# Patient Record
Sex: Male | Born: 1955 | Race: White | Hispanic: No | Smoking: Current every day smoker
Health system: Southern US, Community
[De-identification: ages and names within clinical notes are randomized; demographics above are authoritative.]

## PROBLEM LIST (undated history)

## (undated) DIAGNOSIS — I219 Acute myocardial infarction, unspecified: Secondary | ICD-10-CM

## (undated) DIAGNOSIS — N529 Male erectile dysfunction, unspecified: Secondary | ICD-10-CM

## (undated) DIAGNOSIS — I10 Essential (primary) hypertension: Secondary | ICD-10-CM

## (undated) DIAGNOSIS — M519 Unspecified thoracic, thoracolumbar and lumbosacral intervertebral disc disorder: Secondary | ICD-10-CM

## (undated) DIAGNOSIS — J449 Chronic obstructive pulmonary disease, unspecified: Secondary | ICD-10-CM

## (undated) DIAGNOSIS — Z227 Latent tuberculosis: Secondary | ICD-10-CM

## (undated) HISTORY — DX: Unspecified thoracic, thoracolumbar and lumbosacral intervertebral disc disorder: M51.9

## (undated) HISTORY — DX: Chronic obstructive pulmonary disease, unspecified: J44.9

## (undated) HISTORY — DX: Male erectile dysfunction, unspecified: N52.9

## (undated) HISTORY — DX: Essential (primary) hypertension: I10

## (undated) HISTORY — PX: APPENDECTOMY: SHX54

---

## 2005-05-18 ENCOUNTER — Emergency Department: Payer: Self-pay | Admitting: Emergency Medicine

## 2005-08-18 ENCOUNTER — Ambulatory Visit: Payer: Self-pay | Admitting: Specialist

## 2005-10-01 ENCOUNTER — Ambulatory Visit: Payer: Self-pay | Admitting: Anesthesiology

## 2005-10-08 ENCOUNTER — Ambulatory Visit: Payer: Self-pay | Admitting: Anesthesiology

## 2005-12-09 ENCOUNTER — Ambulatory Visit: Payer: Self-pay | Admitting: Anesthesiology

## 2006-01-26 ENCOUNTER — Ambulatory Visit: Payer: Self-pay | Admitting: Anesthesiology

## 2006-02-23 ENCOUNTER — Ambulatory Visit: Payer: Self-pay | Admitting: Anesthesiology

## 2006-03-30 ENCOUNTER — Ambulatory Visit: Payer: Self-pay | Admitting: Anesthesiology

## 2006-07-20 ENCOUNTER — Ambulatory Visit: Payer: Self-pay | Admitting: Anesthesiology

## 2007-09-01 ENCOUNTER — Ambulatory Visit: Payer: Self-pay | Admitting: Internal Medicine

## 2007-09-01 DIAGNOSIS — R55 Syncope and collapse: Secondary | ICD-10-CM | POA: Insufficient documentation

## 2007-09-01 DIAGNOSIS — J449 Chronic obstructive pulmonary disease, unspecified: Secondary | ICD-10-CM | POA: Insufficient documentation

## 2007-09-01 DIAGNOSIS — N529 Male erectile dysfunction, unspecified: Secondary | ICD-10-CM | POA: Insufficient documentation

## 2007-09-01 DIAGNOSIS — M5137 Other intervertebral disc degeneration, lumbosacral region: Secondary | ICD-10-CM | POA: Insufficient documentation

## 2007-09-01 DIAGNOSIS — R079 Chest pain, unspecified: Secondary | ICD-10-CM | POA: Insufficient documentation

## 2007-09-01 DIAGNOSIS — R42 Dizziness and giddiness: Secondary | ICD-10-CM | POA: Insufficient documentation

## 2007-09-02 LAB — CONVERTED CEMR LAB
AST: 26 units/L (ref 0–37)
BUN: 13 mg/dL (ref 6–23)
Basophils Absolute: 0.1 10*3/uL (ref 0.0–0.1)
Bilirubin, Direct: 0.1 mg/dL (ref 0.0–0.3)
Calcium: 9.5 mg/dL (ref 8.4–10.5)
Creatinine, Ser: 1 mg/dL (ref 0.4–1.5)
GFR calc non Af Amer: 85 mL/min
HCT: 44.3 % (ref 39.0–52.0)
Hemoglobin: 15.5 g/dL (ref 13.0–17.0)
Lymphocytes Relative: 31.2 % (ref 12.0–46.0)
MCHC: 35 g/dL (ref 30.0–36.0)
Monocytes Absolute: 0.8 10*3/uL — ABNORMAL HIGH (ref 0.2–0.7)
Monocytes Relative: 9.4 % (ref 3.0–11.0)
Neutro Abs: 4.8 10*3/uL (ref 1.4–7.7)
Neutrophils Relative %: 57.2 % (ref 43.0–77.0)
Phosphorus: 4.1 mg/dL (ref 2.3–4.6)
Potassium: 4.7 meq/L (ref 3.5–5.1)
RDW: 12.8 % (ref 11.5–14.6)
TSH: 0.85 microintl units/mL (ref 0.35–5.50)
Total Bilirubin: 0.7 mg/dL (ref 0.3–1.2)

## 2007-09-08 ENCOUNTER — Encounter: Payer: Self-pay | Admitting: Internal Medicine

## 2007-09-08 ENCOUNTER — Ambulatory Visit: Payer: Self-pay

## 2007-09-22 ENCOUNTER — Telehealth: Payer: Self-pay | Admitting: Internal Medicine

## 2008-02-01 ENCOUNTER — Ambulatory Visit: Payer: Self-pay | Admitting: Internal Medicine

## 2008-02-02 LAB — CONVERTED CEMR LAB
Prolactin: 3.2 ng/mL
Testosterone-% Free: 1.7 % (ref 1.6–2.9)

## 2008-02-20 ENCOUNTER — Encounter: Payer: Self-pay | Admitting: Internal Medicine

## 2011-06-09 ENCOUNTER — Encounter: Payer: Self-pay | Admitting: Internal Medicine

## 2011-06-10 ENCOUNTER — Encounter: Payer: Self-pay | Admitting: Internal Medicine

## 2011-06-10 ENCOUNTER — Ambulatory Visit (INDEPENDENT_AMBULATORY_CARE_PROVIDER_SITE_OTHER): Payer: PRIVATE HEALTH INSURANCE | Admitting: Internal Medicine

## 2011-06-10 VITALS — BP 130/80 | HR 66 | Temp 98.5°F | Ht 66.0 in | Wt 188.0 lb

## 2011-06-10 DIAGNOSIS — M5416 Radiculopathy, lumbar region: Secondary | ICD-10-CM | POA: Insufficient documentation

## 2011-06-10 DIAGNOSIS — IMO0002 Reserved for concepts with insufficient information to code with codable children: Secondary | ICD-10-CM

## 2011-06-10 MED ORDER — MELOXICAM 15 MG PO TABS: 15.0000 mg | ORAL_TABLET | Freq: Every day | ORAL | Status: AC

## 2011-06-10 MED ORDER — PREDNISONE 20 MG PO TABS: 40.0000 mg | ORAL_TABLET | Freq: Every day | ORAL | Status: AC

## 2011-06-10 NOTE — Patient Instructions (Signed)
Please get records from [facility] from about 5 years ago---back MRI and pain clinic Please call for referral if back is not better in 2-3 weeks

## 2011-06-10 NOTE — Progress Notes (Signed)
  Subjective:    Patient ID: Kurt [name], male    DOB: January 07, 1956, 55 y.o.   MRN: [id number]  HPI Injured back about 1 month ago--twisted wrong at work Seen at [facility] by Dr Lowry Bowl Similar injury 5 years ago--he needed epidural shots Norflex,lodine and hydrocodone Rx---used up the lodine and stopped others  Now with ongoing pain after just getting out of bed Can be severe and "drop me"---centered in low spine and then to left leg This may ease up but he has ongoing pain during the day and he has to be careful Uses back brace at times  Sleeps okay Able to work  occ weakness in left leg---may be walking and loses sensation and has to be careful. Not true weakness  No current outpatient prescriptions on file prior to visit.    No Known Allergies  Past Medical History  Diagnosis Date  . Lumbar disc disease   . COPD (chronic obstructive pulmonary disease)   . ED (erectile dysfunction)     Past Surgical History  Procedure Date  . Appendectomy     Family History  Problem Relation Age of Onset  . Hyperlipidemia Father   . Cancer Father   . Hypertension Neg Hx   . Diabetes Neg Hx     History   Social History  . Marital Status: Married    Spouse Name: N/A    Number of Children: 1  . Years of Education: N/A   Occupational History  . 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 Smoking status: Current Everyday Smoker  . Smokeless tobacco: Never Used  . Alcohol Use: Yes  . Drug Use: No  . Sexually Active: Not on file   Other Topics Concern  . Not on file   Social History Narrative  . No narrative on file   Review of Systems No bladder problems Some constipation on the meds--took stool softeners for a while    Objective:   Physical Exam  Constitutional: He appears well-developed and well-nourished. No distress.  Musculoskeletal:       Tenderness ~L4 over spine No hip tenderness SLR positive on both sides at ~45  degrees No sig decrease in ROM at hips Very limited back flexion  Neurological:       Slight weakness in knee extenders on left (may be pain related) Reflexes mixed---left knee slightly decreased but ankle is increased compared to right          Assessment & Plan:

## 2011-06-10 NOTE — Assessment & Plan Note (Signed)
Known severe disc disease in lumbar spine MRI and then epidurals at [facility] in past---I will try to review these records PE suggests nerve impingement but he is still functional  P: will restart NSAIDS--meloxicam      Trial of prednisone      Consider referral back for epidurals if not better

## 2013-02-26 ENCOUNTER — Emergency Department: Payer: Self-pay | Admitting: Emergency Medicine

## 2013-02-26 LAB — CBC
HGB: 14.9 g/dL (ref 13.0–18.0)
MCV: 95 fL (ref 80–100)
Platelet: 204 10*3/uL (ref 150–440)
RBC: 4.67 10*6/uL (ref 4.40–5.90)
RDW: 13.2 % (ref 11.5–14.5)

## 2013-02-26 LAB — TROPONIN I: Troponin-I: <0.02 ng/mL

## 2013-02-26 LAB — BASIC METABOLIC PANEL
Anion Gap: 6 — ABNORMAL LOW (ref 7–16)
BUN: 13 mg/dL (ref 7–18)
Calcium, Total: 8.5 mg/dL (ref 8.5–10.1)
Chloride: 105 mmol/L (ref 98–107)
Creatinine: 0.96 mg/dL (ref 0.60–1.30)
EGFR (Non-African Amer.): >60
Osmolality: 276 (ref 275–301)
Sodium: 138 mmol/L (ref 136–145)

## 2013-03-22 ENCOUNTER — Encounter: Payer: Self-pay | Admitting: Internal Medicine

## 2013-03-22 ENCOUNTER — Ambulatory Visit: Payer: Self-pay | Admitting: Internal Medicine

## 2013-03-22 ENCOUNTER — Ambulatory Visit (INDEPENDENT_AMBULATORY_CARE_PROVIDER_SITE_OTHER): Payer: PRIVATE HEALTH INSURANCE | Admitting: Internal Medicine

## 2013-03-22 VITALS — BP 150/98 | HR 68 | Temp 98.4°F | Wt 193.0 lb

## 2013-03-22 DIAGNOSIS — R030 Elevated blood-pressure reading, without diagnosis of hypertension: Secondary | ICD-10-CM

## 2013-03-22 DIAGNOSIS — R0781 Pleurodynia: Secondary | ICD-10-CM | POA: Insufficient documentation

## 2013-03-22 DIAGNOSIS — R071 Chest pain on breathing: Secondary | ICD-10-CM

## 2013-03-22 NOTE — Addendum Note (Signed)
Addended by: Tillman Abide I on: 03/22/2013 11:28 AM   Modules accepted: Orders

## 2013-03-22 NOTE — Assessment & Plan Note (Signed)
Ongoing pain Had CT abnormalities that were vague Will need to follow up on this Will repeat contrast CT Thoracic surgeon if biopsy indicated  Pulmonary consult if still vague Consider PET scan also Doesn't seem infectious so will hold off on antibiotics

## 2013-03-22 NOTE — Progress Notes (Signed)
  Subjective:    Patient ID: Kurt [name], male    DOB: 10-06-1956, 57 y.o.   MRN: [id number]  HPI Hasn't been seen in a while  Reviewed ER records from [facility] Still getting pleuritic pain with deep breath on the left side-- then to ER after sharp pain to left neck also No regular cough---just a little in the morning (especially since stopping smoking) EKG was normal CT of chest showed no PE but some vague abnormalities  Quit smoking for 35 days Now down to 3 per day (1 after each meal) Had been at 2PPD Had used e-cigarettes but stopped them after ER visit Wearing nicotine patch  No current outpatient prescriptions on file prior to visit.   No current facility-administered medications on file prior to visit.    No Known Allergies  Past Medical History  Diagnosis Date  . Lumbar disc disease   . COPD (chronic obstructive pulmonary disease)   . ED (erectile dysfunction)     Past Surgical History  Procedure Laterality Date  . Appendectomy      Family History  Problem Relation Age of Onset  . Hyperlipidemia Father   . Cancer Father   . Hypertension Neg Hx   . Diabetes Neg Hx     History   Social History  . Marital Status: Married    Spouse Name: N/A    Number of Children: 1  . Years of Education: N/A   Occupational History  . 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 Smoking status: Current Every Day Smoker  . Smokeless tobacco: Never Used  . Alcohol Use: Yes  . Drug Use: No  . Sexually Active: Not on file   Other Topics Concern  . Not on file   Social History Narrative  . No narrative on file   Review of Systems No fever No SOB now--- did have some dyspnea when he went to ER No palpitations No edema    Objective:   Physical Exam  Constitutional: He appears well-developed and well-nourished. No distress.  Neck: Normal range of motion. Neck supple. No thyromegaly present.  Cardiovascular: Normal rate,  regular rhythm and normal heart sounds.  Exam reveals no gallop.   No murmur heard. Pulmonary/Chest: Effort normal. No respiratory distress. He has no wheezes. He has no rales. He exhibits no tenderness.  Decreased breath sounds but clear  Abdominal: Soft. He exhibits no mass. There is no tenderness.  Musculoskeletal: He exhibits no edema and no tenderness.  Lymphadenopathy:    He has no cervical adenopathy.  Psychiatric: He has a normal mood and affect. His behavior is normal.          Assessment & Plan:

## 2013-03-22 NOTE — Assessment & Plan Note (Signed)
BP Readings from Last 3 Encounters:  03/22/13 150/98  06/10/11 130/80  02/01/08 128/86   Was elevated again on right also Has anxiety about situation---won't rush to Rx for now Will set up PE in next few months

## 2013-05-22 ENCOUNTER — Encounter: Payer: Self-pay | Admitting: Internal Medicine

## 2013-05-22 ENCOUNTER — Ambulatory Visit (INDEPENDENT_AMBULATORY_CARE_PROVIDER_SITE_OTHER): Payer: 59 | Admitting: Internal Medicine

## 2013-05-22 VITALS — BP 160/98 | HR 76 | Temp 98.8°F | Wt 193.0 lb

## 2013-05-22 DIAGNOSIS — Z1211 Encounter for screening for malignant neoplasm of colon: Secondary | ICD-10-CM

## 2013-05-22 DIAGNOSIS — M5137 Other intervertebral disc degeneration, lumbosacral region: Secondary | ICD-10-CM

## 2013-05-22 DIAGNOSIS — J449 Chronic obstructive pulmonary disease, unspecified: Secondary | ICD-10-CM

## 2013-05-22 DIAGNOSIS — N529 Male erectile dysfunction, unspecified: Secondary | ICD-10-CM

## 2013-05-22 DIAGNOSIS — I10 Essential (primary) hypertension: Secondary | ICD-10-CM

## 2013-05-22 MED ORDER — TADALAFIL 20 MG PO TABS: 10.0000 mg | ORAL_TABLET | ORAL | Status: DC | PRN

## 2013-05-22 MED ORDER — LOSARTAN POTASSIUM-HCTZ 50-12.5 MG PO TABS: 1.0000 | ORAL_TABLET | Freq: Every day | ORAL | Status: DC

## 2013-05-22 NOTE — Assessment & Plan Note (Signed)
BP Readings from Last 3 Encounters:  05/22/13 160/98  03/22/13 150/98  06/10/11 130/80   Repeat 160/92 on the right Will start Rx

## 2013-05-22 NOTE — Assessment & Plan Note (Signed)
Ready to try meds again as this is worse

## 2013-05-22 NOTE — Progress Notes (Signed)
  Subjective:    Patient ID: Kurt [name], male    DOB: Oct 30, 1956, 57 y.o.   MRN: [id number]  HPI Here for follow up No recurrence of chest pain Breathing is okay No exercise---stable DOE (worse if he smokes more with visitors on weekend) No palpitations  Hasn't been able to quit smoking Still trying with wife Has cut back quite a bit---uses patch during the day and has limited to 3-4 per day Discussed quitting completely and he and wife plan to quit together No regular cough  Has regular back pain Uses advil prn 3 weeks ago he hit right calf very hard---had bad swelling for about a week Iced it then--has improved somewhat Now with some ankle swelling Wears boots for work  No current outpatient prescriptions on file prior to visit.   No current facility-administered medications on file prior to visit.    No Known Allergies  Past Medical History  Diagnosis Date  . Lumbar disc disease   . COPD (chronic obstructive pulmonary disease)   . ED (erectile dysfunction)     Past Surgical History  Procedure Laterality Date  . Appendectomy      Family History  Problem Relation Age of Onset  . Hyperlipidemia Father   . Cancer Father   . Hypertension Neg Hx   . Diabetes Neg Hx     History   Social History  . Marital Status: Married    Spouse Name: N/A    Number of Children: 1  . Years of Education: N/A   Occupational History  . 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 Smoking status: Current Every Day Smoker  . Smokeless tobacco: Never Used  . Alcohol Use: Yes  . Drug Use: No  . Sexually Active: Not on file   Other Topics Concern  . Not on file   Social History Narrative  . No narrative on file   Review of Systems No headaches    Objective:   Physical Exam  Constitutional: He appears well-developed and well-nourished. No distress.  Neck: Normal range of motion. Neck supple. No thyromegaly present.   Cardiovascular: Normal rate, regular rhythm and normal heart sounds.  Exam reveals no gallop.   No murmur heard. Pulmonary/Chest: Effort normal and breath sounds normal.  Musculoskeletal: He exhibits no tenderness.  1+ right ankle edema--- right calf is no longer swollen but I can see the area of past trauma  Lymphadenopathy:    He has no cervical adenopathy.  Psychiatric: He has a normal mood and affect. His behavior is normal.          Assessment & Plan:

## 2013-05-22 NOTE — Assessment & Plan Note (Signed)
Still gets DOE if he smokes more No Rx for now Must give up the rest of the cigarettes

## 2013-05-22 NOTE — Assessment & Plan Note (Signed)
Uses advil only occasionally

## 2013-07-10 ENCOUNTER — Ambulatory Visit (INDEPENDENT_AMBULATORY_CARE_PROVIDER_SITE_OTHER): Payer: 59 | Admitting: Internal Medicine

## 2013-07-10 ENCOUNTER — Encounter: Payer: Self-pay | Admitting: Internal Medicine

## 2013-07-10 VITALS — BP 128/70 | HR 84 | Temp 98.4°F | Wt 192.0 lb

## 2013-07-10 DIAGNOSIS — I10 Essential (primary) hypertension: Secondary | ICD-10-CM

## 2013-07-10 NOTE — Assessment & Plan Note (Signed)
BP Readings from Last 3 Encounters:  07/10/13 128/70  05/22/13 160/98  03/22/13 150/98   Good response No side effects Check met B

## 2013-07-10 NOTE — Patient Instructions (Addendum)
Call 1-800 QUIT NOW for help with stopping smoking

## 2013-07-10 NOTE — Progress Notes (Signed)
  Subjective:    Patient ID: Kurt [name], male    DOB: [birth year], 57 y.o.   MRN: [id number]  HPI Doing well on the BP med Doesn't check it at all No headaches Still has a smoker's cough--no change No chest pain No SOB  No exercise Does yard work--etc  Current Outpatient Prescriptions on File Prior to Visit  Medication Sig Dispense Refill  . losartan-hydrochlorothiazide (HYZAAR) 50-12.5 MG per tablet Take 1 tablet by mouth daily.  30 tablet  11  . tadalafil (CIALIS) 20 MG tablet Take 0.5-1 tablets (10-20 mg total) by mouth every other day as needed for erectile dysfunction.  3 tablet  11   No current facility-administered medications on file prior to visit.    No Known Allergies  Past Medical History  Diagnosis Date  . Lumbar disc disease   . COPD (chronic obstructive pulmonary disease)   . ED (erectile dysfunction)   . Hypertension     Past Surgical History  Procedure Laterality Date  . Appendectomy      Family History  Problem Relation Age of Onset  . Hyperlipidemia Father   . Cancer Father   . Hypertension Neg Hx   . Diabetes Neg Hx     History   Social History  . Marital Status: Married    Spouse Name: N/A    Number of Children: 1  . Years of Education: N/A   Occupational History  . 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 Smoking status: Current Every Day Smoker  . Smokeless tobacco: Never Used  . Alcohol Use: Yes  . Drug Use: No  . Sexually Active: Not on file   Other Topics Concern  . Not on file   Social History Narrative  . No narrative on file   Review of Systems Sleeps okay till bad tooth recently---has hole in it. Needs it pulled Appetite is fine Weight is stable Hasn't tried the cialis    Objective:   Physical Exam  Constitutional: He appears well-developed and well-nourished. No distress.  Neck: Normal range of motion. Neck supple. No thyromegaly present.  Cardiovascular: Normal  rate, regular rhythm and normal heart sounds.  Exam reveals no gallop.   No murmur heard. Pulmonary/Chest: Effort normal. No respiratory distress. He has no rales.  Slight squeaky rhonchi  Musculoskeletal: He exhibits no edema and no tenderness.  Lymphadenopathy:    He has no cervical adenopathy.          Assessment & Plan:

## 2013-07-11 LAB — BASIC METABOLIC PANEL
BUN: 19 mg/dL (ref 6–23)
CO2: 27 mEq/L (ref 19–32)
Calcium: 9.5 mg/dL (ref 8.4–10.5)
Glucose, Bld: 99 mg/dL (ref 70–99)
Potassium: 3.7 mEq/L (ref 3.5–5.1)
Sodium: 140 mEq/L (ref 135–145)

## 2013-07-12 ENCOUNTER — Encounter: Payer: Self-pay | Admitting: *Deleted

## 2013-07-19 ENCOUNTER — Encounter: Payer: 59 | Admitting: Internal Medicine

## 2013-08-02 ENCOUNTER — Ambulatory Visit (AMBULATORY_SURGERY_CENTER): Payer: 59

## 2013-08-02 VITALS — Ht 66.0 in | Wt 187.6 lb

## 2013-08-02 DIAGNOSIS — Z1211 Encounter for screening for malignant neoplasm of colon: Secondary | ICD-10-CM

## 2013-08-02 MED ORDER — SUPREP BOWEL PREP KIT 17.5-3.13-1.6 GM/177ML PO SOLN: 1.0000 | Freq: Once | ORAL | Status: DC

## 2013-08-15 ENCOUNTER — Encounter: Payer: Self-pay | Admitting: Internal Medicine

## 2013-08-15 ENCOUNTER — Ambulatory Visit (AMBULATORY_SURGERY_CENTER): Payer: 59 | Admitting: Internal Medicine

## 2013-08-15 VITALS — BP 131/80 | HR 76 | Temp 99.6°F | Resp 22 | Ht 66.0 in | Wt 187.0 lb

## 2013-08-15 DIAGNOSIS — Z1211 Encounter for screening for malignant neoplasm of colon: Secondary | ICD-10-CM

## 2013-08-15 DIAGNOSIS — K648 Other hemorrhoids: Secondary | ICD-10-CM

## 2013-08-15 DIAGNOSIS — D128 Benign neoplasm of rectum: Secondary | ICD-10-CM

## 2013-08-15 DIAGNOSIS — D126 Benign neoplasm of colon, unspecified: Secondary | ICD-10-CM

## 2013-08-15 MED ORDER — SODIUM CHLORIDE 0.9 % IV SOLN: 500.0000 mL | INTRAVENOUS | Status: DC

## 2013-08-15 NOTE — Op Note (Signed)
[facility] [address] [place], [address]   COLONOSCOPY PROCEDURE REPORT  PATIENT: [name], Kurt  MR#: [id number] BIRTHDATE: [birth year] , 56  yrs. old GENDER: Male ENDOSCOPIST: Iva Boop, MD, [facility] REFERRED XB:[id number] Alphonsus Sias, M.D. PROCEDURE DATE:  08/15/2013 PROCEDURE:   Colonoscopy with biopsy First Screening Colonoscopy - Avg.  risk and is 50 yrs.  old or older Yes.  Prior Negative Screening - Now for repeat screening. N/A  History of Adenoma - Now for follow-up colonoscopy & has been > or = to 3 yrs.  N/A  Polyps Removed Today? Yes. ASA CLASS:   Class II INDICATIONS:average risk screening and first colonoscopy. MEDICATIONS: Propofol (Diprivan) 270 mg IV  DESCRIPTION OF PROCEDURE:   After the risks benefits and alternatives of the procedure were thoroughly explained, informed consent was obtained.  A digital rectal exam revealed no abnormalities of the rectum, A digital rectal exam revealed no prostatic nodules, and A digital rectal exam revealed the prostate was not enlarged.   The LB [id number]  endoscope was introduced through the anus and advanced to the cecum, which was identified by both the appendix and ileocecal valve. No adverse events experienced.   The quality of the prep was Suprep adequate The instrument was then slowly withdrawn as the colon was fully examined.    COLON FINDINGS: Four diminutive sessile polyps were found in the rectum.  A polypectomy was performed with cold forceps.  The resection was complete and the polyp tissue was completely retrieved.   The colon mucosa was otherwise normal.   A right colon retroflexion was performed.  Retroflexed views revealed internal hemorrhoids. The time to cecum=1 minutes 23 seconds.  Withdrawal time=9 minutes 48 seconds.  The scope was withdrawn and the procedure completed. COMPLICATIONS: There were no complications.  ENDOSCOPIC IMPRESSION: 1.   Four diminutive sessile polyps  were found in the rectum; polypectomy was performed with cold forceps 2.   The colon mucosa was otherwise normal - adequate prep 3.   Internal hemorrhoids  RECOMMENDATIONS: 1.  Timing of repeat colonoscopy will be determined by pathology findings. 2.   Consider hemorrhoid banding if they are symptomatic Routine annual hemoccults not indicated after colonoscopy  eSigned:  Iva Boop, MD, Clementeen Graham 08/15/2013 2:13 PM cc: Karie Schwalbe, MD and The Patient

## 2013-08-15 NOTE — Patient Instructions (Addendum)
I found and removed 4 small polyps from the rectum. They look benign.  I will let you know pathology results and when to have another routine colonoscopy by mail.  If you have hemorrhoid problems (swelling, itching, bleeding) I am able to treat those with an in-office procedure. If you like, please call my office at [phone] to schedule an appointment and I can evaluate you further. A handout is provided.  I appreciate the opportunity to care for you. Iva Boop, MD, FACG  YOU HAD AN ENDOSCOPIC PROCEDURE TODAY AT THE [facility]: Refer to the procedure report that was given to you for any specific questions about what was found during the examination.  If the procedure report does not answer your questions, please call your gastroenterologist to clarify.  If you requested that your care partner not be given the details of your procedure findings, then the procedure report has been included in a sealed envelope for you to review at your convenience later.  YOU SHOULD EXPECT: Some feelings of bloating in the abdomen. Passage of more gas than usual.  Walking can help get rid of the air that was put into your GI tract during the procedure and reduce the bloating. If you had a lower endoscopy (such as a colonoscopy or flexible sigmoidoscopy) you may notice spotting of blood in your stool or on the toilet paper. If you underwent a bowel prep for your procedure, then you may not have a normal bowel movement for a few days.  DIET: Your first meal following the procedure should be a light meal and then it is ok to progress to your normal diet.  A half-sandwich or bowl of soup is an example of a good first meal.  Heavy or fried foods are harder to digest and may make you feel nauseous or bloated.  Likewise meals heavy in dairy and vegetables can cause extra gas to form and this can also increase the bloating.  Drink plenty of fluids but you should avoid alcoholic beverages for 24  hours.  ACTIVITY: Your care partner should take you home directly after the procedure.  You should plan to take it easy, moving slowly for the rest of the day.  You can resume normal activity the day after the procedure however you should NOT DRIVE or use heavy machinery for 24 hours (because of the sedation medicines used during the test).    SYMPTOMS TO REPORT IMMEDIATELY: A gastroenterologist can be reached at any hour.  During normal business hours, 8:30 AM to 5:00 PM Monday through Friday, call [phone].  After hours and on weekends, please call the GI answering service at [phone] who will take a message and have the physician on call contact you.   Following lower endoscopy (colonoscopy or flexible sigmoidoscopy):  Excessive amounts of blood in the stool  Significant tenderness or worsening of abdominal pains  Swelling of the abdomen that is new, acute  Fever of 100F or higher  FOLLOW UP: If any biopsies were taken you will be contacted by phone or by letter within the next 1-3 weeks.  Call your gastroenterologist if you have not heard about the biopsies in 3 weeks.  Our staff will call the home number listed on your records the next business day following your procedure to check on you and address any questions or concerns that you may have at that time regarding the information given to you following your procedure. This is a courtesy call and  so if there is no answer at the home number and we have not heard from you through the emergency physician on call, we will assume that you have returned to your regular daily activities without incident.  SIGNATURES/CONFIDENTIALITY: You and/or your care partner have signed paperwork which will be entered into your electronic medical record.  These signatures attest to the fact that that the information above on your After Visit Summary has been reviewed and is understood.  Full responsibility of the confidentiality of this discharge  information lies with you and/or your care-partner.

## 2013-08-15 NOTE — Progress Notes (Addendum)
Patient did not have preoperative order for IV antibiotic SSI prophylaxis. (G8918)  Patient did not experience any of the following events: a burn prior to discharge; a fall within the facility; wrong site/side/patient/procedure/implant event; or a hospital transfer or hospital admission upon discharge from the facility. (G8907)  

## 2013-08-15 NOTE — Progress Notes (Signed)
Called to room to assist during endoscopic procedure.  Patient ID and intended procedure confirmed with present staff. Received instructions for my participation in the procedure from the performing physician.  

## 2013-08-15 NOTE — Progress Notes (Signed)
Report to pacu, rn, vss, bbs=clear 

## 2013-08-16 ENCOUNTER — Telehealth: Payer: Self-pay | Admitting: *Deleted

## 2013-08-16 NOTE — Telephone Encounter (Signed)
  Follow up Call-  Call back number 08/15/2013  Post procedure Call Back phone  # [phone]  Permission to leave phone message Yes     Patient questions:  Do you have a fever, pain , or abdominal swelling? no Pain Score  0 *  Have you tolerated food without any problems? yes  Have you been able to return to your normal activities? yes  Do you have any questions about your discharge instructions: Diet   no Medications  no Follow up visit  no  Do you have questions or concerns about your Care? no  Actions: * If pain score is 4 or above: No action needed, pain <4.

## 2013-08-25 ENCOUNTER — Encounter: Payer: Self-pay | Admitting: Internal Medicine

## 2013-08-25 NOTE — Progress Notes (Signed)
Quick Note:  4 diminutive rectal hyperplastic polyps = negative screening - not pre-cancerous Repeat colon 2024 ______

## 2014-01-15 ENCOUNTER — Encounter: Payer: 59 | Admitting: Internal Medicine

## 2014-01-29 ENCOUNTER — Encounter: Payer: 59 | Admitting: Internal Medicine

## 2014-06-12 ENCOUNTER — Ambulatory Visit (INDEPENDENT_AMBULATORY_CARE_PROVIDER_SITE_OTHER): Payer: 59 | Admitting: Internal Medicine

## 2014-06-12 ENCOUNTER — Encounter: Payer: Self-pay | Admitting: Internal Medicine

## 2014-06-12 VITALS — BP 120/80 | HR 68 | Temp 98.0°F | Ht 66.0 in | Wt 188.0 lb

## 2014-06-12 DIAGNOSIS — Z23 Encounter for immunization: Secondary | ICD-10-CM

## 2014-06-12 DIAGNOSIS — Z0000 Encounter for general adult medical examination without abnormal findings: Secondary | ICD-10-CM | POA: Insufficient documentation

## 2014-06-12 DIAGNOSIS — I10 Essential (primary) hypertension: Secondary | ICD-10-CM

## 2014-06-12 DIAGNOSIS — J449 Chronic obstructive pulmonary disease, unspecified: Secondary | ICD-10-CM

## 2014-06-12 LAB — CBC WITH DIFFERENTIAL/PLATELET
BASOS ABS: 0 10*3/uL (ref 0.0–0.1)
Basophils Relative: 0.4 % (ref 0.0–3.0)
EOS PCT: 1.7 % (ref 0.0–5.0)
Eosinophils Absolute: 0.2 10*3/uL (ref 0.0–0.7)
HCT: 46.1 % (ref 39.0–52.0)
HEMOGLOBIN: 15.5 g/dL (ref 13.0–17.0)
Lymphocytes Relative: 29.4 % (ref 12.0–46.0)
Lymphs Abs: 2.8 10*3/uL (ref 0.7–4.0)
MCHC: 33.5 g/dL (ref 30.0–36.0)
MCV: 95.3 fl (ref 78.0–100.0)
MONO ABS: 0.8 10*3/uL (ref 0.1–1.0)
Monocytes Relative: 8.6 % (ref 3.0–12.0)
Neutro Abs: 5.7 10*3/uL (ref 1.4–7.7)
Neutrophils Relative %: 59.9 % (ref 43.0–77.0)
PLATELETS: 260 10*3/uL (ref 150.0–400.0)
RBC: 4.84 Mil/uL (ref 4.22–5.81)
RDW: 13.6 % (ref 11.5–15.5)
WBC: 9.6 10*3/uL (ref 4.0–10.5)

## 2014-06-12 MED ORDER — LOSARTAN POTASSIUM-HCTZ 50-12.5 MG PO TABS: 1.0000 | ORAL_TABLET | Freq: Every day | ORAL | Status: DC

## 2014-06-12 NOTE — Assessment & Plan Note (Signed)
Discussed PSA--he prefers not to check Recent colon Info to quit smoking given

## 2014-06-12 NOTE — Progress Notes (Signed)
Pre visit review using our clinic review tool, if applicable. No additional management support is needed unless otherwise documented below in the visit note. 

## 2014-06-12 NOTE — Assessment & Plan Note (Signed)
BP Readings from Last 3 Encounters:  06/12/14 120/80  08/15/13 131/80  07/10/13 128/70   Good control Due for labs

## 2014-06-12 NOTE — Patient Instructions (Signed)
Please call 1-800 QUIT NOW for advice about stopping smoking.

## 2014-06-12 NOTE — Addendum Note (Signed)
Addended by: Despina Hidden on: 06/12/2014 12:09 PM   Modules accepted: Orders

## 2014-06-12 NOTE — Progress Notes (Signed)
Subjective:    Patient ID: Billal Rollo, male    DOB: 1956/10/15, 58 y.o.   MRN: [id number]  HPI Here for physical  Still smoking Using patch and has cut down--- does okay in weekdays Tends to smoke more on weekends when he drinks some  Some cough-worse when he stops for a while---discussed this Stable DOE  No recent vertigo No back pain recently  Current Outpatient Prescriptions on File Prior to Visit  Medication Sig Dispense Refill  . nicotine (NICODERM CQ - DOSED IN MG/24 HOURS) 14 mg/24hr patch Place 1 patch onto the skin daily.      . tadalafil (CIALIS) 20 MG tablet Take 0.5-1 tablets (10-20 mg total) by mouth every other day as needed for erectile dysfunction.  3 tablet  11   No current facility-administered medications on file prior to visit.    No Known Allergies  Past Medical History  Diagnosis Date  . Lumbar disc disease   . COPD (chronic obstructive pulmonary disease)   . ED (erectile dysfunction)   . Hypertension     Past Surgical History  Procedure Laterality Date  . Appendectomy      Family History  Problem Relation Age of Onset  . Hyperlipidemia Father   . Cancer Father   . Hypertension Neg Hx   . Diabetes Neg Hx   . Colon cancer Neg Hx   . Colon polyps Neg Hx   . Pancreatic cancer Neg Hx   . Rectal cancer Neg Hx   . Stomach cancer Neg Hx     History   Social History  . Marital Status: Married    Spouse Name: N/A    Number of Children: 1  . Years of Education: N/A   Occupational History  . 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 Smoking status: Current Every Day Smoker  . Smokeless tobacco: Never Used  . Alcohol Use: Yes  . Drug Use: No  . Sexual Activity: Not on file   Other Topics Concern  . Not on file   Social History Narrative  . No narrative on file   Review of Systems  Constitutional: Negative for fatigue and unexpected weight change.       Wears seat belt  HENT:  Positive for dental problem. Negative for hearing loss and tinnitus.        Expects to need all remaining teeth extracted  Eyes: Negative for visual disturbance.       No diplopia or unilateral vision loss  Respiratory: Positive for cough and shortness of breath. Negative for chest tightness.   Cardiovascular: Negative for chest pain, palpitations and leg swelling.  Gastrointestinal: Negative for nausea, vomiting, abdominal pain, constipation and blood in stool.       No heartburn  Endocrine: Positive for cold intolerance. Negative for heat intolerance.  Genitourinary: Negative for urgency, frequency and difficulty urinating.       Hasn't tried the cialis  Musculoskeletal: Positive for arthralgias. Negative for back pain.       Right 3rd finger will lock  Skin: Negative for rash.       Photosensitivity--- blisters with exposure (tries to protect but he works out on golf course)  Allergic/Immunologic: Negative for environmental allergies and immunocompromised state.  Neurological: Negative for dizziness, syncope, weakness, light-headedness, numbness and headaches.  Hematological: Negative for adenopathy. Does not bruise/bleed easily.  Psychiatric/Behavioral: Negative for sleep disturbance and dysphoric mood. The patient is not nervous/anxious.  Objective:   Physical Exam  Constitutional: He is oriented to person, place, and time. He appears well-developed and well-nourished. No distress.  HENT:  Head: Normocephalic and atraumatic.  Right Ear: External ear normal.  Left Ear: External ear normal.  Mouth/Throat: Oropharynx is clear and moist. No oropharyngeal exudate.  Eyes: Conjunctivae and EOM are normal. Pupils are equal, round, and reactive to light.  Neck: Normal range of motion. Neck supple. No thyromegaly present.  Cardiovascular: Normal rate, regular rhythm, normal heart sounds and intact distal pulses.  Exam reveals no gallop.   No murmur heard. Pulmonary/Chest: Effort  normal. No respiratory distress. He has no wheezes. He has no rales.  Decreased breath sounds but clear  Abdominal: Soft. He exhibits no distension. There is no tenderness. There is no rebound and no guarding.  Musculoskeletal: He exhibits no edema and no tenderness.  Lymphadenopathy:    He has no cervical adenopathy.  Neurological: He is alert and oriented to person, place, and time.  Skin: No rash noted. No erythema.  Psychiatric: He has a normal mood and affect. His behavior is normal.          Assessment & Plan:

## 2014-06-12 NOTE — Assessment & Plan Note (Signed)
Discussed cigarette cessation Symptoms don't warrant meds yet Will give pneumovax

## 2014-06-13 ENCOUNTER — Telehealth: Payer: Self-pay | Admitting: Internal Medicine

## 2014-06-13 ENCOUNTER — Encounter: Payer: Self-pay | Admitting: *Deleted

## 2014-06-13 LAB — COMPREHENSIVE METABOLIC PANEL
ALBUMIN: 3.9 g/dL (ref 3.5–5.2)
ALT: 31 U/L (ref 0–53)
AST: 27 U/L (ref 0–37)
Alkaline Phosphatase: 63 U/L (ref 39–117)
BUN: 18 mg/dL (ref 6–23)
CALCIUM: 9.3 mg/dL (ref 8.4–10.5)
CHLORIDE: 104 meq/L (ref 96–112)
CO2: 33 meq/L — AB (ref 19–32)
Creatinine, Ser: 1 mg/dL (ref 0.4–1.5)
GFR: 84.16 mL/min (ref >60.00)
GLUCOSE: 80 mg/dL (ref 70–99)
POTASSIUM: 4.7 meq/L (ref 3.5–5.1)
SODIUM: 140 meq/L (ref 135–145)
TOTAL PROTEIN: 7.1 g/dL (ref 6.0–8.3)
Total Bilirubin: 0.5 mg/dL (ref 0.2–1.2)

## 2014-06-13 LAB — LIPID PANEL
Cholesterol: 159 mg/dL (ref 0–200)
HDL: 34.5 mg/dL — ABNORMAL LOW (ref >39.00)
LDL Cholesterol: 82 mg/dL (ref 0–99)
NONHDL: 124.5
Total CHOL/HDL Ratio: 5
Triglycerides: 213 mg/dL — ABNORMAL HIGH (ref 0.0–149.0)
VLDL: 42.6 mg/dL — AB (ref 0.0–40.0)

## 2014-06-13 LAB — T4, FREE: FREE T4: 1.14 ng/dL (ref 0.60–1.60)

## 2014-06-13 NOTE — Telephone Encounter (Signed)
Relevant patient education assigned to patient using Emmi. ° °

## 2014-10-29 ENCOUNTER — Telehealth: Payer: Self-pay | Admitting: Internal Medicine

## 2014-10-29 MED ORDER — VARENICLINE TARTRATE 1 MG PO TABS: 1.0000 mg | ORAL_TABLET | Freq: Two times a day (BID) | ORAL | Status: DC

## 2014-10-29 MED ORDER — VARENICLINE TARTRATE 0.5 MG X 11 & 1 MG X 42 PO MISC: ORAL | Status: DC

## 2014-10-29 NOTE — Telephone Encounter (Signed)
Wife in Can't stop smoking unless he does  Will try both with chantix

## 2015-06-18 ENCOUNTER — Encounter: Payer: 59 | Admitting: Internal Medicine

## 2015-06-29 ENCOUNTER — Other Ambulatory Visit: Payer: Self-pay | Admitting: Internal Medicine

## 2015-07-05 ENCOUNTER — Ambulatory Visit (INDEPENDENT_AMBULATORY_CARE_PROVIDER_SITE_OTHER): Payer: 59 | Admitting: Internal Medicine

## 2015-07-05 ENCOUNTER — Encounter: Payer: Self-pay | Admitting: Internal Medicine

## 2015-07-05 VITALS — BP 136/72 | HR 74 | Temp 98.4°F | Ht 66.0 in | Wt 187.0 lb

## 2015-07-05 DIAGNOSIS — Z0000 Encounter for general adult medical examination without abnormal findings: Secondary | ICD-10-CM

## 2015-07-05 DIAGNOSIS — I10 Essential (primary) hypertension: Secondary | ICD-10-CM | POA: Diagnosis not present

## 2015-07-05 DIAGNOSIS — J449 Chronic obstructive pulmonary disease, unspecified: Secondary | ICD-10-CM

## 2015-07-05 LAB — T4, FREE: Free T4: 1 ng/dL (ref 0.60–1.60)

## 2015-07-05 LAB — COMPREHENSIVE METABOLIC PANEL
ALK PHOS: 69 U/L (ref 39–117)
ALT: 40 U/L (ref 0–53)
AST: 31 U/L (ref 0–37)
Albumin: 4 g/dL (ref 3.5–5.2)
BILIRUBIN TOTAL: 0.6 mg/dL (ref 0.2–1.2)
BUN: 14 mg/dL (ref 6–23)
CO2: 32 mEq/L (ref 19–32)
CREATININE: 0.94 mg/dL (ref 0.40–1.50)
Calcium: 9.4 mg/dL (ref 8.4–10.5)
Chloride: 107 mEq/L (ref 96–112)
GFR: 87.97 mL/min (ref >60.00)
Glucose, Bld: 83 mg/dL (ref 70–99)
Potassium: 4.6 mEq/L (ref 3.5–5.1)
Sodium: 143 mEq/L (ref 135–145)
Total Protein: 7.2 g/dL (ref 6.0–8.3)

## 2015-07-05 LAB — CBC WITH DIFFERENTIAL/PLATELET
BASOS ABS: 0 10*3/uL (ref 0.0–0.1)
Basophils Relative: 0.4 % (ref 0.0–3.0)
EOS PCT: 0.8 % (ref 0.0–5.0)
Eosinophils Absolute: 0.1 10*3/uL (ref 0.0–0.7)
HCT: 47.7 % (ref 39.0–52.0)
HEMOGLOBIN: 16.1 g/dL (ref 13.0–17.0)
Lymphocytes Relative: 22.4 % (ref 12.0–46.0)
Lymphs Abs: 1.9 10*3/uL (ref 0.7–4.0)
MCHC: 33.8 g/dL (ref 30.0–36.0)
MCV: 96.3 fl (ref 78.0–100.0)
MONO ABS: 0.8 10*3/uL (ref 0.1–1.0)
Monocytes Relative: 9.8 % (ref 3.0–12.0)
Neutro Abs: 5.7 10*3/uL (ref 1.4–7.7)
Neutrophils Relative %: 66.6 % (ref 43.0–77.0)
Platelets: 227 10*3/uL (ref 150.0–400.0)
RBC: 4.95 Mil/uL (ref 4.22–5.81)
RDW: 13.6 % (ref 11.5–15.5)
WBC: 8.5 10*3/uL (ref 4.0–10.5)

## 2015-07-05 MED ORDER — LOSARTAN POTASSIUM-HCTZ 50-12.5 MG PO TABS: 1.0000 | ORAL_TABLET | Freq: Every day | ORAL | Status: DC

## 2015-07-05 NOTE — Assessment & Plan Note (Signed)
No Rx needed Discussed cigarette cessation again

## 2015-07-05 NOTE — Progress Notes (Signed)
Subjective:    Patient ID: Kurt [name], male    DOB: 03/24/1956, 59 y.o.   MRN: [id number]  HPI Here for physical  Wasn't able to tolerate the chantix Bad dreams on the full dose Discussed at least minimizing his smoking He and wife both under 1 PPD now (from 2.5)  Has lost his appetite over the past month ??from the heat Not hungry in AM--then no BM (which was his usual) Some increased fatigue--but nothing striking No weight loss Discussed CIN in AM with milk  Cough is only occasional Does have daily phlegm Not sick No dyspnea  No current outpatient prescriptions on file prior to visit.   No current facility-administered medications on file prior to visit.    No Known Allergies  Past Medical History  Diagnosis Date  . Lumbar disc disease   . COPD (chronic obstructive pulmonary disease)   . ED (erectile dysfunction)   . Hypertension     Past Surgical History  Procedure Laterality Date  . Appendectomy      Family History  Problem Relation Age of Onset  . Hyperlipidemia Father   . Cancer Father   . Heart disease Father     heart valve disease (from Agent Orange?)  . Hypertension Neg Hx   . Diabetes Neg Hx   . Colon cancer Neg Hx   . Colon polyps Neg Hx   . Pancreatic cancer Neg Hx   . Rectal cancer Neg Hx   . Stomach cancer Neg Hx   . Alcohol abuse Mother     History   Social History  . Marital Status: Married    Spouse Name: N/A  . Number of Children: 1  . Years of Education: N/A   Occupational History  . 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 Smoking status: Current Every Day Smoker  . Smokeless tobacco: Never Used  . Alcohol Use: Yes  . Drug Use: No  . Sexual Activity: Not on file   Other Topics Concern  . Not on file   Social History Narrative   Review of Systems  Constitutional: Positive for fatigue. Negative for unexpected weight change.       Wears seat belt  HENT: Positive  for dental problem. Negative for hearing loss and tinnitus.        Needs to have teeth pulled  Eyes: Negative for redness and visual disturbance.       No diplopia or unilateral vision loss  Respiratory: Positive for cough. Negative for chest tightness and shortness of breath.   Cardiovascular: Negative for chest pain, palpitations and leg swelling.  Gastrointestinal: Positive for constipation. Negative for nausea, vomiting, abdominal pain and blood in stool.  Endocrine: Negative for polydipsia and polyuria.  Genitourinary: Negative for urgency, frequency and difficulty urinating.       No sex-- no problem  Musculoskeletal: Positive for back pain. Negative for joint swelling and arthralgias.  Skin: Positive for rash.       Easy sun poisoning  Allergic/Immunologic: Negative for environmental allergies and immunocompromised state.  Neurological: Negative for dizziness, syncope, weakness, light-headedness and headaches.       Abnormal sensation briefly in left leg--related to radiculitis  Hematological: Negative for adenopathy. Does not bruise/bleed easily.  Psychiatric/Behavioral: Negative for sleep disturbance and dysphoric mood. The patient is not nervous/anxious.        Objective:   Physical Exam  Constitutional: He is oriented to person, place, and time.  He appears well-developed and well-nourished. No distress.  HENT:  Head: Normocephalic and atraumatic.  Right Ear: External ear normal.  Left Ear: External ear normal.  Mouth/Throat: Oropharynx is clear and moist. No oropharyngeal exudate.  Eyes: Conjunctivae and EOM are normal. Pupils are equal, round, and reactive to light.  Neck: Normal range of motion. Neck supple. No thyromegaly present.  Cardiovascular: Normal rate, regular rhythm, normal heart sounds and intact distal pulses.  Exam reveals no gallop.   No murmur heard. Pulmonary/Chest: Effort normal. No respiratory distress. He has no wheezes. He has no rales.  Decreased  breath sounds but clear  Abdominal: Soft. There is no tenderness.  Musculoskeletal: He exhibits no edema or tenderness.  Lymphadenopathy:    He has no cervical adenopathy.  Neurological: He is alert and oriented to person, place, and time.  Skin: No rash noted. No erythema.  Psychiatric: He has a normal mood and affect. His behavior is normal.          Assessment & Plan:

## 2015-07-05 NOTE — Assessment & Plan Note (Addendum)
Healthy other than lungs Discussed trying nicotine patch again-- but has to quit completely (and probably with wife) Some fatigue and appetite change probably from the heat--will check labs Colon 2014 Still prefers no prostate cancer screening

## 2015-07-05 NOTE — Progress Notes (Signed)
Pre visit review using our clinic review tool, if applicable. No additional management support is needed unless otherwise documented below in the visit note. 

## 2015-07-05 NOTE — Assessment & Plan Note (Signed)
BP Readings from Last 3 Encounters:  07/05/15 136/72  06/12/14 120/80  08/15/13 131/80   Good control No changes needed

## 2015-07-08 ENCOUNTER — Encounter: Payer: Self-pay | Admitting: *Deleted

## 2016-07-10 ENCOUNTER — Encounter: Payer: 59 | Admitting: Internal Medicine

## 2016-07-11 ENCOUNTER — Other Ambulatory Visit: Payer: Self-pay | Admitting: Internal Medicine

## 2016-09-12 ENCOUNTER — Other Ambulatory Visit: Payer: Self-pay | Admitting: Internal Medicine

## 2016-11-24 ENCOUNTER — Encounter: Payer: Self-pay | Admitting: Internal Medicine

## 2016-11-24 ENCOUNTER — Ambulatory Visit (INDEPENDENT_AMBULATORY_CARE_PROVIDER_SITE_OTHER): Payer: 59 | Admitting: Internal Medicine

## 2016-11-24 VITALS — BP 124/90 | HR 68 | Temp 98.0°F | Ht 66.0 in | Wt 182.0 lb

## 2016-11-24 DIAGNOSIS — Z0000 Encounter for general adult medical examination without abnormal findings: Secondary | ICD-10-CM

## 2016-11-24 DIAGNOSIS — K219 Gastro-esophageal reflux disease without esophagitis: Secondary | ICD-10-CM | POA: Diagnosis not present

## 2016-11-24 DIAGNOSIS — J449 Chronic obstructive pulmonary disease, unspecified: Secondary | ICD-10-CM | POA: Diagnosis not present

## 2016-11-24 DIAGNOSIS — I10 Essential (primary) hypertension: Secondary | ICD-10-CM | POA: Diagnosis not present

## 2016-11-24 DIAGNOSIS — Z23 Encounter for immunization: Secondary | ICD-10-CM

## 2016-11-24 LAB — COMPREHENSIVE METABOLIC PANEL
ALT: 27 U/L (ref 0–53)
AST: 22 U/L (ref 0–37)
Albumin: 4.2 g/dL (ref 3.5–5.2)
Alkaline Phosphatase: 75 U/L (ref 39–117)
BUN: 18 mg/dL (ref 6–23)
CALCIUM: 9.5 mg/dL (ref 8.4–10.5)
CHLORIDE: 104 meq/L (ref 96–112)
CO2: 31 meq/L (ref 19–32)
CREATININE: 1.01 mg/dL (ref 0.40–1.50)
GFR: 80.58 mL/min (ref >60.00)
GLUCOSE: 94 mg/dL (ref 70–99)
Potassium: 4.8 mEq/L (ref 3.5–5.1)
Sodium: 141 mEq/L (ref 135–145)
Total Bilirubin: 0.8 mg/dL (ref 0.2–1.2)
Total Protein: 7.5 g/dL (ref 6.0–8.3)

## 2016-11-24 LAB — CBC WITH DIFFERENTIAL/PLATELET
BASOS PCT: 0.4 % (ref 0.0–3.0)
Basophils Absolute: 0 10*3/uL (ref 0.0–0.1)
EOS ABS: 0.2 10*3/uL (ref 0.0–0.7)
Eosinophils Relative: 2 % (ref 0.0–5.0)
HEMATOCRIT: 49.6 % (ref 39.0–52.0)
Hemoglobin: 16.9 g/dL (ref 13.0–17.0)
LYMPHS ABS: 2.2 10*3/uL (ref 0.7–4.0)
LYMPHS PCT: 27.2 % (ref 12.0–46.0)
MCHC: 34 g/dL (ref 30.0–36.0)
MCV: 98.3 fl (ref 78.0–100.0)
MONOS PCT: 11.3 % (ref 3.0–12.0)
Monocytes Absolute: 0.9 10*3/uL (ref 0.1–1.0)
NEUTROS ABS: 4.8 10*3/uL (ref 1.4–7.7)
Neutrophils Relative %: 59.1 % (ref 43.0–77.0)
PLATELETS: 246 10*3/uL (ref 150.0–400.0)
RBC: 5.05 Mil/uL (ref 4.22–5.81)
RDW: 13.5 % (ref 11.5–15.5)
WBC: 8.2 10*3/uL (ref 4.0–10.5)

## 2016-11-24 NOTE — Assessment & Plan Note (Signed)
BP Readings from Last 3 Encounters:  11/24/16 124/90  07/05/15 136/72  06/12/14 120/80   Didn't take med this morning No change needed

## 2016-11-24 NOTE — Assessment & Plan Note (Signed)
Really has to stop smoking Discussed PSA--he wishes to defer Colon due 2024 Flu vaccine and prevnar today--discussed

## 2016-11-24 NOTE — Assessment & Plan Note (Signed)
Doesn't need Rx but again discussed importance of stopping smoking

## 2016-11-24 NOTE — Patient Instructions (Signed)
Please try over the counter omeprazole 20mg  daily for 2 weeks. If your symptoms are gone, you can stop and then only use it as needed.

## 2016-11-24 NOTE — Addendum Note (Signed)
Addended by: Pilar Grammes on: 11/24/2016 10:52 AM   Modules accepted: Orders

## 2016-11-24 NOTE — Progress Notes (Signed)
Pre visit review using our clinic review tool, if applicable. No additional management support is needed unless otherwise documented below in the visit note. 

## 2016-11-24 NOTE — Assessment & Plan Note (Signed)
Try PPI for 2 weeks.

## 2016-11-24 NOTE — Progress Notes (Signed)
Subjective:    Patient ID: Kurt [name], male    DOB: 1955-12-29, 60 y.o.   MRN: [id number]  HPI Here for physical  Having heartburn for the past week or so tums does help for a while No trouble swallowing Rare issues in past only No change in diet or night eating  Having problems with his vision Some early cataracts Now blurry in right eye  Still smoking--as is wife Discussed options--they are going to try again The patch did help him Only occasional cough--related to how much he smokes Some wheezing Gets SOB if he pushes it  No chest pain No palpitations No edema  no dizziness or syncope  Current Outpatient Prescriptions on File Prior to Visit  Medication Sig Dispense Refill  . losartan-hydrochlorothiazide (HYZAAR) 50-12.5 MG tablet Take 1 tablet by mouth daily. 30 tablet 2   No current facility-administered medications on file prior to visit.     No Known Allergies  Past Medical History:  Diagnosis Date  . COPD (chronic obstructive pulmonary disease) ([place])   . ED (erectile dysfunction)   . Hypertension   . Lumbar disc disease     Past Surgical History:  Procedure Laterality Date  . APPENDECTOMY      Family History  Problem Relation Age of Onset  . Hyperlipidemia Father   . Cancer Father   . Heart disease Father     heart valve disease (from Agent Orange?)  . Hypertension Neg Hx   . Diabetes Neg Hx   . Colon cancer Neg Hx   . Colon polyps Neg Hx   . Pancreatic cancer Neg Hx   . Rectal cancer Neg Hx   . Stomach cancer Neg Hx   . Alcohol abuse Mother     Social History   Social History  . Marital status: Married    Spouse name: N/A  . Number of children: 1  . Years of education: N/A   Occupational History  . 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 Smoking status: Current Every Day Smoker  . Smokeless tobacco: Never Used  . Alcohol use Yes  . Drug use: No  . Sexual activity: Not on file     Other Topics Concern  . Not on file   Social History Narrative  . No narrative on file   Review of Systems  Constitutional: Negative for fatigue and unexpected weight change.       Wears seat belt  HENT: Positive for dental problem. Negative for hearing loss and tinnitus.        Not really caring for his teeth  Eyes: Positive for visual disturbance.  Respiratory: Positive for cough, shortness of breath and wheezing.   Cardiovascular: Negative for chest pain, palpitations and leg swelling.  Gastrointestinal: Negative for abdominal pain, blood in stool and constipation.  Endocrine: Negative for polydipsia and polyuria.  Genitourinary: Negative for difficulty urinating, frequency and urgency.       No sex--no problem  Musculoskeletal: Negative for arthralgias and joint swelling.       Intermittent back pain--known disc disease. Wears brace prn  Skin: Negative for rash.       No suspicious lesions  Allergic/Immunologic: Negative for environmental allergies and immunocompromised state.  Neurological: Negative for dizziness, syncope, weakness, light-headedness and headaches.  Hematological: Negative for adenopathy. Does not bruise/bleed easily.  Psychiatric/Behavioral: Negative for dysphoric mood and sleep disturbance. The patient is not nervous/anxious.  Objective:   Physical Exam  Constitutional: He is oriented to person, place, and time. He appears well-developed and well-nourished. No distress.  HENT:  Head: Normocephalic and atraumatic.  Right Ear: External ear normal.  Left Ear: External ear normal.  Mouth/Throat: Oropharynx is clear and moist. No oropharyngeal exudate.  Eyes: Conjunctivae are normal. Pupils are equal, round, and reactive to light.  Neck: Normal range of motion. Neck supple. No thyromegaly present.  Cardiovascular: Normal rate, regular rhythm, normal heart sounds and intact distal pulses.  Exam reveals no gallop.   No murmur heard. Pulmonary/Chest:  Effort normal. No respiratory distress. He has no wheezes. He has no rales.  Decreased breath sounds with scattered rhonchi  Abdominal: Soft. There is no tenderness.  Musculoskeletal: He exhibits no edema or tenderness.  Lymphadenopathy:    He has no cervical adenopathy.  Neurological: He is alert and oriented to person, place, and time.  Skin: No rash noted. No erythema.  Psychiatric: He has a normal mood and affect. His behavior is normal.          Assessment & Plan:

## 2016-12-13 ENCOUNTER — Other Ambulatory Visit: Payer: Self-pay | Admitting: Internal Medicine

## 2017-07-26 ENCOUNTER — Encounter: Payer: Self-pay | Admitting: Internal Medicine

## 2017-07-26 ENCOUNTER — Ambulatory Visit (INDEPENDENT_AMBULATORY_CARE_PROVIDER_SITE_OTHER): Payer: 59 | Admitting: Internal Medicine

## 2017-07-26 VITALS — BP 124/70 | HR 71 | Temp 98.2°F | Wt 179.1 lb

## 2017-07-26 DIAGNOSIS — M5441 Lumbago with sciatica, right side: Secondary | ICD-10-CM | POA: Diagnosis not present

## 2017-07-26 MED ORDER — PREDNISONE 20 MG PO TABS: 40.0000 mg | ORAL_TABLET | Freq: Every day | ORAL | 0 refills | Status: DC

## 2017-07-26 MED ORDER — CYCLOBENZAPRINE HCL 10 MG PO TABS: 5.0000 mg | ORAL_TABLET | Freq: Every evening | ORAL | 0 refills | Status: DC | PRN

## 2017-07-26 NOTE — Progress Notes (Signed)
   Subjective:    Patient ID: Kurt [name], male    DOB: 1956-03-22, 61 y.o.   MRN: [id number]  HPI Here due to back pain  Doesn't remember any injury but just started some pain in low back 5 days ago (awoke with it) Low back and down right leg Tried heat and advil (600mg  bid)--some help Okay with standing but severe pain with twisting sensation when sitting or cough/sneeze Similar pain to ~13 years ago--ruptured disc (injection helped at that time)  No falls or new activities Wearing brace at work since this started  No leg weakness Limping due to the pain  Current Outpatient Prescriptions on File Prior to Visit  Medication Sig Dispense Refill  . aspirin 81 MG tablet Take 81 mg by mouth daily.    Marland Kitchen losartan-hydrochlorothiazide (HYZAAR) 50-12.5 MG tablet TAKE ONE TABLET BY MOUTH DAILY. 30 tablet 11   No current facility-administered medications on file prior to visit.     No Known Allergies  Past Medical History:  Diagnosis Date  . COPD (chronic obstructive pulmonary disease) ([place])   . ED (erectile dysfunction)   . Hypertension   . Lumbar disc disease     Past Surgical History:  Procedure Laterality Date  . APPENDECTOMY      Family History  Problem Relation Age of Onset  . Hyperlipidemia Father   . Cancer Father   . Heart disease Father        heart valve disease (from Agent Orange?)  . Hypertension Neg Hx   . Diabetes Neg Hx   . Colon cancer Neg Hx   . Colon polyps Neg Hx   . Pancreatic cancer Neg Hx   . Rectal cancer Neg Hx   . Stomach cancer Neg Hx   . Alcohol abuse Mother     Social History   Social History  . Marital status: Married    Spouse name: N/A  . Number of children: 1  . Years of education: N/A   Occupational History  . 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 Smoking status: Current Every Day Smoker  . Smokeless tobacco: Never Used  . Alcohol use Yes  . Drug use: No  . Sexual  activity: Not on file   Other Topics Concern  . Not on file   Social History Narrative  . No narrative on file   Review of Systems  No loss of bladder or bowel control No fever      Objective:   Physical Exam  Musculoskeletal:  No spine tenderness Has moderate lateral right lumbar tenderness Very limited back flexion SLR positive ipsilateral and contralateral  Neurological:  No clear focal weakness---pain with right leg movement Mildly antalgic gait after loosening up Normal tone          Assessment & Plan:

## 2017-07-26 NOTE — Patient Instructions (Signed)
Let me know if you are not improving by next week.

## 2017-07-26 NOTE — Assessment & Plan Note (Signed)
Could be from ruptured disc but can't tell now Will try prednisone Cyclobenzaprine for bedtime Continue the ibuprofen 600 tid Continue heat Referral to Dr Sharlet Salina if not better in 1-2 weeks

## 2017-07-28 ENCOUNTER — Emergency Department
Admission: EM | Admit: 2017-07-28 | Discharge: 2017-07-28 | Disposition: A | Discharge status: Home / Self Care | Payer: 59 | Attending: Emergency Medicine | Admitting: Emergency Medicine

## 2017-07-28 ENCOUNTER — Emergency Department: Payer: 59

## 2017-07-28 ENCOUNTER — Encounter: Payer: Self-pay | Admitting: Emergency Medicine

## 2017-07-28 DIAGNOSIS — M5441 Lumbago with sciatica, right side: Secondary | ICD-10-CM | POA: Insufficient documentation

## 2017-07-28 DIAGNOSIS — Z79899 Other long term (current) drug therapy: Secondary | ICD-10-CM | POA: Insufficient documentation

## 2017-07-28 DIAGNOSIS — J449 Chronic obstructive pulmonary disease, unspecified: Secondary | ICD-10-CM | POA: Diagnosis not present

## 2017-07-28 DIAGNOSIS — Z7982 Long term (current) use of aspirin: Secondary | ICD-10-CM | POA: Insufficient documentation

## 2017-07-28 DIAGNOSIS — F17200 Nicotine dependence, unspecified, uncomplicated: Secondary | ICD-10-CM | POA: Insufficient documentation

## 2017-07-28 DIAGNOSIS — I10 Essential (primary) hypertension: Secondary | ICD-10-CM | POA: Insufficient documentation

## 2017-07-28 DIAGNOSIS — M545 Low back pain: Secondary | ICD-10-CM | POA: Diagnosis present

## 2017-07-28 DIAGNOSIS — M5431 Sciatica, right side: Secondary | ICD-10-CM

## 2017-07-28 MED ORDER — IBUPROFEN 600 MG PO TABS: 600.0000 mg | ORAL_TABLET | Freq: Four times a day (QID) | ORAL | 0 refills | Status: DC | PRN

## 2017-07-28 MED ORDER — KETOROLAC TROMETHAMINE 60 MG/2ML IM SOLN
30.0000 mg | Freq: Once | INTRAMUSCULAR | Status: AC
  Administered 2017-07-28: 30 mg via INTRAMUSCULAR
  Filled 2017-07-28: qty 2

## 2017-07-28 MED ORDER — ACETAMINOPHEN 500 MG PO TABS
1000.0000 mg | ORAL_TABLET | Freq: Once | ORAL | Status: AC
  Administered 2017-07-28: 1000 mg via ORAL

## 2017-07-28 MED ORDER — TRAMADOL HCL 50 MG PO TABS: 50.0000 mg | ORAL_TABLET | Freq: Four times a day (QID) | ORAL | 0 refills | Status: DC | PRN

## 2017-07-28 MED ORDER — TRAMADOL HCL 50 MG PO TABS
50.0000 mg | ORAL_TABLET | Freq: Once | ORAL | Status: AC
  Administered 2017-07-28: 50 mg via ORAL
  Filled 2017-07-28: qty 1

## 2017-07-28 NOTE — ED Triage Notes (Signed)
Patient states that he has lower back pain radiating down right leg that started on Wednesday. Patient was seen by his PCP on Monday and started on Cyclobenzaprine and Prednisone. Patient states that his pain has not improved.

## 2017-07-28 NOTE — Discharge Instructions (Signed)
You have been seen in the Emergency Department (ED)  today for back pain.  Back pain has many possible causes some are related to muscles while others have more serious causes. Even though you were checked carefully today and your exam and evaluation were reassuring, problems may develop later or continue to unfold. Therefore it is imperative that you follow up with doctor closely for further evaluation.  Follow-up with your doctor in 1 day for further evaluation.  For pain control take: ibuprofen 600 mg every 6 hours, tylenol 1000mg  every 8 hours and tramadol 50mg  every 6 hours for breakthrough pain.  When should you call for help?  Call your doctor now or seek immediate medical care if:  You have new or worsening numbness in your legs.  You have new or worsening weakness in your legs. (This could make it hard to stand up.)  You lose control of your bladder or bowels or if you are unable to urinate. You have numbness of your groin or buttock region If you develop a fever  Watch closely for changes in your health, and be sure to contact your doctor if:  Your pain gets worse.  You are not getting better after 2 weeks.  How can you care for yourself at home?  Take pain medicines exactly as directed.  If the doctor gave you a prescription medicine for pain, take it as prescribed.  If you are not taking a prescription pain medicine, ask your doctor if you can take an over-the-counter medicine like tylenol or ibuprofen. Sit or lie in positions that are most comfortable and reduce your pain. Try one of these positions when you lie down:  Lie on your back with your knees bent and supported by large pillows.  Lie on the floor with your legs on the seat of a sofa or chair.  Lie on your side with your knees and hips bent and a pillow between your legs.  Lie on your stomach if it does not make pain worse. Do not sit up in bed, and avoid soft couches and twisted positions. Bed rest can help relieve  pain at first, but it delays healing. Avoid bed rest after the first day of back pain.  Change positions every 30 minutes. If you must sit for long periods of time, take breaks from sitting. Get up and walk around, or lie in a comfortable position.  Try using a heating pad on a low or medium setting for 15 to 20 minutes every 2 or 3 hours. Try a warm shower in place of one session with the heating pad.  You can also try an ice pack for 10 to 15 minutes every 2 to 3 hours. Put a thin cloth between the ice pack and your skin.  Take short walks several times a day. You can start with 5 to 10 minutes, 3 or 4 times a day, and work up to longer walks. Walk on level surfaces and avoid hills and stairs until your back is better.  Return to work and other activities as soon as you can. Continued rest without activity is usually not good for your back.  To prevent future back pain, do exercises to stretch and strengthen your back and stomach. Learn how to use good posture, safe lifting techniques, and proper body mechanics.

## 2017-07-28 NOTE — ED Notes (Signed)

## 2017-07-28 NOTE — ED Provider Notes (Signed)
[facility] Emergency Department Provider Note  ____________________________________________  Time seen: Approximately 8:38 PM  I have reviewed the triage vital signs and the nursing notes.   HISTORY  Chief Complaint Back Pain   HPI Kurt [name] is a 62 y.o. male with a history of lumbar disc disease, sciatica, COPD, and hypertension who presents for evaluation of back pain. Patient reports that he has had pain for 4 days. He woke up with this pain. The pain is sharp, located in the right lower back, radiating down his right lower leg. He denies any trauma or fall. He denies any urinary symptoms. He denies any abdominal pain. He denies any GU symptoms. He denies fever or chills, chest pain or shortness of breath.He denies saddle anesthesia, urinary or bowel incontinence or retention, lower extremity weakness or numbness. He reports that the pain is identical to his prior chronic back pain. He was treated with a steroid injection back then by an orthopedic surgeon. He saw his primary care doctor 3 days ago who put him on prednisone and Flexeril. He reports that his pain is unchanged.The pain is currently 8 out of 10 however every time he tries to stand up and the pain becomes severe. Patient is able to ambulate and bear weight.  Past Medical History:  Diagnosis Date  . COPD (chronic obstructive pulmonary disease) ([place])   . ED (erectile dysfunction)   . Hypertension   . Lumbar disc disease     Patient Active Problem List   Diagnosis Date Noted  . Acute back pain with sciatica, right 07/26/2017  . GERD (gastroesophageal reflux disease) 11/24/2016  . Routine general medical examination at a health care facility 06/12/2014  . Hypertension   . COPD with chronic bronchitis ([place]) 09/01/2007  . [place] DISEASE, LUMBAR 09/01/2007    Past Surgical History:  Procedure Laterality Date  . APPENDECTOMY      Prior to Admission medications   Medication Sig Start  Date End Date Taking? Authorizing Provider  aspirin 81 MG tablet Take 81 mg by mouth daily.    [provider]  cyclobenzaprine (FLEXERIL) 10 MG tablet Take 0.5-1 tablets (5-10 mg total) by mouth at bedtime as needed for muscle spasms. 07/26/17   Venia Carbon, MD  ibuprofen (ADVIL,MOTRIN) 600 MG tablet Take 1 tablet (600 mg total) by mouth every 6 (six) hours as needed. 07/28/17   Rudene Re, MD  losartan-hydrochlorothiazide (HYZAAR) 50-12.5 MG tablet TAKE ONE TABLET BY MOUTH DAILY. 12/14/16   Venia Carbon, MD  predniSONE (DELTASONE) 20 MG tablet Take 2 tablets (40 mg total) by mouth daily. For 3 days, then 1 daily for 3 days 07/26/17   Venia Carbon, MD  traMADol (ULTRAM) 50 MG tablet Take 1 tablet (50 mg total) by mouth every 6 (six) hours as needed. 07/28/17 07/28/18  Rudene Re, MD    Allergies Patient has no known allergies.  Family History  Problem Relation Age of Onset  . Hyperlipidemia Father   . Cancer Father   . Heart disease Father        heart valve disease (from Agent Orange?)  . Alcohol abuse Mother   . Hypertension Neg Hx   . Diabetes Neg Hx   . Colon cancer Neg Hx   . Colon polyps Neg Hx   . Pancreatic cancer Neg Hx   . Rectal cancer Neg Hx   . Stomach cancer Neg Hx     Social History Social History  Substance Use  Topics  . Smoking status: Current Every Day Smoker  . Smokeless tobacco: Never Used  . Alcohol use Yes    Review of Systems  Constitutional: Negative for fever. Eyes: Negative for visual changes. ENT: Negative for sore throat. Neck: No neck pain  Cardiovascular: Negative for chest pain. Respiratory: Negative for shortness of breath. Gastrointestinal: Negative for abdominal pain, vomiting or diarrhea. Genitourinary: Negative for dysuria. Musculoskeletal: + R lumbar back pain. Skin: Negative for rash. Neurological: Negative for headaches, weakness or numbness. Psych: No SI or  HI  ____________________________________________   PHYSICAL EXAM:  VITAL SIGNS: ED Triage Vitals  Enc Vitals Group     BP 07/28/17 2003 (!) 195/151     Pulse Rate 07/28/17 2001 80     Resp 07/28/17 2001 18     Temp 07/28/17 2001 97.8 F (36.6 C)     Temp Source 07/28/17 2001 Oral     SpO2 07/28/17 2001 95 %     Weight 07/28/17 2001 189 lb (85.7 kg)     Height 07/28/17 2001 5\' 6"  (1.676 m)     Head Circumference --      Peak Flow --      Pain Score 07/28/17 2001 9     Pain Loc --      Pain Edu? --      Excl. in [place]? --     Constitutional: Alert and oriented. Well appearing and in no apparent distress. HEENT:      Head: Normocephalic and atraumatic.         Eyes: Conjunctivae are normal. Sclera is non-icteric.       Mouth/Throat: Mucous membranes are moist.       Neck: Supple with no signs of meningismus. Cardiovascular: Regular rate and rhythm. No murmurs, gallops, or rubs. 2+ symmetrical distal pulses are present in all extremities. No JVD. Respiratory: Normal respiratory effort. Lungs are clear to auscultation bilaterally. No wheezes, crackles, or rhonchi.  Gastrointestinal: Soft, non tender, and non distended with positive bowel sounds. No rebound or guarding. Genitourinary: No CVA tenderness. Bilateral testicles are descended with no tenderness to palpation, bilateral positive cremasteric reflexes are present, no swelling or erythema of the scrotum. No evidence of inguinal hernia. Musculoskeletal: no tenderness to palpation on CT and L-spine, patient is tender to palpation on the muscles of the right lumbar region, positive straight leg raise test, DTRs are 1+ in bilateral lower extremities, normal sensation, normal shrinks, 2+ DP and PT pulses bilaterally with brisk capillary refill. Nontender with normal range of motion in all extremities. No edema, cyanosis, or erythema of extremities. Neurologic: Normal speech and language. Face is symmetric. Moving all extremities. No  gross focal neurologic deficits are appreciated. Skin: Skin is warm, dry and intact. No rash noted. Psychiatric: Mood and affect are normal. Speech and behavior are normal.  ____________________________________________   LABS (all labs ordered are listed, but only abnormal results are displayed)  Labs Reviewed - No data to display ____________________________________________  EKG  none  ____________________________________________  RADIOLOGY  XR lumbar: Degenerative changes. Mild retrolisthesis at L2-3 and L3-4.  ____________________________________________   PROCEDURES  Procedure(s) performed: None Procedures Critical Care performed:  None ____________________________________________   INITIAL IMPRESSION / ASSESSMENT AND PLAN / ED COURSE  61 y.o. male with a history of lumbar disc disease, sciatica, COPD, and hypertension who presents for evaluation of back pain x 4 days. Pain is similar to chronic back pain. Presentation concerning for sciatica pain. No signs or symptoms of cauda equina.  Strong distal pulses, intact capillary refill, normal strength, normal sensation,with no evidence of DVT, dissection The pain is extremely reproducible on palpation and with movement. Patient is severe pain every time he sits  down or stand up. Will give a shot of toradol, tylenol, tramadol and get XR of the lumbar spine.    _________________________ 9:45 PM on 07/28/2017 -----------------------------------------  Patient remains neurologically intact. X-rays showing degenerative changes. Patient's pain is markedly improved with IM Toradol,and tramadol. Patient be given a small prescription of tramadol, ibuprofen. Recommend he continues to take the steroids prescribed by his PCP. I discussed return precautions for any abdominal pain, GU pain or swelling, or signs or symptoms of cauda equina.  Pertinent labs & imaging results that were available during my care of the patient were reviewed  by me and considered in my medical decision making (see chart for details).    ____________________________________________   FINAL CLINICAL IMPRESSION(S) / ED DIAGNOSES  Final diagnoses:  Sciatica of right side      NEW MEDICATIONS STARTED DURING THIS VISIT:  New Prescriptions   IBUPROFEN (ADVIL,MOTRIN) 600 MG TABLET    Take 1 tablet (600 mg total) by mouth every 6 (six) hours as needed.   TRAMADOL (ULTRAM) 50 MG TABLET    Take 1 tablet (50 mg total) by mouth every 6 (six) hours as needed.     Note:  This document was prepared using Dragon voice recognition software and may include unintentional dictation errors.    Rudene Re, MD 07/28/17 2146

## 2017-07-29 ENCOUNTER — Telehealth: Payer: Self-pay

## 2017-07-29 NOTE — Telephone Encounter (Signed)
Spoke to pt's wife. She said he went to work today on light duty but not working all day. Was a little better today after the pain injection he received.

## 2017-11-18 ENCOUNTER — Telehealth: Payer: Self-pay | Admitting: Internal Medicine

## 2017-11-18 ENCOUNTER — Telehealth: Payer: Self-pay

## 2017-11-18 MED ORDER — HYDROCHLOROTHIAZIDE 12.5 MG PO TABS: 12.5000 mg | ORAL_TABLET | Freq: Every day | ORAL | 3 refills | Status: DC

## 2017-11-18 MED ORDER — LOSARTAN POTASSIUM 50 MG PO TABS: 50.0000 mg | ORAL_TABLET | Freq: Every day | ORAL | 3 refills | Status: DC

## 2017-11-18 NOTE — Telephone Encounter (Signed)
New rxs for separate meds sent to pharmacy

## 2017-11-18 NOTE — Telephone Encounter (Signed)
Copied from [place] [id number]. Topic: Quick Communication - See Telephone Encounter >> Nov 18, 2017  9:19 AM Bea Graff, NT wrote: CRM for notification. See Telephone encounter for: Pt states he went to the pharmacy to get his bp medicine refilled and they told him it was on back order for months and will be sending a message to the office to see if Dr. Silvio Pate will order something different for him to take. Pt calling to see if Dr. Silvio Pate can call something in for him to [facility] on [address]. Pt currently takes Losartan  11/18/17.

## 2017-11-18 NOTE — Telephone Encounter (Signed)
Go ahead and do the separate meds for now

## 2017-11-18 NOTE — Telephone Encounter (Signed)
Last annual 11/24/16 and pt scheduled with CPX 11/25/17.

## 2017-11-18 NOTE — Telephone Encounter (Signed)
See previous message

## 2017-11-18 NOTE — Telephone Encounter (Signed)
Fax from [facility] stating Losartan HCTZ 50-12.5 is on back order. Asked that we send a rx for the 2 separate meds. Was not sure if you wanted to make a change or just do the separate meds. Just let me know.

## 2017-11-25 ENCOUNTER — Encounter: Payer: 59 | Admitting: Internal Medicine

## 2017-11-25 ENCOUNTER — Telehealth: Payer: Self-pay

## 2017-11-25 NOTE — Telephone Encounter (Signed)
Copied from [place] #[id number]. Topic: Quick Communication - Appointment Cancellation >> Nov 25, 2017  9:31 AM Boyd Kerbs wrote: Patient called to cancel appointment scheduled for Today. Patient has not rescheduled their appointment. He said he would call when after 1st of year to reschedule. Car broke down in [place] this morning and can not make appt.   Route to department's PEC pool.

## 2017-11-25 NOTE — Telephone Encounter (Signed)
Do you want to charge late cancellation fee? Pt had appt today at 3 pm for CPX.

## 2017-11-25 NOTE — Telephone Encounter (Signed)
Done as requested.

## 2017-11-25 NOTE — Telephone Encounter (Signed)
Please do not charge him

## 2018-03-16 ENCOUNTER — Ambulatory Visit (INDEPENDENT_AMBULATORY_CARE_PROVIDER_SITE_OTHER): Payer: 59 | Admitting: Internal Medicine

## 2018-03-16 ENCOUNTER — Encounter: Payer: Self-pay | Admitting: Internal Medicine

## 2018-03-16 ENCOUNTER — Ambulatory Visit (INDEPENDENT_AMBULATORY_CARE_PROVIDER_SITE_OTHER)
Admission: RE | Admit: 2018-03-16 | Discharge: 2018-03-16 | Disposition: A | Discharge status: Home / Self Care | Payer: 59 | Source: Ambulatory Visit | Attending: Internal Medicine | Admitting: Internal Medicine

## 2018-03-16 VITALS — BP 108/72 | HR 90 | Temp 98.9°F | Ht 66.0 in | Wt 180.0 lb

## 2018-03-16 DIAGNOSIS — J42 Unspecified chronic bronchitis: Secondary | ICD-10-CM | POA: Diagnosis not present

## 2018-03-16 DIAGNOSIS — I10 Essential (primary) hypertension: Secondary | ICD-10-CM | POA: Diagnosis not present

## 2018-03-16 DIAGNOSIS — J209 Acute bronchitis, unspecified: Secondary | ICD-10-CM

## 2018-03-16 DIAGNOSIS — Z0000 Encounter for general adult medical examination without abnormal findings: Secondary | ICD-10-CM

## 2018-03-16 DIAGNOSIS — Z23 Encounter for immunization: Secondary | ICD-10-CM | POA: Diagnosis not present

## 2018-03-16 LAB — COMPREHENSIVE METABOLIC PANEL
ALT: 25 U/L (ref 0–53)
AST: 19 U/L (ref 0–37)
Albumin: 3.9 g/dL (ref 3.5–5.2)
Alkaline Phosphatase: 75 U/L (ref 39–117)
BILIRUBIN TOTAL: 0.8 mg/dL (ref 0.2–1.2)
BUN: 17 mg/dL (ref 6–23)
CO2: 32 meq/L (ref 19–32)
Calcium: 9.4 mg/dL (ref 8.4–10.5)
Chloride: 103 mEq/L (ref 96–112)
Creatinine, Ser: 1.02 mg/dL (ref 0.40–1.50)
GFR: 79.31 mL/min (ref >60.00)
GLUCOSE: 88 mg/dL (ref 70–99)
Potassium: 4 mEq/L (ref 3.5–5.1)
SODIUM: 141 meq/L (ref 135–145)
Total Protein: 7.2 g/dL (ref 6.0–8.3)

## 2018-03-16 LAB — CBC
HCT: 48 % (ref 39.0–52.0)
HEMOGLOBIN: 16.3 g/dL (ref 13.0–17.0)
MCHC: 33.9 g/dL (ref 30.0–36.0)
MCV: 99.5 fl (ref 78.0–100.0)
Platelets: 246 10*3/uL (ref 150.0–400.0)
RBC: 4.82 Mil/uL (ref 4.22–5.81)
RDW: 13.3 % (ref 11.5–15.5)
WBC: 12.4 10*3/uL — ABNORMAL HIGH (ref 4.0–10.5)

## 2018-03-16 MED ORDER — PREDNISONE 20 MG PO TABS: 40.0000 mg | ORAL_TABLET | Freq: Every day | ORAL | 0 refills | Status: DC

## 2018-03-16 MED ORDER — AZITHROMYCIN 250 MG PO TABS: ORAL_TABLET | ORAL | 0 refills | Status: DC

## 2018-03-16 NOTE — Assessment & Plan Note (Addendum)
Again discussed cigarette cessation Will check CXR Prefers no referral for lung cancer screening  CXR shows lingular infiltrate that I see from years ago--probably atelectasis Will treat with z-pak and 3 days of prednisone

## 2018-03-16 NOTE — Assessment & Plan Note (Signed)
BP Readings from Last 3 Encounters:  03/16/18 108/72  07/28/17 (!) 123/94  07/26/17 124/70   Good control No change needed

## 2018-03-16 NOTE — Assessment & Plan Note (Signed)
Discussed PSA---will hold off Needs Td Colon due 2024

## 2018-03-16 NOTE — Progress Notes (Signed)
Subjective:    Patient ID: Kurt [name], male    DOB: 13-Sep-1956, 62 y.o.   MRN: [id number]  HPI Here for physical  Having a persistent cough for 2 weeks Taking robitussin --this helps him sleep Significant increase from his usual No fever Did have a sweat going to work yesterday No change in breathing----feels something stuck in throat Thick yellow green sputum Still smoking--but has decreased.  Having toe cramping occasionally Gets "squeezing" sensation At rest and with movement  Current Outpatient Medications on File Prior to Visit  Medication Sig Dispense Refill  . aspirin 81 MG tablet Take 81 mg by mouth daily.    . hydrochlorothiazide (HYDRODIURIL) 12.5 MG tablet Take 1 tablet (12.5 mg total) by mouth daily. 90 tablet 3  . ibuprofen (ADVIL,MOTRIN) 600 MG tablet Take 1 tablet (600 mg total) by mouth every 6 (six) hours as needed. 20 tablet 0  . losartan (COZAAR) 50 MG tablet Take 1 tablet (50 mg total) by mouth daily. 90 tablet 3   No current facility-administered medications on file prior to visit.     No Known Allergies  Past Medical History:  Diagnosis Date  . COPD (chronic obstructive pulmonary disease) ([place])   . ED (erectile dysfunction)   . Hypertension   . Lumbar disc disease     Past Surgical History:  Procedure Laterality Date  . APPENDECTOMY      Family History  Problem Relation Age of Onset  . Hyperlipidemia Father   . Cancer Father   . Heart disease Father        heart valve disease (from Agent Orange?)  . Alcohol abuse Mother   . Hypertension Neg Hx   . Diabetes Neg Hx   . Colon cancer Neg Hx   . Colon polyps Neg Hx   . Pancreatic cancer Neg Hx   . Rectal cancer Neg Hx   . Stomach cancer Neg Hx     Social History   Socioeconomic History  . Marital status: Married    Spouse name: Not on file  . Number of children: 1  . Years of education: Not on file  . Highest education level: Not on file  Occupational History  . Occupation:  Dealer at [employer]  . Financial resource strain: Not on file  . Food insecurity:    Worry: Not on file    Inability: Not on file  . Transportation needs:    Medical: Not on file    Non-medical: Not on file  Tobacco Use  . Smoking status: Current Every Day Smoker  . Smokeless tobacco: Never Used  Substance and Sexual Activity  . Alcohol use: Yes  . Drug use: Yes    Types: Marijuana  . Sexual activity: Not on file  Lifestyle  . Physical activity:    Days per week: Not on file    Minutes per session: Not on file  . Stress: Not on file  Relationships  . Social connections:    Talks on phone: Not on file    Gets together: Not on file    Attends religious service: Not on file    Active member of club or organization: Not on file    Attends meetings of clubs or organizations: Not on file    Relationship status: Not on file  . Intimate partner violence:    Fear of current or ex partner: Not on file    Emotionally abused: Not on file  Physically abused: Not on file    Forced sexual activity: Not on file  Other Topics Concern  . Not on file  Social History Narrative  . Not on file   Review of Systems  Constitutional: Negative for fatigue and unexpected weight change.       Wears seat belt  HENT: Positive for dental problem. Negative for hearing loss, tinnitus and trouble swallowing.        Teeth bad--pulls them himself when bad--discussed  Eyes:       No diplopia  Right eye vision is blurry  Respiratory: Positive for cough and wheezing. Negative for shortness of breath.   Cardiovascular: Negative for chest pain, palpitations and leg swelling.  Gastrointestinal: Negative for abdominal pain and blood in stool.       Some constipation---softener helps No heartburn  Endocrine: Negative for polydipsia and polyuria.  Genitourinary: Negative for difficulty urinating and urgency.       No sex-- no problem  Musculoskeletal:  Positive for back pain. Negative for arthralgias and joint swelling.  Skin: Negative for rash.       No suspicious lesions  Allergic/Immunologic: Negative for environmental allergies and immunocompromised state.  Neurological: Negative for dizziness, syncope, light-headedness and headaches.  Hematological: Negative for adenopathy. Bruises/bleeds easily.  Psychiatric/Behavioral: Negative for dysphoric mood and sleep disturbance. The patient is not nervous/anxious.        Objective:   Physical Exam  Constitutional: He is oriented to person, place, and time. He appears well-developed. No distress.  HENT:  Head: Normocephalic and atraumatic.  Right Ear: External ear normal.  Left Ear: External ear normal.  Mouth/Throat: Oropharynx is clear and moist. No oropharyngeal exudate.  Eyes: Pupils are equal, round, and reactive to light. Conjunctivae are normal.  Neck: No thyromegaly present.  Cardiovascular: Normal rate, regular rhythm, normal heart sounds and intact distal pulses. Exam reveals no gallop.  No murmur heard. Pulmonary/Chest: Effort normal. No respiratory distress. He has no rales.  Decreased breath sounds Slightly prolonged expiration and mild exp wheezes  Abdominal: Soft. He exhibits no distension. There is no tenderness. There is no rebound and no guarding.  Musculoskeletal: He exhibits no edema or tenderness.  Lymphadenopathy:    He has no cervical adenopathy.  Neurological: He is alert and oriented to person, place, and time.  Skin: No rash noted. No erythema.  Psychiatric: He has a normal mood and affect. His behavior is normal.          Assessment & Plan:

## 2018-03-16 NOTE — Addendum Note (Signed)
Addended by: Pilar Grammes on: 03/16/2018 10:15 AM   Modules accepted: Orders

## 2018-10-29 ENCOUNTER — Other Ambulatory Visit: Payer: Self-pay | Admitting: Internal Medicine

## 2018-11-18 ENCOUNTER — Ambulatory Visit (INDEPENDENT_AMBULATORY_CARE_PROVIDER_SITE_OTHER)
Admission: RE | Admit: 2018-11-18 | Discharge: 2018-11-18 | Disposition: A | Discharge status: Home / Self Care | Payer: 59 | Source: Ambulatory Visit | Attending: Family Medicine | Admitting: Family Medicine

## 2018-11-18 ENCOUNTER — Ambulatory Visit (INDEPENDENT_AMBULATORY_CARE_PROVIDER_SITE_OTHER): Payer: 59 | Admitting: Family Medicine

## 2018-11-18 ENCOUNTER — Encounter: Payer: Self-pay | Admitting: Family Medicine

## 2018-11-18 VITALS — BP 118/76 | HR 70 | Temp 97.9°F | Ht 66.0 in | Wt 178.0 lb

## 2018-11-18 DIAGNOSIS — M5416 Radiculopathy, lumbar region: Secondary | ICD-10-CM

## 2018-11-18 DIAGNOSIS — Z23 Encounter for immunization: Secondary | ICD-10-CM

## 2018-11-18 MED ORDER — PREDNISONE 20 MG PO TABS: ORAL_TABLET | ORAL | 0 refills | Status: DC

## 2018-11-18 MED ORDER — TRAMADOL HCL 50 MG PO TABS: 50.0000 mg | ORAL_TABLET | Freq: Three times a day (TID) | ORAL | 0 refills | Status: DC | PRN

## 2018-11-18 NOTE — Assessment & Plan Note (Signed)
Send for X-ray given trauma and vertebral ttp.  Start pred taper tramadol for breakthorugh pain, heat, home stretches given.

## 2018-11-18 NOTE — Progress Notes (Signed)
   Subjective:    Patient ID: Kurt [name], male    DOB: [birth year], 62 y.o.   MRN: [id number]  HPI   62 year old male pt of Dr. Lorelei Pont with  history of chronic low back pain ( disc disease) present s with new acute injury.  He was jumping off a piece of equipment, 3 feet, repeated several times. Landed wrong.. Pain came the next day.    Pain in left lower back, pain radiates down left leg. Shooting pain.  Cannot get comfortable sitting or lying.  No numbness and weakness in left leg.   No loss control urine or numbness in groin or fever.   800 mg of ibuprofen.. helps a little.  Hx of ruptured sic in past.. treated ESI.   No past back surgeries.  Social History /Family History/Past Medical History reviewed in detail and updated in EMR if needed. Blood pressure 118/76, pulse 70, temperature 97.9 F (36.6 C), temperature source Oral, height 5\' 6"  (1.676 m), weight 178 lb (80.7 kg), SpO2 93 %.  Review of Systems  Constitutional: Negative for fatigue and fever.  HENT: Negative for ear pain.   Eyes: Negative for pain.  Respiratory: Negative for cough and shortness of breath.   Cardiovascular: Negative for chest pain, palpitations and leg swelling.  Gastrointestinal: Negative for abdominal pain.  Genitourinary: Negative for dysuria.  Musculoskeletal: Positive for back pain and gait problem. Negative for arthralgias.  Neurological: Negative for syncope, light-headedness and headaches.  Psychiatric/Behavioral: Negative for dysphoric mood.       Objective:   Physical Exam Constitutional:      Appearance: He is well-developed.  HENT:     Head: Normocephalic.     Right Ear: Hearing normal.     Left Ear: Hearing normal.     Nose: Nose normal.  Neck:     Thyroid: No thyroid mass or thyromegaly.     Vascular: No carotid bruit.     Trachea: Trachea normal.  Cardiovascular:     Rate and Rhythm: Normal rate and regular rhythm.     Pulses: Normal pulses.     Heart sounds: Heart  sounds not distant. No murmur. No friction rub. No gallop.      Comments: No peripheral edema Pulmonary:     Effort: Pulmonary effort is normal. No respiratory distress.     Breath sounds: Normal breath sounds.  Musculoskeletal:     Lumbar back: He exhibits decreased range of motion, tenderness and bony tenderness.     Comments: ttp at L4L5 ttp in left sciatic notch and left low back. Posisitve SLR on left  Skin:    General: Skin is warm and dry.     Findings: No rash.  Neurological:     Mental Status: He is oriented to person, place, and time.     Cranial Nerves: Cranial nerves are intact.     Sensory: Sensation is intact.     Motor: Motor function is intact. No weakness.     Gait: Gait abnormal.  Psychiatric:        Speech: Speech normal.        Behavior: Behavior normal.        Thought Content: Thought content normal.           Assessment & Plan:

## 2018-11-18 NOTE — Patient Instructions (Addendum)
We will call with X-ray results.  Start heat, and start gentle stretching.  Stop ibuprofen.  Can use tramadol for breakthrough pain.  Start prednisone taper. When done can restart the ibuprofen.  Call if not improving in 2 weeks for further recommendations

## 2018-11-19 ENCOUNTER — Other Ambulatory Visit: Payer: Self-pay | Admitting: Internal Medicine

## 2019-03-22 ENCOUNTER — Encounter: Payer: 59 | Admitting: Internal Medicine

## 2019-04-29 ENCOUNTER — Other Ambulatory Visit: Payer: Self-pay | Admitting: Internal Medicine

## 2019-08-01 ENCOUNTER — Other Ambulatory Visit: Payer: Self-pay | Admitting: Internal Medicine

## 2019-10-11 ENCOUNTER — Telehealth: Payer: Self-pay

## 2019-10-11 MED ORDER — TRAMADOL HCL 50 MG PO TABS: 50.0000 mg | ORAL_TABLET | Freq: Three times a day (TID) | ORAL | 0 refills | Status: DC | PRN

## 2019-10-11 NOTE — Telephone Encounter (Signed)
Last filled 11-18-18 #30 Last OV 11-18-18 for Back Pain with Dr Diona Browner No Future Lueders

## 2019-10-11 NOTE — Telephone Encounter (Signed)
Patient scheduled cpx appointment on 01/15/20.

## 2019-10-11 NOTE — Telephone Encounter (Signed)
Rx done Please see if he can come in for a PE in the next 3-6 months

## 2019-11-02 ENCOUNTER — Other Ambulatory Visit: Payer: Self-pay | Admitting: Internal Medicine

## 2019-11-19 ENCOUNTER — Other Ambulatory Visit: Payer: Self-pay | Admitting: Internal Medicine

## 2019-11-23 ENCOUNTER — Inpatient Hospital Stay: Payer: 59

## 2019-11-23 ENCOUNTER — Inpatient Hospital Stay
Admission: EM | Admit: 2019-11-23 | Discharge: 2019-12-11 | DRG: 246 | Disposition: A | Discharge status: Home Health | Payer: 59 | Attending: Internal Medicine | Admitting: Internal Medicine

## 2019-11-23 ENCOUNTER — Encounter
Admission: EM | Disposition: A | Discharge status: Home Health | Payer: Self-pay | Source: Home / Self Care | Attending: Internal Medicine

## 2019-11-23 ENCOUNTER — Emergency Department: Payer: 59

## 2019-11-23 ENCOUNTER — Other Ambulatory Visit: Payer: Self-pay

## 2019-11-23 DIAGNOSIS — S0101XA Laceration without foreign body of scalp, initial encounter: Secondary | ICD-10-CM | POA: Diagnosis not present

## 2019-11-23 DIAGNOSIS — Z9911 Dependence on respirator [ventilator] status: Secondary | ICD-10-CM | POA: Diagnosis not present

## 2019-11-23 DIAGNOSIS — J9602 Acute respiratory failure with hypercapnia: Secondary | ICD-10-CM | POA: Diagnosis present

## 2019-11-23 DIAGNOSIS — S0990XA Unspecified injury of head, initial encounter: Secondary | ICD-10-CM | POA: Diagnosis present

## 2019-11-23 DIAGNOSIS — Z452 Encounter for adjustment and management of vascular access device: Secondary | ICD-10-CM

## 2019-11-23 DIAGNOSIS — E876 Hypokalemia: Secondary | ICD-10-CM | POA: Diagnosis not present

## 2019-11-23 DIAGNOSIS — I2111 ST elevation (STEMI) myocardial infarction involving right coronary artery: Secondary | ICD-10-CM | POA: Diagnosis present

## 2019-11-23 DIAGNOSIS — J449 Chronic obstructive pulmonary disease, unspecified: Secondary | ICD-10-CM | POA: Diagnosis present

## 2019-11-23 DIAGNOSIS — J690 Pneumonitis due to inhalation of food and vomit: Secondary | ICD-10-CM | POA: Diagnosis present

## 2019-11-23 DIAGNOSIS — Z7289 Other problems related to lifestyle: Secondary | ICD-10-CM

## 2019-11-23 DIAGNOSIS — T405X1A Poisoning by cocaine, accidental (unintentional), initial encounter: Secondary | ICD-10-CM | POA: Diagnosis present

## 2019-11-23 DIAGNOSIS — N529 Male erectile dysfunction, unspecified: Secondary | ICD-10-CM | POA: Diagnosis present

## 2019-11-23 DIAGNOSIS — Z20822 Contact with and (suspected) exposure to covid-19: Secondary | ICD-10-CM | POA: Diagnosis present

## 2019-11-23 DIAGNOSIS — N179 Acute kidney failure, unspecified: Secondary | ICD-10-CM | POA: Diagnosis present

## 2019-11-23 DIAGNOSIS — E872 Acidosis: Secondary | ICD-10-CM | POA: Diagnosis present

## 2019-11-23 DIAGNOSIS — I2510 Atherosclerotic heart disease of native coronary artery without angina pectoris: Secondary | ICD-10-CM | POA: Diagnosis present

## 2019-11-23 DIAGNOSIS — S01111A Laceration without foreign body of right eyelid and periocular area, initial encounter: Secondary | ICD-10-CM | POA: Diagnosis present

## 2019-11-23 DIAGNOSIS — Z4659 Encounter for fitting and adjustment of other gastrointestinal appliance and device: Secondary | ICD-10-CM

## 2019-11-23 DIAGNOSIS — Z79899 Other long term (current) drug therapy: Secondary | ICD-10-CM

## 2019-11-23 DIAGNOSIS — E875 Hyperkalemia: Secondary | ICD-10-CM | POA: Diagnosis present

## 2019-11-23 DIAGNOSIS — Z8674 Personal history of sudden cardiac arrest: Secondary | ICD-10-CM

## 2019-11-23 DIAGNOSIS — F1290 Cannabis use, unspecified, uncomplicated: Secondary | ICD-10-CM | POA: Diagnosis present

## 2019-11-23 DIAGNOSIS — F17210 Nicotine dependence, cigarettes, uncomplicated: Secondary | ICD-10-CM | POA: Diagnosis present

## 2019-11-23 DIAGNOSIS — F1490 Cocaine use, unspecified, uncomplicated: Secondary | ICD-10-CM | POA: Diagnosis present

## 2019-11-23 DIAGNOSIS — W1839XA Other fall on same level, initial encounter: Secondary | ICD-10-CM | POA: Diagnosis present

## 2019-11-23 DIAGNOSIS — K219 Gastro-esophageal reflux disease without esophagitis: Secondary | ICD-10-CM | POA: Diagnosis present

## 2019-11-23 DIAGNOSIS — I469 Cardiac arrest, cause unspecified: Secondary | ICD-10-CM | POA: Diagnosis present

## 2019-11-23 DIAGNOSIS — J9601 Acute respiratory failure with hypoxia: Secondary | ICD-10-CM | POA: Diagnosis present

## 2019-11-23 DIAGNOSIS — F05 Delirium due to known physiological condition: Secondary | ICD-10-CM | POA: Diagnosis not present

## 2019-11-23 DIAGNOSIS — E162 Hypoglycemia, unspecified: Secondary | ICD-10-CM | POA: Diagnosis present

## 2019-11-23 DIAGNOSIS — Z7151 Drug abuse counseling and surveillance of drug abuser: Secondary | ICD-10-CM

## 2019-11-23 DIAGNOSIS — I10 Essential (primary) hypertension: Secondary | ICD-10-CM | POA: Diagnosis present

## 2019-11-23 DIAGNOSIS — B359 Dermatophytosis, unspecified: Secondary | ICD-10-CM | POA: Diagnosis present

## 2019-11-23 DIAGNOSIS — Z8349 Family history of other endocrine, nutritional and metabolic diseases: Secondary | ICD-10-CM

## 2019-11-23 DIAGNOSIS — R570 Cardiogenic shock: Secondary | ICD-10-CM | POA: Diagnosis present

## 2019-11-23 DIAGNOSIS — J9600 Acute respiratory failure, unspecified whether with hypoxia or hypercapnia: Secondary | ICD-10-CM

## 2019-11-23 DIAGNOSIS — Z79891 Long term (current) use of opiate analgesic: Secondary | ICD-10-CM

## 2019-11-23 DIAGNOSIS — J984 Other disorders of lung: Secondary | ICD-10-CM

## 2019-11-23 DIAGNOSIS — Z811 Family history of alcohol abuse and dependence: Secondary | ICD-10-CM

## 2019-11-23 DIAGNOSIS — R451 Restlessness and agitation: Secondary | ICD-10-CM | POA: Diagnosis not present

## 2019-11-23 DIAGNOSIS — Z7982 Long term (current) use of aspirin: Secondary | ICD-10-CM

## 2019-11-23 DIAGNOSIS — Z8249 Family history of ischemic heart disease and other diseases of the circulatory system: Secondary | ICD-10-CM

## 2019-11-23 HISTORY — PX: LEFT HEART CATH AND CORONARY ANGIOGRAPHY: CATH118249

## 2019-11-23 HISTORY — PX: CORONARY STENT INTERVENTION: CATH118234

## 2019-11-23 HISTORY — PX: CORONARY/GRAFT ACUTE MI REVASCULARIZATION: CATH118305

## 2019-11-23 LAB — CBC WITH DIFFERENTIAL/PLATELET
Abs Immature Granulocytes: 0 10*3/uL (ref 0.00–0.07)
Band Neutrophils: 0 %
Basophils Absolute: 0.2 10*3/uL — ABNORMAL HIGH (ref 0.0–0.1)
Basophils Relative: 1 %
Blasts: 0 %
Eosinophils Absolute: 0.4 10*3/uL (ref 0.0–0.5)
Eosinophils Relative: 2 %
HCT: 50.1 % (ref 39.0–52.0)
Hemoglobin: 16.8 g/dL (ref 13.0–17.0)
Lymphocytes Relative: 22 %
Lymphs Abs: 4.5 10*3/uL — ABNORMAL HIGH (ref 0.7–4.0)
MCH: 33.5 pg (ref 26.0–34.0)
MCHC: 33.5 g/dL (ref 30.0–36.0)
MCV: 100 fL (ref 80.0–100.0)
Metamyelocytes Relative: 0 %
Monocytes Absolute: 1.2 10*3/uL — ABNORMAL HIGH (ref 0.1–1.0)
Monocytes Relative: 6 %
Myelocytes: 0 %
Neutro Abs: 13.4 10*3/uL — ABNORMAL HIGH (ref 1.7–7.7)
Neutrophils Relative %: 65 %
Other: 4 %
Platelets: 195 10*3/uL (ref 150–400)
Promyelocytes Relative: 0 %
RBC: 5.01 MIL/uL (ref 4.22–5.81)
RDW: 13 % (ref 11.5–15.5)
WBC: 20.6 10*3/uL — ABNORMAL HIGH (ref 4.0–10.5)
nRBC: 0 /100 WBC
nRBC: 0.1 % (ref 0.0–0.2)

## 2019-11-23 LAB — PROTIME-INR
INR: 1.3 — ABNORMAL HIGH (ref 0.8–1.2)
INR: 1.5 — ABNORMAL HIGH (ref 0.8–1.2)
Prothrombin Time: 15.8 seconds — ABNORMAL HIGH (ref 11.4–15.2)
Prothrombin Time: 18 seconds — ABNORMAL HIGH (ref 11.4–15.2)

## 2019-11-23 LAB — BLOOD GAS, ARTERIAL
Acid-base deficit: 9.7 mmol/L — ABNORMAL HIGH (ref 0.0–2.0)
Acid-base deficit: 9.8 mmol/L — ABNORMAL HIGH (ref 0.0–2.0)
Bicarbonate: 21.2 mmol/L (ref 20.0–28.0)
Bicarbonate: 20.8 mmol/L (ref 20.0–28.0)
FIO2: 1
FIO2: 1
MECHVT: 550 mL
MECHVT: 550 mL
Mechanical Rate: 18
Mechanical Rate: 18
O2 Saturation: 74 %
O2 Saturation: 79.2 %
PEEP: 10 cmH2O
PEEP: 12 cmH2O
Patient temperature: 37
Patient temperature: 37
RATE: 18 resp/min
pCO2 arterial: 70 mmHg (ref 32.0–48.0)
pCO2 arterial: 67 mmHg (ref 32.0–48.0)
pH, Arterial: 7.09 — CL (ref 7.350–7.450)
pH, Arterial: 7.1 — CL (ref 7.350–7.450)
pO2, Arterial: 55 mmHg — ABNORMAL LOW (ref 83.0–108.0)
pO2, Arterial: 60 mmHg — ABNORMAL LOW (ref 83.0–108.0)

## 2019-11-23 LAB — URINE DRUG SCREEN, QUALITATIVE (ARMC ONLY)
Amphetamines, Ur Screen: NOT DETECTED
Barbiturates, Ur Screen: NOT DETECTED
Benzodiazepine, Ur Scrn: NOT DETECTED
Cannabinoid 50 Ng, Ur ~~LOC~~: NOT DETECTED
Cocaine Metabolite,Ur ~~LOC~~: NOT DETECTED
MDMA (Ecstasy)Ur Screen: NOT DETECTED
Methadone Scn, Ur: NOT DETECTED
Opiate, Ur Screen: NOT DETECTED
Phencyclidine (PCP) Ur S: NOT DETECTED
Tricyclic, Ur Screen: NOT DETECTED

## 2019-11-23 LAB — URINALYSIS, ROUTINE W REFLEX MICROSCOPIC
Bilirubin Urine: NEGATIVE
Glucose, UA: >=500 mg/dL — AB
Ketones, ur: NEGATIVE mg/dL
Leukocytes,Ua: NEGATIVE
Nitrite: NEGATIVE
Protein, ur: 100 mg/dL — AB
RBC / HPF: >50 RBC/hpf — ABNORMAL HIGH (ref 0–5)
Specific Gravity, Urine: 1.01 (ref 1.005–1.030)
pH: 7 (ref 5.0–8.0)

## 2019-11-23 LAB — COMPREHENSIVE METABOLIC PANEL
ALT: 42 U/L (ref 0–44)
AST: 65 U/L — ABNORMAL HIGH (ref 15–41)
Albumin: 3.3 g/dL — ABNORMAL LOW (ref 3.5–5.0)
Alkaline Phosphatase: 95 U/L (ref 38–126)
Anion gap: 13 (ref 5–15)
BUN: 23 mg/dL (ref 8–23)
CO2: 23 mmol/L (ref 22–32)
Calcium: 8.1 mg/dL — ABNORMAL LOW (ref 8.9–10.3)
Chloride: 105 mmol/L (ref 98–111)
Creatinine, Ser: 1.39 mg/dL — ABNORMAL HIGH (ref 0.61–1.24)
GFR calc Af Amer: >60 mL/min (ref >60)
GFR calc non Af Amer: 54 mL/min — ABNORMAL LOW (ref >60)
Glucose, Bld: 332 mg/dL — ABNORMAL HIGH (ref 70–99)
Potassium: 2.8 mmol/L — ABNORMAL LOW (ref 3.5–5.1)
Sodium: 141 mmol/L (ref 135–145)
Total Bilirubin: 0.7 mg/dL (ref 0.3–1.2)
Total Protein: 6.6 g/dL (ref 6.5–8.1)

## 2019-11-23 LAB — LIPID PANEL
Cholesterol: 157 mg/dL (ref 0–200)
HDL: 43 mg/dL (ref >40)
LDL Cholesterol: 92 mg/dL (ref 0–99)
Total CHOL/HDL Ratio: 3.7 RATIO
Triglycerides: 110 mg/dL (ref <150)
VLDL: 22 mg/dL (ref 0–40)

## 2019-11-23 LAB — APTT
aPTT: 42 seconds — ABNORMAL HIGH (ref 24–36)
aPTT: 48 seconds — ABNORMAL HIGH (ref 24–36)

## 2019-11-23 LAB — RESPIRATORY PANEL BY RT PCR (FLU A&B, COVID)
Influenza A by PCR: NEGATIVE
Influenza B by PCR: NEGATIVE
SARS Coronavirus 2 by RT PCR: NEGATIVE

## 2019-11-23 LAB — CBC
HCT: 48.3 % (ref 39.0–52.0)
Hemoglobin: 15.1 g/dL (ref 13.0–17.0)
MCH: 32.8 pg (ref 26.0–34.0)
MCHC: 31.3 g/dL (ref 30.0–36.0)
MCV: 105 fL — ABNORMAL HIGH (ref 80.0–100.0)
Platelets: 200 10*3/uL (ref 150–400)
RBC: 4.6 MIL/uL (ref 4.22–5.81)
RDW: 13.2 % (ref 11.5–15.5)
WBC: 28.8 10*3/uL — ABNORMAL HIGH (ref 4.0–10.5)
nRBC: 0 % (ref 0.0–0.2)

## 2019-11-23 LAB — MAGNESIUM: Magnesium: 2.3 mg/dL (ref 1.7–2.4)

## 2019-11-23 LAB — MRSA PCR SCREENING: MRSA by PCR: NEGATIVE

## 2019-11-23 LAB — POCT ACTIVATED CLOTTING TIME: Activated Clotting Time: 560 seconds

## 2019-11-23 LAB — BRAIN NATRIURETIC PEPTIDE: B Natriuretic Peptide: 86 pg/mL (ref 0.0–100.0)

## 2019-11-23 LAB — TROPONIN I (HIGH SENSITIVITY): Troponin I (High Sensitivity): 205 ng/L (ref <18)

## 2019-11-23 LAB — LACTIC ACID, PLASMA: Lactic Acid, Venous: 1.9 mmol/L (ref 0.5–1.9)

## 2019-11-23 LAB — TRIGLYCERIDES: Triglycerides: 233 mg/dL — ABNORMAL HIGH (ref <150)

## 2019-11-23 LAB — GLUCOSE, CAPILLARY: Glucose-Capillary: 170 mg/dL — ABNORMAL HIGH (ref 70–99)

## 2019-11-23 SURGERY — CORONARY/GRAFT ACUTE MI REVASCULARIZATION
Anesthesia: Moderate Sedation

## 2019-11-23 SURGERY — LEFT HEART CATH AND CORONARY ANGIOGRAPHY
Anesthesia: Moderate Sedation

## 2019-11-23 MED ORDER — METOPROLOL TARTRATE 5 MG/5ML IV SOLN
INTRAVENOUS | Status: AC
  Filled 2019-11-23: qty 5

## 2019-11-23 MED ORDER — POTASSIUM CHLORIDE 10 MEQ/100ML IV SOLN
10.0000 meq | INTRAVENOUS | Status: AC
  Filled 2019-11-23 (×2): qty 100

## 2019-11-23 MED ORDER — MIDAZOLAM HCL 2 MG/2ML IJ SOLN
2.0000 mg | INTRAMUSCULAR | Status: AC | PRN
  Administered 2019-11-24 – 2019-11-25 (×3): 2 mg via INTRAVENOUS
  Filled 2019-11-23 (×3): qty 2

## 2019-11-23 MED ORDER — FAMOTIDINE IN NACL 20-0.9 MG/50ML-% IV SOLN
20.0000 mg | Freq: Two times a day (BID) | INTRAVENOUS | Status: DC
  Administered 2019-11-23 – 2019-12-04 (×22): 20 mg via INTRAVENOUS
  Filled 2019-11-23 (×22): qty 50

## 2019-11-23 MED ORDER — AMIODARONE HCL IN DEXTROSE 360-4.14 MG/200ML-% IV SOLN
30.0000 mg/h | INTRAVENOUS | Status: DC
  Administered 2019-11-24: 30 mg/h via INTRAVENOUS
  Filled 2019-11-23: qty 200

## 2019-11-23 MED ORDER — NOREPINEPHRINE 16 MG/250ML-% IV SOLN
0.0000 ug/min | INTRAVENOUS | Status: DC
  Administered 2019-11-23: 40 ug/min via INTRAVENOUS
  Administered 2019-11-24: 10 ug/min via INTRAVENOUS
  Administered 2019-11-24: 15 ug/min via INTRAVENOUS
  Administered 2019-11-24: 05:00:00 35 ug/min via INTRAVENOUS
  Administered 2019-11-25: 14 ug/min via INTRAVENOUS
  Administered 2019-11-25: 16:00:00 8 ug/min via INTRAVENOUS
  Filled 2019-11-23 (×5): qty 250

## 2019-11-23 MED ORDER — HYDROCORTISONE NA SUCCINATE PF 100 MG IJ SOLR
50.0000 mg | Freq: Once | INTRAMUSCULAR | Status: AC
  Administered 2019-11-23: 23:00:00 50 mg via INTRAVENOUS

## 2019-11-23 MED ORDER — MAGNESIUM SULFATE 50 % IJ SOLN
INTRAMUSCULAR | Status: AC
  Filled 2019-11-23: qty 2

## 2019-11-23 MED ORDER — SODIUM CHLORIDE 0.9% FLUSH: 3.0000 mL | INTRAVENOUS | Status: DC | PRN

## 2019-11-23 MED ORDER — FENTANYL 2500MCG IN NS 250ML (10MCG/ML) PREMIX INFUSION
0.0000 ug/h | INTRAVENOUS | Status: DC
  Administered 2019-11-24: 15:00:00 125 ug/h via INTRAVENOUS
  Administered 2019-11-25 – 2019-11-27 (×5): 200 ug/h via INTRAVENOUS
  Administered 2019-11-28: 100 ug/h via INTRAVENOUS
  Administered 2019-11-28: 05:00:00 300 ug/h via INTRAVENOUS
  Administered 2019-11-29: 15:00:00 100 ug/h via INTRAVENOUS
  Filled 2019-11-23 (×11): qty 250

## 2019-11-23 MED ORDER — PROPOFOL 1000 MG/100ML IV EMUL
INTRAVENOUS | Status: AC
  Administered 2019-11-23: 23:00:00 50 ug/kg/min via INTRAVENOUS
  Filled 2019-11-23: qty 100

## 2019-11-23 MED ORDER — CHLORHEXIDINE GLUCONATE 0.12% ORAL RINSE (MEDLINE KIT)
15.0000 mL | Freq: Two times a day (BID) | OROMUCOSAL | Status: DC
  Administered 2019-11-23: 15 mL via OROMUCOSAL

## 2019-11-23 MED ORDER — SODIUM CHLORIDE 0.9 % IV SOLN
0.0000 ug/min | INTRAVENOUS | Status: DC
  Filled 2019-11-23: qty 4

## 2019-11-23 MED ORDER — MAGNESIUM SULFATE 50 % IJ SOLN
INTRAMUSCULAR | Status: DC | PRN
  Administered 2019-11-23: 2 g via INTRAVENOUS

## 2019-11-23 MED ORDER — PROPOFOL 1000 MG/100ML IV EMUL
INTRAVENOUS | Status: AC
  Administered 2019-11-23: 17:00:00 10 ug/kg/min via INTRAVENOUS
  Filled 2019-11-23: qty 100

## 2019-11-23 MED ORDER — ACETAMINOPHEN 325 MG PO TABS
650.0000 mg | ORAL_TABLET | ORAL | Status: DC | PRN
  Administered 2019-12-05: 650 mg via ORAL
  Filled 2019-11-23: qty 2

## 2019-11-23 MED ORDER — HEPARIN SODIUM (PORCINE) 5000 UNIT/ML IJ SOLN
5000.0000 [IU] | Freq: Three times a day (TID) | INTRAMUSCULAR | Status: DC
  Administered 2019-11-23 – 2019-11-26 (×8): 5000 [IU] via SUBCUTANEOUS
  Filled 2019-11-23 (×8): qty 1

## 2019-11-23 MED ORDER — ASPIRIN 81 MG PO CHEW
CHEWABLE_TABLET | ORAL | Status: DC | PRN
  Administered 2019-11-23: 324 mg

## 2019-11-23 MED ORDER — LISINOPRIL 10 MG PO TABS
5.0000 mg | ORAL_TABLET | Freq: Every day | ORAL | Status: DC
  Filled 2019-11-23: qty 0.5

## 2019-11-23 MED ORDER — PHENYLEPHRINE CONCENTRATED 100MG/250ML (0.4 MG/ML) INFUSION SIMPLE
0.0000 ug/min | INTRAVENOUS | Status: DC
  Administered 2019-11-23: 100 ug/min via INTRAVENOUS
  Filled 2019-11-23: qty 250

## 2019-11-23 MED ORDER — BIVALIRUDIN BOLUS VIA INFUSION - CUPID
INTRAVENOUS | Status: DC | PRN
  Administered 2019-11-23: 64.65 mg via INTRAVENOUS

## 2019-11-23 MED ORDER — SODIUM CHLORIDE 0.9 % IV SOLN: 250.0000 mL | INTRAVENOUS | Status: DC

## 2019-11-23 MED ORDER — SODIUM CHLORIDE 0.9 % IV SOLN
0.2500 mg/kg/h | INTRAVENOUS | Status: AC
  Filled 2019-11-23: qty 250

## 2019-11-23 MED ORDER — AMIODARONE HCL IN DEXTROSE 360-4.14 MG/200ML-% IV SOLN
60.0000 mg/h | INTRAVENOUS | Status: AC
  Administered 2019-11-23: 23:00:00 60 mg/h via INTRAVENOUS

## 2019-11-23 MED ORDER — MIDAZOLAM HCL 2 MG/2ML IJ SOLN
INTRAMUSCULAR | Status: AC
  Administered 2019-11-23: 21:00:00 2 mg via INTRAVENOUS
  Filled 2019-11-23: qty 2

## 2019-11-23 MED ORDER — SODIUM CHLORIDE 0.9 % IV SOLN
INTRAVENOUS | Status: AC | PRN
  Administered 2019-11-23: 4 ug/kg/min via INTRAVENOUS

## 2019-11-23 MED ORDER — INSULIN ASPART 100 UNIT/ML ~~LOC~~ SOLN
0.0000 [IU] | SUBCUTANEOUS | Status: DC
  Administered 2019-11-24: 01:00:00 2 [IU] via SUBCUTANEOUS
  Administered 2019-11-25: 1 [IU] via SUBCUTANEOUS
  Administered 2019-11-25 – 2019-11-26 (×3): 2 [IU] via SUBCUTANEOUS
  Filled 2019-11-23 (×5): qty 1

## 2019-11-23 MED ORDER — TICAGRELOR 90 MG PO TABS
ORAL_TABLET | ORAL | Status: DC | PRN
  Administered 2019-11-23: 180 mg

## 2019-11-23 MED ORDER — SODIUM CHLORIDE 0.9 % IV SOLN
4.0000 ug/kg/min | INTRAVENOUS | Status: DC
  Filled 2019-11-23: qty 50

## 2019-11-23 MED ORDER — SODIUM CHLORIDE 0.9% FLUSH
3.0000 mL | Freq: Two times a day (BID) | INTRAVENOUS | Status: DC
  Administered 2019-11-23 – 2019-11-25 (×3): 3 mL via INTRAVENOUS

## 2019-11-23 MED ORDER — METOPROLOL TARTRATE 50 MG PO TABS: 25.0000 mg | ORAL_TABLET | Freq: Two times a day (BID) | ORAL | Status: DC

## 2019-11-23 MED ORDER — MIDAZOLAM HCL 2 MG/2ML IJ SOLN
INTRAMUSCULAR | Status: AC
  Filled 2019-11-23: qty 2

## 2019-11-23 MED ORDER — ROSUVASTATIN CALCIUM 20 MG PO TABS
40.0000 mg | ORAL_TABLET | Freq: Every day | ORAL | Status: DC
  Administered 2019-11-25 – 2019-11-28 (×4): 40 mg via ORAL
  Filled 2019-11-23: qty 4
  Filled 2019-11-23: qty 2
  Filled 2019-11-23: qty 4
  Filled 2019-11-23 (×3): qty 2
  Filled 2019-11-23 (×2): qty 4
  Filled 2019-11-23: qty 2

## 2019-11-23 MED ORDER — SODIUM CHLORIDE 0.9 % IV SOLN
INTRAVENOUS | Status: AC | PRN
  Administered 2019-11-23: 1.75 mg/kg/h via INTRAVENOUS

## 2019-11-23 MED ORDER — CANGRELOR BOLUS VIA INFUSION
INTRAVENOUS | Status: DC | PRN
  Administered 2019-11-23: 2586 ug via INTRAVENOUS

## 2019-11-23 MED ORDER — TICAGRELOR 90 MG PO TABS
ORAL_TABLET | ORAL | Status: AC
  Filled 2019-11-23: qty 2

## 2019-11-23 MED ORDER — SODIUM CHLORIDE 0.9 % IV SOLN
25.0000 ug/min | INTRAVENOUS | Status: DC
  Filled 2019-11-23: qty 1

## 2019-11-23 MED ORDER — FENTANYL CITRATE (PF) 100 MCG/2ML IJ SOLN
INTRAMUSCULAR | Status: AC
  Filled 2019-11-23: qty 2

## 2019-11-23 MED ORDER — VASOPRESSIN 20 UNIT/ML IV SOLN
0.0300 [IU]/min | INTRAVENOUS | Status: DC
  Administered 2019-11-23: 23:00:00 0.03 [IU]/min via INTRAVENOUS
  Filled 2019-11-23: qty 2

## 2019-11-23 MED ORDER — VECURONIUM BROMIDE 10 MG IV SOLR
10.0000 mg | INTRAVENOUS | Status: DC | PRN
  Administered 2019-11-24: 10 mg via INTRAVENOUS
  Filled 2019-11-23: qty 10

## 2019-11-23 MED ORDER — BIVALIRUDIN TRIFLUOROACETATE 250 MG IV SOLR
INTRAVENOUS | Status: AC
  Filled 2019-11-23: qty 250

## 2019-11-23 MED ORDER — FAMOTIDINE IN NACL 20-0.9 MG/50ML-% IV SOLN: 20.0000 mg | Freq: Two times a day (BID) | INTRAVENOUS | Status: DC

## 2019-11-23 MED ORDER — HYDROCORTISONE NA SUCCINATE PF 100 MG IJ SOLR
50.0000 mg | Freq: Four times a day (QID) | INTRAMUSCULAR | Status: DC
  Administered 2019-11-24 – 2019-11-27 (×14): 50 mg via INTRAVENOUS
  Filled 2019-11-23 (×14): qty 2

## 2019-11-23 MED ORDER — NOREPINEPHRINE 4 MG/250ML-% IV SOLN
INTRAVENOUS | Status: AC
  Filled 2019-11-23: qty 250

## 2019-11-23 MED ORDER — SODIUM CHLORIDE 0.9 % IV BOLUS: 500.0000 mL | Freq: Once | INTRAVENOUS | Status: DC

## 2019-11-23 MED ORDER — ENOXAPARIN SODIUM 40 MG/0.4ML ~~LOC~~ SOLN: 40.0000 mg | SUBCUTANEOUS | Status: DC

## 2019-11-23 MED ORDER — PROPOFOL 1000 MG/100ML IV EMUL
5.0000 ug/kg/min | INTRAVENOUS | Status: DC
  Administered 2019-11-24: 03:00:00 50 ug/kg/min via INTRAVENOUS
  Administered 2019-11-24: 21:00:00 25 ug/kg/min via INTRAVENOUS
  Administered 2019-11-25 (×2): 30 ug/kg/min via INTRAVENOUS
  Administered 2019-11-25: 03:00:00 25 ug/kg/min via INTRAVENOUS
  Administered 2019-11-26: 07:00:00 30 ug/kg/min via INTRAVENOUS
  Administered 2019-11-26: 22:00:00 60 ug/kg/min via INTRAVENOUS
  Administered 2019-11-26 (×2): 50 ug/kg/min via INTRAVENOUS
  Administered 2019-11-26: 40.209 ug/kg/min via INTRAVENOUS
  Administered 2019-11-27 (×4): 60 ug/kg/min via INTRAVENOUS
  Administered 2019-11-27: 13:00:00 40.209 ug/kg/min via INTRAVENOUS
  Administered 2019-11-27: 02:00:00 60 ug/kg/min via INTRAVENOUS
  Administered 2019-11-27: 10:00:00 40.209 ug/kg/min via INTRAVENOUS
  Administered 2019-11-28: 80 ug/kg/min via INTRAVENOUS
  Administered 2019-11-28 (×2): 50 ug/kg/min via INTRAVENOUS
  Administered 2019-11-28: 70 ug/kg/min via INTRAVENOUS
  Administered 2019-11-28: 50 ug/kg/min via INTRAVENOUS
  Administered 2019-11-28: 70 ug/kg/min via INTRAVENOUS
  Administered 2019-11-28 (×3): 80 ug/kg/min via INTRAVENOUS
  Administered 2019-11-29: 04:00:00 50 ug/kg/min via INTRAVENOUS
  Administered 2019-11-29: 01:00:00 60 ug/kg/min via INTRAVENOUS
  Filled 2019-11-23 (×30): qty 100

## 2019-11-23 MED ORDER — SODIUM CHLORIDE 0.9 % IV SOLN: INTRAVENOUS | Status: DC

## 2019-11-23 MED ORDER — IOHEXOL 300 MG/ML  SOLN
INTRAMUSCULAR | Status: DC | PRN
  Administered 2019-11-23: 230 mL

## 2019-11-23 MED ORDER — AMIODARONE HCL IN DEXTROSE 360-4.14 MG/200ML-% IV SOLN: 30.0000 mg/h | INTRAVENOUS | Status: DC

## 2019-11-23 MED ORDER — HEPARIN (PORCINE) IN NACL 1000-0.9 UT/500ML-% IV SOLN
INTRAVENOUS | Status: AC
  Filled 2019-11-23: qty 1000

## 2019-11-23 MED ORDER — METOPROLOL TARTRATE 5 MG/5ML IV SOLN
INTRAVENOUS | Status: DC | PRN
  Administered 2019-11-23: 5 mg via INTRAVENOUS

## 2019-11-23 MED ORDER — ASPIRIN 81 MG PO CHEW
CHEWABLE_TABLET | ORAL | Status: AC
  Filled 2019-11-23: qty 4

## 2019-11-23 MED ORDER — ONDANSETRON HCL 4 MG/2ML IJ SOLN: 4.0000 mg | Freq: Four times a day (QID) | INTRAMUSCULAR | Status: DC | PRN

## 2019-11-23 MED ORDER — POTASSIUM CHLORIDE 10 MEQ/100ML IV SOLN
INTRAVENOUS | Status: AC | PRN
  Administered 2019-11-23 (×2): 10 meq via INTRAVENOUS

## 2019-11-23 MED ORDER — FENTANYL CITRATE (PF) 100 MCG/2ML IJ SOLN
INTRAMUSCULAR | Status: DC | PRN
  Administered 2019-11-23: 50 ug via INTRAVENOUS

## 2019-11-23 MED ORDER — SODIUM CHLORIDE 0.9 % IV BOLUS
1000.0000 mL | Freq: Once | INTRAVENOUS | Status: AC
  Administered 2019-11-23: 1000 mL via INTRAVENOUS

## 2019-11-23 MED ORDER — ASPIRIN 300 MG RE SUPP: 300.0000 mg | RECTAL | Status: DC

## 2019-11-23 MED ORDER — FENTANYL 2500MCG IN NS 250ML (10MCG/ML) PREMIX INFUSION
INTRAVENOUS | Status: AC
  Administered 2019-11-23: 17:00:00 25 ug/h
  Filled 2019-11-23: qty 250

## 2019-11-23 MED ORDER — MIDAZOLAM HCL 2 MG/2ML IJ SOLN: 2.0000 mg | Freq: Once | INTRAMUSCULAR | Status: AC

## 2019-11-23 MED ORDER — FENTANYL CITRATE (PF) 100 MCG/2ML IJ SOLN: 50.0000 ug | Freq: Once | INTRAMUSCULAR | Status: DC

## 2019-11-23 MED ORDER — STERILE WATER FOR INJECTION IV SOLN
INTRAVENOUS | Status: DC
  Filled 2019-11-23 (×6): qty 850

## 2019-11-23 MED ORDER — FENTANYL BOLUS VIA INFUSION
50.0000 ug | INTRAVENOUS | Status: DC | PRN
  Administered 2019-11-25 (×2): 50 ug via INTRAVENOUS
  Filled 2019-11-23: qty 50

## 2019-11-23 MED ORDER — MIDAZOLAM HCL 2 MG/2ML IJ SOLN
2.0000 mg | INTRAMUSCULAR | Status: DC | PRN
  Administered 2019-11-24 – 2019-11-30 (×7): 2 mg via INTRAVENOUS
  Filled 2019-11-23 (×9): qty 2

## 2019-11-23 MED ORDER — SODIUM CHLORIDE 0.9 % IV SOLN
INTRAVENOUS | Status: AC | PRN
  Administered 2019-11-23: 100 mL/h via INTRAVENOUS

## 2019-11-23 MED ORDER — SODIUM CHLORIDE 0.9 % IV SOLN: 250.0000 mL | INTRAVENOUS | Status: DC | PRN

## 2019-11-23 MED ORDER — PIPERACILLIN-TAZOBACTAM 3.375 G IVPB
3.3750 g | Freq: Three times a day (TID) | INTRAVENOUS | Status: DC
  Administered 2019-11-23 – 2019-11-29 (×17): 3.375 g via INTRAVENOUS
  Filled 2019-11-23 (×18): qty 50

## 2019-11-23 MED ORDER — CANGRELOR TETRASODIUM 50 MG IV SOLR
INTRAVENOUS | Status: AC
  Filled 2019-11-23: qty 50

## 2019-11-23 MED ORDER — ORAL CARE MOUTH RINSE
15.0000 mL | OROMUCOSAL | Status: DC
  Administered 2019-11-23 – 2019-11-24 (×5): 15 mL via OROMUCOSAL

## 2019-11-23 MED ORDER — SODIUM CHLORIDE 0.9% FLUSH
3.0000 mL | Freq: Two times a day (BID) | INTRAVENOUS | Status: DC
  Administered 2019-11-23 – 2019-11-25 (×4): 3 mL via INTRAVENOUS

## 2019-11-23 SURGICAL SUPPLY — 16 items
BALLN EUPHORA RX 3.0X15 (BALLOONS) ×2
BALLOON EUPHORA RX 3.0X15 (BALLOONS) ×1 IMPLANT
CATH INFINITI 5FR ANG PIGTAIL (CATHETERS) ×2 IMPLANT
CATH INFINITI 5FR JL4 (CATHETERS) ×2 IMPLANT
CATH INFINITI JR4 5F (CATHETERS) ×2 IMPLANT
CATH VISTA GUIDE 6FR JR4 (CATHETERS) ×2 IMPLANT
CATH VISTA GUIDE 6FR XB3.5 (CATHETERS) IMPLANT
DEVICE CLOSURE MYNXGRIP 6/7F (Vascular Products) ×2 IMPLANT
DEVICE INFLAT 30 PLUS (MISCELLANEOUS) ×2 IMPLANT
KIT MANI 3VAL PERCEP (MISCELLANEOUS) ×2 IMPLANT
NEEDLE PERC 18GX7CM (NEEDLE) ×2 IMPLANT
PACK CARDIAC CATH (CUSTOM PROCEDURE TRAY) ×2 IMPLANT
SHEATH AVANTI 6FR X 11CM (SHEATH) ×6 IMPLANT
STENT RESOLUTE ONYX 3.0X26 (Permanent Stent) ×2 IMPLANT
WIRE G HI TQ BMW 190 (WIRE) ×2 IMPLANT
WIRE GUIDERIGHT .035X150 (WIRE) ×2 IMPLANT

## 2019-11-23 NOTE — ED Notes (Signed)
Leaving ER headed to Cath Lab

## 2019-11-23 NOTE — Progress Notes (Signed)
Pharmacy Antibiotic Note  Kurt [name] is a 63 y.o. male admitted on 11/23/2019 with aspiration PNA .  Pharmacy has been consulted for Zosyn dosing.  Plan: Zosyn 3.375g IV q8h (4 hour infusion).  Height: 5\' 9"  (175.3 cm) Weight: 182 lb 12.2 oz (82.9 kg) IBW/kg (Calculated) : 70.7  No data recorded.  Recent Labs  Lab 11/23/19 1627  WBC 20.6*  CREATININE 1.39*    Estimated Creatinine Clearance: 55.8 mL/min (A) (by C-G formula based on SCr of 1.39 mg/dL (H)).    No Known Allergies  Antimicrobials this admission:   >>    >>   Dose adjustments this admission:   Microbiology results:  BCx:   UCx:    Sputum:    MRSA PCR:   Thank you for allowing pharmacy to be a part of this patient's care.  Kurt [name] D 11/23/2019 10:10 PM

## 2019-11-23 NOTE — ED Notes (Signed)
20 of etomidate given Bertha Stakes RN verified by charge RN angela 100 of rocuronium given by Bertha Stakes RN  Verified by charge RN angela

## 2019-11-23 NOTE — Procedures (Signed)
Arterial Catheter Insertion Procedure Note Kurt [name] [id number] [birth year]  Procedure: Insertion of Arterial Catheter  Indications: Blood pressure monitoring and Frequent blood sampling  Procedure Details Consent: Unable to obtain consent because of emergent medical necessity. Time Out: Verified patient identification, verified procedure, site/side was marked, verified correct patient position, special equipment/implants available, medications/allergies/relevent history reviewed, required imaging and test results available.  Performed  Maximum sterile technique was used including antiseptics, cap, gloves, gown, hand hygiene, mask and sheet. Skin prep: Chlorhexidine; local anesthetic administered 20 gauge catheter was inserted into left femoral artery using the Seldinger technique. ULTRASOUND GUIDANCE USED: YES Evaluation Blood flow good; BP tracing good. Complications: No apparent complications.    Ultrasound used for direct visualization of cannulization of Left Femoral Artery.   [name], AGACNP-BC [facility] Pager: [phone]  Kurt [name] 11/23/2019

## 2019-11-23 NOTE — ED Notes (Signed)
1000ML NS bolus started verbal order from MD

## 2019-11-23 NOTE — ED Notes (Signed)
Cath Lab called to state they are ready for pt, however pt has not yet been cleared of Cspine and head. Dr. Jari Pigg aware and this RN standing by to transport as soon as pt is cleared.

## 2019-11-23 NOTE — Progress Notes (Signed)
RT assisted with patient transport to cath lab with no complications.

## 2019-11-23 NOTE — Procedures (Signed)
Central Venous Catheter Insertion Procedure Note Kurt [name] [id number] [birth year]  Procedure: Insertion of Central Venous Catheter Indications: Assessment of intravascular volume, Drug and/or fluid administration and Frequent blood sampling  Procedure Details Consent: Unable to obtain consent because of emergent medical necessity. Time Out: Verified patient identification, verified procedure, site/side was marked, verified correct patient position, special equipment/implants available, medications/allergies/relevent history reviewed, required imaging and test results available.  Performed  Maximum sterile technique was used including antiseptics, cap, gloves, gown, hand hygiene, mask and sheet. Skin prep: Chlorhexidine; local anesthetic administered A antimicrobial bonded/coated triple lumen catheter was placed in the right internal jugular vein using the Seldinger technique.  Evaluation Blood flow good Complications: No apparent complications Patient did tolerate procedure well. Chest X-ray ordered to verify placement.  CXR: pending.   Procedure was performed using Ultrasound for direct visualization of cannulization of Right IJ.  Line was secured at the 17 cm mark.     Kurt [name], AGACNP-BC [facility] Pager: [phone]  Kurt [name] 11/23/2019, 9:24 PM

## 2019-11-23 NOTE — ED Notes (Signed)
Ems CBG 265

## 2019-11-23 NOTE — ED Notes (Signed)
Fentanyl started at 73mcg/hr

## 2019-11-23 NOTE — ED Notes (Signed)
Dr. Clayborn Bigness to CT room to see pt

## 2019-11-23 NOTE — Consult Note (Signed)
Reason for Consult: Head laceration Referring Physician: [name], nurse practitioner, ICU  Kurt [name] is an 63 y.o. male.  HPI: He presented to the emergency department earlier today after suffering cardiac arrest.  He was apparently using cocaine and went outside to have a cigarette.  The person with whom he was using heard a thud.  He was in V. fib arrest when EMS arrived.  They were able to resuscitate him.  On presentation to the emergency department, cardiology was consulted he was taken to the Cath Lab after confirming no intracranial bleed.  When he arrived to the intensive care unit, the nurse practitioner on duty identified a head laceration and requested general surgery's assistance in managing it.  At the time of my assessment, the patient is intubated and sedated, undergoing cooling.  Past Medical History:  Diagnosis Date  . COPD (chronic obstructive pulmonary disease) ([place])   . ED (erectile dysfunction)   . Hypertension   . Lumbar disc disease     Past Surgical History:  Procedure Laterality Date  . APPENDECTOMY      Family History  Problem Relation Age of Onset  . Hyperlipidemia Father   . Cancer Father   . Heart disease Father        heart valve disease (from Agent Orange?)  . Alcohol abuse Mother   . Hypertension Neg Hx   . Diabetes Neg Hx   . Colon cancer Neg Hx   . Colon polyps Neg Hx   . Pancreatic cancer Neg Hx   . Rectal cancer Neg Hx   . Stomach cancer Neg Hx     Social History:  reports that he has been smoking. He has never used smokeless tobacco. He reports current alcohol use. He reports current drug use. Drug: Marijuana.  Allergies: No Known Allergies  Medications: I have reviewed the patient's current medications.  Results for orders placed or performed during the hospital encounter of 11/23/19 (from the past 48 hour(s))  Respiratory Panel by RT PCR (Flu A&B, Covid) - Nasopharyngeal Swab     Status: None   Collection Time: 11/23/19   4:24 PM   Specimen: Nasopharyngeal Swab  Result Value Ref Range   SARS Coronavirus 2 by RT PCR NEGATIVE NEGATIVE    Comment: (NOTE) SARS-CoV-2 target nucleic acids are NOT DETECTED. The SARS-CoV-2 RNA is generally detectable in upper respiratoy specimens during the acute phase of infection. The lowest concentration of SARS-CoV-2 viral copies this assay can detect is 131 copies/mL. A negative result does not preclude SARS-Cov-2 infection and should not be used as the sole basis for treatment or other patient management decisions. A negative result may occur with  improper specimen collection/handling, submission of specimen other than nasopharyngeal swab, presence of viral mutation(s) within the areas targeted by this assay, and inadequate number of viral copies (<131 copies/mL). A negative result must be combined with clinical observations, patient history, and epidemiological information. The expected result is Negative. Fact Sheet for Patients:  PinkCheek.be Fact Sheet for Healthcare Providers:  GravelBags.it This test is not yet ap proved or cleared by the [place] FDA and  has been authorized for detection and/or diagnosis of SARS-CoV-2 by FDA under an Emergency Use Authorization (EUA). This EUA will remain  in effect (meaning this test can be used) for the duration of the COVID-19 declaration under Section 564(b)(1) of the Act, 21 U.S.C. section 360bbb-3(b)(1), unless the authorization is terminated or revoked sooner.    Influenza A by PCR NEGATIVE NEGATIVE  Influenza B by PCR NEGATIVE NEGATIVE    Comment: (NOTE) The Xpert Xpress SARS-CoV-2/FLU/RSV assay is intended as an aid in  the diagnosis of influenza from Nasopharyngeal swab specimens and  should not be used as a sole basis for treatment. Nasal washings and  aspirates are unacceptable for Xpert Xpress SARS-CoV-2/FLU/RSV  testing. Fact Sheet for  Patients: PinkCheek.be Fact Sheet for Healthcare Providers: GravelBags.it This test is not yet approved or cleared by the [place] FDA and  has been authorized for detection and/or diagnosis of SARS-CoV-2 by  FDA under an Emergency Use Authorization (EUA). This EUA will remain  in effect (meaning this test can be used) for the duration of the  Covid-19 declaration under Section 564(b)(1) of the Act, 21  U.S.C. section 360bbb-3(b)(1), unless the authorization is  terminated or revoked. Performed at [facility], [address] [place], [place] [address]   Magnesium     Status: None   Collection Time: 11/23/19  4:24 PM  Result Value Ref Range   Magnesium 2.3 1.7 - 2.4 mg/dL    Comment: Performed at [facility], [address] [place], [place] [address]  CBC with Differential/Platelet     Status: Abnormal   Collection Time: 11/23/19  4:27 PM  Result Value Ref Range   WBC 20.6 (H) 4.0 - 10.5 K/uL   RBC 5.01 4.22 - 5.81 MIL/uL   Hemoglobin 16.8 13.0 - 17.0 g/dL   HCT 50.1 39.0 - 52.0 %   MCV 100.0 80.0 - 100.0 fL   MCH 33.5 26.0 - 34.0 pg   MCHC 33.5 30.0 - 36.0 g/dL   RDW 13.0 11.5 - 15.5 %   Platelets 195 150 - 400 K/uL   nRBC 0.1 0.0 - 0.2 %   Neutrophils Relative % 65 %   Lymphocytes Relative 22 %   Monocytes Relative 6 %   Eosinophils Relative 2 %   Basophils Relative 1 %   Band Neutrophils 0 %   Metamyelocytes Relative 0 %   Myelocytes 0 %   Promyelocytes Relative 0 %   Blasts 0 %   nRBC 0 0 /100 WBC   Other 4 %   Neutro Abs 13.4 (H) 1.7 - 7.7 K/uL   Lymphs Abs 4.5 (H) 0.7 - 4.0 K/uL   Monocytes Absolute 1.2 (H) 0.1 - 1.0 K/uL   Eosinophils Absolute 0.4 0.0 - 0.5 K/uL   Basophils Absolute 0.2 (H) 0.0 - 0.1 K/uL   Abs Immature Granulocytes 0.00 0.00 - 0.07 K/uL    Comment: Performed at [facility], [address] [place], [place] [address]  Protime-INR     Status:  Abnormal   Collection Time: 11/23/19  4:27 PM  Result Value Ref Range   Prothrombin Time 15.8 (H) 11.4 - 15.2 seconds   INR 1.3 (H) 0.8 - 1.2    Comment: (NOTE) INR goal varies based on device and disease states. Performed at [facility], [address] [place], [place] [address]   APTT     Status: Abnormal   Collection Time: 11/23/19  4:27 PM  Result Value Ref Range   aPTT 42 (H) 24 - 36 seconds    Comment:        IF BASELINE aPTT IS ELEVATED, SUGGEST PATIENT RISK ASSESSMENT BE USED TO DETERMINE APPROPRIATE ANTICOAGULANT THERAPY. Performed at [facility], [address], [place], [place] [address]   Comprehensive metabolic panel     Status: Abnormal   Collection Time:  11/23/19  4:27 PM  Result Value Ref Range   Sodium 141 135 - 145 mmol/L   Potassium 2.8 (L) 3.5 - 5.1 mmol/L    Comment: HEMOLYSIS AT THIS LEVEL MAY AFFECT RESULT   Chloride 105 98 - 111 mmol/L   CO2 23 22 - 32 mmol/L   Glucose, Bld 332 (H) 70 - 99 mg/dL   BUN 23 8 - 23 mg/dL   Creatinine, Ser 1.39 (H) 0.61 - 1.24 mg/dL   Calcium 8.1 (L) 8.9 - 10.3 mg/dL   Total Protein 6.6 6.5 - 8.1 g/dL   Albumin 3.3 (L) 3.5 - 5.0 g/dL   AST 65 (H) 15 - 41 U/L   ALT 42 0 - 44 U/L   Alkaline Phosphatase 95 38 - 126 U/L   Total Bilirubin 0.7 0.3 - 1.2 mg/dL   GFR calc non Af Amer 54 (L) >60 mL/min   GFR calc Af Amer >60 >60 mL/min   Anion gap 13 5 - 15    Comment: Performed at [facility], [address] [place], [place] [address]  Troponin I (High Sensitivity)     Status: Abnormal   Collection Time: 11/23/19  4:27 PM  Result Value Ref Range   Troponin I (High Sensitivity) 205 (HH) <18 ng/L    Comment: CRITICAL RESULT CALLED TO, READ BACK BY AND VERIFIED WITH ANGELA ROBBINS 11/23/19 @ 1718  MLK (NOTE) Elevated high sensitivity troponin I (hsTnI) values and significant  changes across serial measurements may suggest ACS but many other  chronic and acute conditions are known to  elevate hsTnI results.  Refer to the "Links" section for chest pain algorithms and additional  guidance. Performed at [facility], [address] [place], [place] [address]   Lipid panel     Status: None   Collection Time: 11/23/19  4:27 PM  Result Value Ref Range   Cholesterol 157 0 - 200 mg/dL   Triglycerides 110 <150 mg/dL   HDL 43 >40 mg/dL   Total CHOL/HDL Ratio 3.7 RATIO   VLDL 22 0 - 40 mg/dL   LDL Cholesterol 92 0 - 99 mg/dL    Comment:        Total Cholesterol/HDL:CHD Risk Coronary Heart Disease Risk Table                     Men   Women  1/2 Average Risk   3.4   3.3  Average Risk       5.0   4.4  2 X Average Risk   9.6   7.1  3 X Average Risk  23.4   11.0        Use the calculated Patient Ratio above and the CHD Risk Table to determine the patient's CHD Risk.        ATP III CLASSIFICATION (LDL):  <100     mg/dL   Optimal  100-129  mg/dL   Near or Above                    Optimal  130-159  mg/dL   Borderline  160-189  mg/dL   High  >190     mg/dL   Very High Performed at [facility], [address], [place], [place] [address]   Urine Drug Screen, Qualitative [facility] only)     Status: None   Collection Time: 11/23/19  4:27 PM  Result Value Ref Range   Tricyclic, Ur Screen NONE DETECTED  NONE DETECTED   Amphetamines, Ur Screen NONE DETECTED NONE DETECTED   MDMA (Ecstasy)Ur Screen NONE DETECTED NONE DETECTED   Cocaine Metabolite,Ur [place] NONE DETECTED NONE DETECTED   Opiate, Ur Screen NONE DETECTED NONE DETECTED   Phencyclidine (PCP) Ur S NONE DETECTED NONE DETECTED   Cannabinoid 50 Ng, Ur [place] NONE DETECTED NONE DETECTED   Barbiturates, Ur Screen NONE DETECTED NONE DETECTED   Benzodiazepine, Ur Scrn NONE DETECTED NONE DETECTED   Methadone Scn, Ur NONE DETECTED NONE DETECTED    Comment: (NOTE) Tricyclics + metabolites, urine    Cutoff 1000 ng/mL Amphetamines + metabolites, urine  Cutoff 1000 ng/mL MDMA (Ecstasy), urine              Cutoff 500  ng/mL Cocaine Metabolite, urine          Cutoff 300 ng/mL Opiate + metabolites, urine        Cutoff 300 ng/mL Phencyclidine (PCP), urine         Cutoff 25 ng/mL Cannabinoid, urine                 Cutoff 50 ng/mL Barbiturates + metabolites, urine  Cutoff 200 ng/mL Benzodiazepine, urine              Cutoff 200 ng/mL Methadone, urine                   Cutoff 300 ng/mL The urine drug screen provides only a preliminary, unconfirmed analytical test result and should not be used for non-medical purposes. Clinical consideration and professional judgment should be applied to any positive drug screen result due to possible interfering substances. A more specific alternate chemical method must be used in order to obtain a confirmed analytical result. Gas chromatography / mass spectrometry (GC/MS) is the preferred confirmat ory method. Performed at [facility], [address] [place], [place] [address]   Urinalysis, Routine w reflex microscopic     Status: Abnormal   Collection Time: 11/23/19  4:27 PM  Result Value Ref Range   Color, Urine YELLOW (A) YELLOW   APPearance CLOUDY (A) CLEAR   Specific Gravity, Urine 1.010 1.005 - 1.030   pH 7.0 5.0 - 8.0   Glucose, UA >=500 (A) NEGATIVE mg/dL   Hgb urine dipstick MODERATE (A) NEGATIVE   Bilirubin Urine NEGATIVE NEGATIVE   Ketones, ur NEGATIVE NEGATIVE mg/dL   Protein, ur 100 (A) NEGATIVE mg/dL   Nitrite NEGATIVE NEGATIVE   Leukocytes,Ua NEGATIVE NEGATIVE   RBC / HPF >50 (H) 0 - 5 RBC/hpf   WBC, UA 21-50 0 - 5 WBC/hpf   Bacteria, UA MANY (A) NONE SEEN   Squamous Epithelial / LPF 0-5 0 - 5   Mucus PRESENT     Comment: Performed at [facility], [address] [place], [place] [address]  Blood gas, arterial ([facility] & [facility] ONLY)     Status: Abnormal   Collection Time: 11/23/19  4:47 PM  Result Value Ref Range   FIO2 1.00    Delivery systems VENTILATOR    Mode ASSIST CONTROL    VT 550 mL   LHR 18 resp/min   Peep/cpap 10.0  cm H20   pH, Arterial 7.09 (LL) 7.350 - 7.450    Comment: CRITICAL RESULT CALLED TO, READ BACK BY AND VERIFIED WITH:  NICK ELINSKI RRT @ 1706 ON 11/23/19 BY SNM    pCO2 arterial 70 (HH) 32.0 - 48.0 mmHg    Comment: CRITICAL RESULT CALLED TO, READ BACK BY AND  VERIFIED WITH:  NICK ELINSKI RRT @ 1706 ON 11/23/19 BY SNM    pO2, Arterial 55 (L) 83.0 - 108.0 mmHg   Bicarbonate 21.2 20.0 - 28.0 mmol/L   Acid-base deficit 9.7 (H) 0.0 - 2.0 mmol/L   O2 Saturation 74.0 %   Patient temperature 37.0    Collection site LEFT RADIAL    Sample type ARTERIAL DRAW    Allens test (pass/fail) PASS PASS   Mechanical Rate 18     Comment: Performed at [facility], [address], [place], [place] [address]  POCT Activated clotting time     Status: None   Collection Time: 11/23/19  6:30 PM  Result Value Ref Range   Activated Clotting Time 560 seconds  Glucose, capillary     Status: Abnormal   Collection Time: 11/23/19  8:00 PM  Result Value Ref Range   Glucose-Capillary 170 (H) 70 - 99 mg/dL    DG Chest 1 View  Result Date: 11/23/2019 CLINICAL DATA:  Intubation. Cardiac arrest. EXAM: CHEST  1 VIEW COMPARISON:  03/16/2018 FINDINGS: Endotracheal tube is in good position 3 cm above the carina. Heart size and pulmonary vascularity are normal. Slight haziness in the mid and upper lung zones may represent mild pulmonary edema. No effusions. No pneumothorax. IMPRESSION: 1. Endotracheal tube in good position. 2. Hazy infiltrates in the mid and upper lung zones may represent mild pulmonary edema. Aspiration pneumonitis could also give this appearance. Electronically Signed   By: Lorriane Shire M.D.   On: 11/23/2019 17:24   CT Head Wo Contrast  Result Date: 11/23/2019 CLINICAL DATA:  Head trauma. Cardiac arrest. EXAM: CT HEAD WITHOUT CONTRAST CT CERVICAL SPINE WITHOUT CONTRAST TECHNIQUE: Multidetector CT imaging of the head and cervical spine was performed following the standard protocol without  intravenous contrast. Multiplanar CT image reconstructions of the cervical spine were also generated. COMPARISON:  None. FINDINGS: CT HEAD FINDINGS Brain: No evidence of acute infarction, hemorrhage, hydrocephalus, extra-axial collection or mass lesion/mass effect. Vascular: No hyperdense vessel or unexpected calcification. Skull: Normal. Negative for fracture or focal lesion. Sinuses/Orbits: No acute finding. Evidence of previous maxillary sinus surgery. Slight mucosal thickening in the ethmoid air cells. Other: None CT CERVICAL SPINE FINDINGS Alignment: Normal. Skull base and vertebrae: No acute fracture. No primary bone lesion or focal pathologic process. Soft tissues and spinal canal: No prevertebral fluid or swelling. No visible canal hematoma. Disc levels: There are no disc protrusions or significant disc bulges in the cervical spine. There is no spinal stenosis. There is moderate left foraminal stenosis at C6-7 due to facet arthritis. The neural foramina throughout the remainder of the cervical spine are widely patent. Moderate right facet arthritis at C3-4. Upper chest: Focal area of infiltrate at the right lung apex posteriorly. Patchy haziness in both lung apices. Endotracheal tube in place. Other: None IMPRESSION: 1. Normal CT scan of the head. 2. No significant abnormality of the cervical spine. Moderate left foraminal stenosis at C6-7. Electronically Signed   By: Lorriane Shire M.D.   On: 11/23/2019 17:30   CT Cervical Spine Wo Contrast  Result Date: 11/23/2019 CLINICAL DATA:  Head trauma. Cardiac arrest. EXAM: CT HEAD WITHOUT CONTRAST CT CERVICAL SPINE WITHOUT CONTRAST TECHNIQUE: Multidetector CT imaging of the head and cervical spine was performed following the standard protocol without intravenous contrast. Multiplanar CT image reconstructions of the cervical spine were also generated. COMPARISON:  None. FINDINGS: CT HEAD FINDINGS Brain: No evidence of acute infarction, hemorrhage,  hydrocephalus, extra-axial collection or  mass lesion/mass effect. Vascular: No hyperdense vessel or unexpected calcification. Skull: Normal. Negative for fracture or focal lesion. Sinuses/Orbits: No acute finding. Evidence of previous maxillary sinus surgery. Slight mucosal thickening in the ethmoid air cells. Other: None CT CERVICAL SPINE FINDINGS Alignment: Normal. Skull base and vertebrae: No acute fracture. No primary bone lesion or focal pathologic process. Soft tissues and spinal canal: No prevertebral fluid or swelling. No visible canal hematoma. Disc levels: There are no disc protrusions or significant disc bulges in the cervical spine. There is no spinal stenosis. There is moderate left foraminal stenosis at C6-7 due to facet arthritis. The neural foramina throughout the remainder of the cervical spine are widely patent. Moderate right facet arthritis at C3-4. Upper chest: Focal area of infiltrate at the right lung apex posteriorly. Patchy haziness in both lung apices. Endotracheal tube in place. Other: None IMPRESSION: 1. Normal CT scan of the head. 2. No significant abnormality of the cervical spine. Moderate left foraminal stenosis at C6-7. Electronically Signed   By: Lorriane Shire M.D.   On: 11/23/2019 17:30   CARDIAC CATHETERIZATION  Result Date: 11/23/2019  Prox LAD lesion is 50% stenosed.  Prox RCA lesion is 100% stenosed. Infarct-related artery TIMI 0 flow  Dist Cx lesion is 90% stenosed.  Normal left ventricular function ejection fraction between 50 and 55%  Conclusion STEMI presentation 100% proximal RCA TIMI 0 flow Successful PCI and stent with DES 3.0 x 26 mm resolute Onyx to 13 atm to proximal RCA restoring TIMI-3 flow Left main free of disease LAD large with a proximal 50% lesion Circumflex very large with a distal 90% distal circumflex lesion Mixed dominant system Preserved left ventricular function of 50 to 55% Circumflex lesion intervention was deferred    Review of Systems   Unable to perform ROS: Intubated   Blood pressure (!) 78/62, pulse 64, resp. rate 18, height '5\' 9"'  (1.753 m), weight 82.9 kg, SpO2 94 %. Physical Exam  HENT:  There is blood all over the back of the patient's head as well as on his forehead and face.  Once this was cleaned, there was no laceration identified in the back of the head, however there is an approximately 3 cm laceration just above his right eye.  Eyes:  Eyes are closed  Neck:  Intubated  Cardiovascular: Normal rate and regular rhythm.  Respiratory:  Intubated, on a ventilator  GI: Soft.  Cooling vest in place  Genitourinary:    Genitourinary Comments: Deferred   Musculoskeletal:        General: No edema.  Neurological:  Sedated  Psychiatric:  Unable to assess, secondary to sedation    Assessment/Plan: This is a 63 year old man who had a cardiac arrest earlier today.  He fell and struck his forehead.  The 3 cm laceration warrants closure.  This was accomplished at the bedside after thoroughly cleaning the area.  4 staples were placed and a gauze dressing applied. -The dressing may be changed as needed. - Please contact surgery when the patient is ready to be discharged, we will determine if the staples are ready to be removed at that time.  Fredirick Maudlin 11/23/2019, 9:49 PM

## 2019-11-23 NOTE — ED Triage Notes (Addendum)
Pt to ed via ems after cardiac arrest. Pt was found on scene with lit cigarrete, son state cardaic hx unsure. Pt was found in vfib and has been shocked x6 by EMS, 3 rounds of epi, 300mg  amioderone bolus and a 186mL infusion running. PT arrives with spontaneous respirations but ST elevation on monitor PT arrives with [facility] airway and IO to Right tibia

## 2019-11-23 NOTE — H&P (Signed)
Name: Kurt [name] MRN: [id number] DOB: 1955-12-04     CONSULTATION DATE: 11/23/2019  REFERRING MD :  Jari Pigg  CHIEF COMPLAINT:  Cardiac arrest   HISTORY OF PRESENT ILLNESS:   Patient reportedly had been using cocaine earlier or yesterday went outside to smoke a cigarette and reportedly arrested and was found on the floor he suffered injury to his face EMS was called his initial rhythm was V. fib he was shocked producing a wide-complex rhythm x 6. TOTAL DOWN TIME SEEMS TO BE APPROX 20 MINS  Code STEMI was initiated patient was brought to the emergency room after quick evaluation by the ER the elected to intubate and sedate him to protect his airway he also went to CT to evaluate for any head injury and to rule out CVA patient was then released to have cardiac cath for evaluation of possible STEMI.  Initial high-sensitivity troponin was 230 but this was post defibrillation.  Patient reportedly received amiodarone in the field 300 then 150.  Unclear if he received aspirin.  His CT Head is negative for any intracranial abnormality, and CT Cervical Spine is negative for any significant abnormality, only with moderate left foraminal stenosis at C6-7.  CXR with Hazy infiltrates in the mid and upper lung zones concerning for mild pulmonary edema vs aspiration.  COVID NEG Critically ill on vent Patient is candidate for hypothermia protocol  ER NURSE REPORT Pt was found on scene with lit cigarrete, son state cardaic hx unsure. Pt was found in vfib and has been shocked x6 by EMS, 3 rounds of epi, 358m amioderone bolus and a 1570minfusion running. PT arrives with spontaneous respirations but ST elevation on monitor [id number] and IO to Right tibia  SIGNIFICANT EVENTS 12/24 - intubated/Vent support, cardiac arrest, s/p CATH-100% OCCLUSION OF RCA, COCAINE TOXICITY 12/24 - HYPOTHERMIA PROTOCOL 12/24 - Critically ill, requiring 3 vasopressors, 100% FiO2 & 12 PEEP  PAST MEDICAL  HISTORY :   has a past medical history of COPD (chronic obstructive pulmonary disease) ([facility] (e[facility] dysfunction), Hypertension, and Lumbar disc disease.  has a past surgical history that includes Appendectomy. Prior to Admission medications   Medication Sig Start Date End Date Taking? Authorizing Provider  aspirin 81 MG tablet Take 81 mg by mouth daily.   Yes [provider]  hydrochlorothiazide (HYDRODIURIL) 12.5 MG tablet Take 1 tablet by mouth once daily 11/20/19  Yes LeVenia CarbonMD  losartan (COZAAR) 50 MG tablet TAKE 1 TABLET BY MOUTH ONCE DAILY -  NEED  OFFICE  VISIT 11/02/19  Yes LeVenia CarbonMD   No Known Allergies  FAMILY HISTORY:  family history includes Alcohol abuse in his mother; Cancer in his father; Heart disease in his father; Hyperlipidemia in his father. SOCIAL HISTORY:  reports that he has been smoking. He has never used smokeless tobacco. He reports current alcohol use. He reports current drug use. Drug: Marijuana.  REVIEW OF SYSTEMS:   Unable to obtain due to critical illness Other:  All other systems negative   VITAL SIGNS: Pulse Rate:  [95-102] 98 (12/24 1710) Resp:  [18-38] 20 (12/24 1712) BP: (61-143)/(39-97) 123/94 (12/24 1712) SpO2:  [81 %-100 %] 96 % (12/24 1804) FiO2 (%):  [100 %] 100 % (12/24 1804) Weight:  [86.2 kg] 86.2 kg (12/24 1628)   No intake/output data recorded. No intake/output data recorded.   SpO2: 96 % FiO2 (%): 100 %   Physical Examination:  GENERAL:critically ill appearing, sedated on  vent, in no acute distress  HEAD: Laceration above right eye, Normocephalic EYES: Pupils equal, round, reactive to light.  No scleral icterus.  MOUTH: Moist mucosal membrane. ETT in place NECK: Supple. No JVD.  PULMONARY: +rhonchi, +wheezing CARDIOVASCULAR: S1 and S2. Regular rate and rhythm. No murmurs, rubs, or gallops.  GASTROINTESTINAL: Soft, nontender, -distended. No masses. Positive bowel sounds. No  hepatosplenomegaly.  MUSCULOSKELETAL: No swelling, clubbing, or edema.  NEUROLOGIC: obtunded SKIN:intact,warm,dry  I personally reviewed lab work that was obtained in last 24 hrs. CXR Independently reviewed-ETT in place  MEDICATIONS: I have reviewed all medications and confirmed regimen as documented   CULTURE RESULTS   Recent Results (from the past 240 hour(s))  Respiratory Panel by RT PCR (Flu A&B, Covid) - Nasopharyngeal Swab     Status: None   Collection Time: 11/23/19  4:24 PM   Specimen: Nasopharyngeal Swab  Result Value Ref Range Status   SARS Coronavirus 2 by RT PCR NEGATIVE NEGATIVE Final    Comment: (NOTE) SARS-CoV-2 target nucleic acids are NOT DETECTED. The SARS-CoV-2 RNA is generally detectable in upper respiratoy specimens during the acute phase of infection. The lowest concentration of SARS-CoV-2 viral copies this assay can detect is 131 copies/mL. A negative result does not preclude SARS-Cov-2 infection and should not be used as the sole basis for treatment or other patient management decisions. A negative result may occur with  improper specimen collection/handling, submission of specimen other than nasopharyngeal swab, presence of viral mutation(s) within the areas targeted by this assay, and inadequate number of viral copies (<131 copies/mL). A negative result must be combined with clinical observations, patient history, and epidemiological information. The expected result is Negative. Fact Sheet for Patients:  PinkCheek.be Fact Sheet for Healthcare Providers:  GravelBags.it This test is not yet ap proved or cleared by the [place] FDA and  has been authorized for detection and/or diagnosis of SARS-CoV-2 by FDA under an Emergency Use Authorization (EUA). This EUA will remain  in effect (meaning this test can be used) for the duration of the COVID-19 declaration under Section 564(b)(1) of the Act,  21 U.S.C. section 360bbb-3(b)(1), unless the authorization is terminated or revoked sooner.    Influenza A by PCR NEGATIVE NEGATIVE Final   Influenza B by PCR NEGATIVE NEGATIVE Final    Comment: (NOTE) The Xpert Xpress SARS-CoV-2/FLU/RSV assay is intended as an aid in  the diagnosis of influenza from Nasopharyngeal swab specimens and  should not be used as a sole basis for treatment. Nasal washings and  aspirates are unacceptable for Xpert Xpress SARS-CoV-2/FLU/RSV  testing. Fact Sheet for Patients: PinkCheek.be Fact Sheet for Healthcare Providers: GravelBags.it This test is not yet approved or cleared by the [place] FDA and  has been authorized for detection and/or diagnosis of SARS-CoV-2 by  FDA under an Emergency Use Authorization (EUA). This EUA will remain  in effect (meaning this test can be used) for the duration of the  Covid-19 declaration under Section 564(b)(1) of the Act, 21  U.S.C. section 360bbb-3(b)(1), unless the authorization is  terminated or revoked. Performed at [facility], [address], [place], [place] [address]           IMAGING    DG Chest 1 View  Result Date: 11/23/2019 CLINICAL DATA:  Intubation. Cardiac arrest. EXAM: CHEST  1 VIEW COMPARISON:  03/16/2018 FINDINGS: Endotracheal tube is in good position 3 cm above the carina. Heart size and pulmonary vascularity are normal. Slight haziness in the mid and upper  lung zones may represent mild pulmonary edema. No effusions. No pneumothorax. IMPRESSION: 1. Endotracheal tube in good position. 2. Hazy infiltrates in the mid and upper lung zones may represent mild pulmonary edema. Aspiration pneumonitis could also give this appearance. Electronically Signed   By: Lorriane Shire M.D.   On: 11/23/2019 17:24   CT Head Wo Contrast  Result Date: 11/23/2019 CLINICAL DATA:  Head trauma. Cardiac arrest. EXAM: CT HEAD WITHOUT CONTRAST CT  CERVICAL SPINE WITHOUT CONTRAST TECHNIQUE: Multidetector CT imaging of the head and cervical spine was performed following the standard protocol without intravenous contrast. Multiplanar CT image reconstructions of the cervical spine were also generated. COMPARISON:  None. FINDINGS: CT HEAD FINDINGS Brain: No evidence of acute infarction, hemorrhage, hydrocephalus, extra-axial collection or mass lesion/mass effect. Vascular: No hyperdense vessel or unexpected calcification. Skull: Normal. Negative for fracture or focal lesion. Sinuses/Orbits: No acute finding. Evidence of previous maxillary sinus surgery. Slight mucosal thickening in the ethmoid air cells. Other: None CT CERVICAL SPINE FINDINGS Alignment: Normal. Skull base and vertebrae: No acute fracture. No primary bone lesion or focal pathologic process. Soft tissues and spinal canal: No prevertebral fluid or swelling. No visible canal hematoma. Disc levels: There are no disc protrusions or significant disc bulges in the cervical spine. There is no spinal stenosis. There is moderate left foraminal stenosis at C6-7 due to facet arthritis. The neural foramina throughout the remainder of the cervical spine are widely patent. Moderate right facet arthritis at C3-4. Upper chest: Focal area of infiltrate at the right lung apex posteriorly. Patchy haziness in both lung apices. Endotracheal tube in place. Other: None IMPRESSION: 1. Normal CT scan of the head. 2. No significant abnormality of the cervical spine. Moderate left foraminal stenosis at C6-7. Electronically Signed   By: Lorriane Shire M.D.   On: 11/23/2019 17:30   CT Cervical Spine Wo Contrast  Result Date: 11/23/2019 CLINICAL DATA:  Head trauma. Cardiac arrest. EXAM: CT HEAD WITHOUT CONTRAST CT CERVICAL SPINE WITHOUT CONTRAST TECHNIQUE: Multidetector CT imaging of the head and cervical spine was performed following the standard protocol without intravenous contrast. Multiplanar CT image reconstructions  of the cervical spine were also generated. COMPARISON:  None. FINDINGS: CT HEAD FINDINGS Brain: No evidence of acute infarction, hemorrhage, hydrocephalus, extra-axial collection or mass lesion/mass effect. Vascular: No hyperdense vessel or unexpected calcification. Skull: Normal. Negative for fracture or focal lesion. Sinuses/Orbits: No acute finding. Evidence of previous maxillary sinus surgery. Slight mucosal thickening in the ethmoid air cells. Other: None CT CERVICAL SPINE FINDINGS Alignment: Normal. Skull base and vertebrae: No acute fracture. No primary bone lesion or focal pathologic process. Soft tissues and spinal canal: No prevertebral fluid or swelling. No visible canal hematoma. Disc levels: There are no disc protrusions or significant disc bulges in the cervical spine. There is no spinal stenosis. There is moderate left foraminal stenosis at C6-7 due to facet arthritis. The neural foramina throughout the remainder of the cervical spine are widely patent. Moderate right facet arthritis at C3-4. Upper chest: Focal area of infiltrate at the right lung apex posteriorly. Patchy haziness in both lung apices. Endotracheal tube in place. Other: None IMPRESSION: 1. Normal CT scan of the head. 2. No significant abnormality of the cervical spine. Moderate left foraminal stenosis at C6-7. Electronically Signed   By: Lorriane Shire M.D.   On: 11/23/2019 17:30   CBC    Component Value Date/Time   WBC 20.6 (H) 11/23/2019 1627   RBC 5.01 11/23/2019 1627   HGB  16.8 11/23/2019 1627   HGB 14.9 02/26/2013 1217   HCT 50.1 11/23/2019 1627   HCT 44.2 02/26/2013 1217   PLT 195 11/23/2019 1627   PLT 204 02/26/2013 1217   MCV 100.0 11/23/2019 1627   MCV 95 02/26/2013 1217   MCH 33.5 11/23/2019 1627   MCHC 33.5 11/23/2019 1627   RDW 13.0 11/23/2019 1627   RDW 13.2 02/26/2013 1217   LYMPHSABS PENDING 11/23/2019 1627   MONOABS PENDING 11/23/2019 1627   EOSABS PENDING 11/23/2019 1627   BASOSABS PENDING  11/23/2019 1627   BMP Latest Ref Rng & Units 11/23/2019 03/16/2018 11/24/2016  Glucose 70 - 99 mg/dL 332(H) 88 94  BUN 8 - 23 mg/dL _0 Creatinine 0.61 - 1.24 mg/dL 1.39(H) 1.02 1.01  Sodium 135 - 145 mmol/L 141 141 141  Potassium 3.5 - 5.1 mmol/L 2.8(L) 4.0 4.8  Chloride 98 - 111 mmol/L 105 103 104  CO2 22 - 32 mmol/L 23 32 31  Calcium 8.9 - 10.3 mg/dL 8.1(L) 9.4 9.5        Indwelling Urinary Catheter continued, requirement due to   Reason to continue Indwelling Urinary Catheter strict Intake/Output monitoring for hemodynamic instability         Ventilator continued, requirement due to severe respiratory failure   Ventilator Sedation RASS 0 to -2      ASSESSMENT AND PLAN SYNOPSIS  63 yo white male with acute and severe hypoxic respiratory failure from acute V fib cardiac arrest and sudden cardiac death STEMI from acute RCA OCCLUSION and from COCAINE Toxicity.  Now with Cardiogenic shock requiring multiple vasopressors.  Concern for possible aspiration pneumonia.   Severe ACUTE Hypoxic and Hypercapnic Respiratory Failure -continue Full MV support -Maintain O2 sats >92% -Wean FiO2 & PEEP as tolerated -follow intermittent CXR & ABG as needed -SBT when respiratory parameters met -continue Bronchodilator Therapy   ACUTE CARDIAC FAILURE /STEMI Cardiogenic Shock +/- ? Septic shock -Continuous cardiac monitoring -Maintain MAP >65 -IV Fluids -Vasopressors as needed to maintain MAP goal -follow up cardiac enzymes as indicated -follow up cardiology recs -S/p cath with successful PCI of RCA -Obtain 2D Echocardiogram -Hold ACE-I & BB due to hypotension  ACUTE KIDNEY INJURY/Renal Failure -Monitor I&O's / urinary output -Follow BMP -Ensure adequate renal perfusion -Avoid nephrotoxic agents as able -Replace electrolytes as indicated -IV Fluids    NEUROLOGY - intubated and sedated - minimal sedation to achieve a RASS goal: -1 -Will START HYPOTHERMIA  PROTOCOL -Once Hypothermia protocol complete, daily WUA    ID ? Aspiration Pneumonia -Monitor fever curve -Trend WBC's and Procalcitonin -follow cultures  -Placed on empiric Zosyn  GI -NPO for now GI PROPHYLAXIS as indicated  NUTRITIONAL STATUS DIET-->HOLD Constipation protocol as indicated   ENDO Hyperglycemia -CBG's -SSI as needed - will use ICU hypoglycemic\Hyperglycemia protocol if needed   ELECTROLYTES -follow labs as needed -replace as needed -pharmacy consultation and following   DVT/GI PRX ordered TRANSFUSIONS AS NEEDED MONITOR FSBS ASSESS the need for LABS    Critical Care Time devoted to patient care services described in this note is 45 minutes.   Overall, patient is critically ill, prognosis is guarded.  Patient with Multiorgan failure and at high risk for cardiac arrest and death.     Corrin Parker, M.D.  [facility]  Medical Director [place] Director [facility] Cardio-Pulmonary Department

## 2019-11-23 NOTE — Progress Notes (Signed)
[facility] Progress Note Patient Name: Cedar Nardiello DOB: [birth year] MRN: [id number]   Date of Service  11/23/2019  HPI/Events of Note  34M who was BIBA after out-of-hospital VFib arrest in setting of cocaine use. He was found to have a complete occlusion of his RCA for which he underwent emergent cath with PCI.  He also had a head laceration for which he has received staples by surgery team.  CXR showed bilateral opacities that most likely represent hydrostatic edema in the setting of STEMI, however cannot rule out pneumonia. He was COVID negative. He is on zosyn empirically.  He is making adequate urine (350cc since arrival in ICU).  eICU Interventions  Plan: NEURO: Post-arrest cooling protocol. EEG. Neuro consult in AM. No sedation at this time given no return of mental status post-arrest. CARD: Amiodarone drip. S/p PCI/stent for RCA occlusion / STEMI. ASA + Cangrelor drip for now. Levophed to support MAP target of 65 mmHg. Cardiology following.  PULM: Empiric zosyn for possible aspiration pneumonia. On VC 550x18, PEEP 8, FiO2 1.0 -- we will increase PEEP to bring down FiO2 and improve oxygenation. May benefit from diuresis if UOP begins to trail off. PPX: Heparin [place], Famotidine CODE STATUS: Full. DISPO: ICU.     Intervention Category Evaluation Type: New Patient Evaluation  Charlott Rakes 11/23/2019, 10:11 PM

## 2019-11-23 NOTE — ED Notes (Signed)
PL intubated by Dr. Joan Mayans  7.5 tube 28 @ the teeth Positive color change

## 2019-11-23 NOTE — Progress Notes (Signed)
Peep DOWN TO 5

## 2019-11-23 NOTE — ED Notes (Signed)
md informed of elevated trop

## 2019-11-23 NOTE — ED Notes (Signed)
X-ray at bedside

## 2019-11-23 NOTE — ED Provider Notes (Signed)
[facility] Emergency Department Provider Note  ____________________________________________   First MD Initiated Contact with Patient 11/23/19 1639     (approximate)  I have reviewed the triage vital signs and the nursing notes.   HISTORY  Chief Complaint Cardiac Arrest    HPI Kurt [name] is a 63 y.o. male with COPD status post cardiac arrest.  Patient was reportedly using cocaine last night.  Went outside to have a cigarette and then person heard a thud. EMS was in there within a few minutes and patient was in V. fib arrest.  Downtime was a few minutes.  Patient had 6 episodes of V. fib.  Got 300- of amnio and 150 of amnio and shocked multiple times.  Patient had ROSC.  Upon arrival patient was hypotensive but responded to fluids.  Patient had reactive pupils spontaneously moving his arms but had blood on his face.  Unable to get full HPI due to patient's altered mental status.   Past Medical History:  Diagnosis Date   COPD (chronic obstructive pulmonary disease) ([place])    ED (erectile dysfunction)    Hypertension    Lumbar disc disease     Patient Active Problem List   Diagnosis Date Noted   Acute exacerbation of chronic bronchitis ([place]) 03/16/2018   Acute back pain with sciatica, right 07/26/2017   GERD (gastroesophageal reflux disease) 11/24/2016   Routine general medical examination at a health care facility 06/12/2014   Hypertension    Lumbar back pain with radiculopathy affecting left lower extremity 06/10/2011   COPD with chronic bronchitis ([place]) 09/01/2007   [place] DISEASE, LUMBAR 09/01/2007    Past Surgical History:  Procedure Laterality Date   APPENDECTOMY      Prior to Admission medications   Medication Sig Start Date End Date Taking? Authorizing Provider  aspirin 81 MG tablet Take 81 mg by mouth daily.    [provider]  hydrochlorothiazide (HYDRODIURIL) 12.5 MG tablet Take 1 tablet by mouth once daily  11/20/19   Venia Carbon, MD  ibuprofen (ADVIL,MOTRIN) 600 MG tablet Take 1 tablet (600 mg total) by mouth every 6 (six) hours as needed. 07/28/17   Rudene Re, MD  losartan (COZAAR) 50 MG tablet TAKE 1 TABLET BY MOUTH ONCE DAILY -  NEED  OFFICE  VISIT 11/02/19   Venia Carbon, MD  predniSONE (DELTASONE) 20 MG tablet 3 tabs by mouth daily x 3 days, then 2 tabs by mouth daily x 2 days then 1 tab by mouth daily x 2 days 11/18/18   Jinny Sanders, MD  traMADol (ULTRAM) 50 MG tablet Take 1 tablet (50 mg total) by mouth every 8 (eight) hours as needed. 10/11/19   Venia Carbon, MD    Allergies Patient has no known allergies.  Family History  Problem Relation Age of Onset   Hyperlipidemia Father    Cancer Father    Heart disease Father        heart valve disease (from Agent Orange?)   Alcohol abuse Mother    Hypertension Neg Hx    Diabetes Neg Hx    Colon cancer Neg Hx    Colon polyps Neg Hx    Pancreatic cancer Neg Hx    Rectal cancer Neg Hx    Stomach cancer Neg Hx     Social History Social History   Tobacco Use   Smoking status: Current Every Day Smoker   Smokeless tobacco: Never Used  Substance Use Topics   Alcohol  use: Yes   Drug use: Yes    Types: Marijuana      Review of Systems Unable to get full review of system due to patient's altered mental status.  _________________________   PHYSICAL EXAM:  VITAL SIGNS: ED Triage Vitals  Enc Vitals Group     BP 11/23/19 1626 (!) 136/95     Pulse Rate 11/23/19 1632 95     Resp 11/23/19 1626 (!) 24     Temp --      Temp src --      SpO2 11/23/19 1628 (!) 84 %     Weight 11/23/19 1628 190 lb (86.2 kg)     Height --      Head Circumference --      Peak Flow --      Pain Score --      Pain Loc --      Pain Edu? --      Excl. in [place]? --     Constitutional: Altered, post cardiac arrest Eyes: Conjunctivae are normal.  Equals equal and reactive. Head: Blood coming down his face. Nose:  No congestion/rhinnorhea. Mouth/Throat: Mucous membranes are moist.   Neck: No stridor. Trachea Midline. FROM Cardiovascular: Normal rate, regular rhythm. Grossly normal heart sounds.  Good peripheral circulation. Respiratory: Normal respiratory effort.  No retractions. Lungs CTAB.  Gastrointestinal: Soft and nontender. No distention. No abdominal bruits.  Musculoskeletal: No lower extremity tenderness nor edema.  No joint effusions.  IO on the right leg Neurologic: Reactive pupils.  Spontaneously moved his arms for a few seconds.  Spontaneously breathing on his own. Skin:  Skin is cold on his hands and feet but otherwise warm. Psychiatric: Unable to fully assess due to altered mental status. GU: Deferred   ____________________________________________   LABS (all labs ordered are listed, but only abnormal results are displayed)  Labs Reviewed  CBC WITH DIFFERENTIAL/PLATELET - Abnormal; Notable for the following components:      Result Value   WBC 20.6 (*)    All other components within normal limits  PROTIME-INR - Abnormal; Notable for the following components:   Prothrombin Time 15.8 (*)    INR 1.3 (*)    All other components within normal limits  APTT - Abnormal; Notable for the following components:   aPTT 42 (*)    All other components within normal limits  COMPREHENSIVE METABOLIC PANEL - Abnormal; Notable for the following components:   Potassium 2.8 (*)    Glucose, Bld 332 (*)    Creatinine, Ser 1.39 (*)    Calcium 8.1 (*)    Albumin 3.3 (*)    AST 65 (*)    GFR calc non Af Amer 54 (*)    All other components within normal limits  BLOOD GAS, ARTERIAL - Abnormal; Notable for the following components:   pH, Arterial 7.09 (*)    pCO2 arterial 70 (*)    pO2, Arterial 55 (*)    Acid-base deficit 9.7 (*)    All other components within normal limits  TROPONIN I (HIGH SENSITIVITY) - Abnormal; Notable for the following components:   Troponin I (High Sensitivity) 205 (*)     All other components within normal limits  RESPIRATORY PANEL BY RT PCR (FLU A&B, COVID)  LIPID PANEL  URINE DRUG SCREEN, QUALITATIVE ([facility] ONLY)  URINALYSIS, ROUTINE W REFLEX MICROSCOPIC  MAGNESIUM   ____________________________________________   ED ECG REPORT I, Vanessa , the attending physician, personally viewed and interpreted this ECG.  Initial  EKG was difficult to interpret due to his spontaneous breathing.  Repeat EKG after intubation and paralysis shows inferior ST elevation and a little bit of ST elevation in the anterior leads.  No T wave inversion, sinus.  STEMI ____________________________________________  RADIOLOGY Robert Bellow, personally viewed and evaluated these images (plain radiographs) as part of my medical decision making, as well as reviewing the written report by the radiologist.  ED MD interpretation:  ET tube just above carina so pulled back 1 cm.   Official radiology report(s): DG Chest 1 View  Result Date: 11/23/2019 CLINICAL DATA:  Intubation. Cardiac arrest. EXAM: CHEST  1 VIEW COMPARISON:  03/16/2018 FINDINGS: Endotracheal tube is in good position 3 cm above the carina. Heart size and pulmonary vascularity are normal. Slight haziness in the mid and upper lung zones may represent mild pulmonary edema. No effusions. No pneumothorax. IMPRESSION: 1. Endotracheal tube in good position. 2. Hazy infiltrates in the mid and upper lung zones may represent mild pulmonary edema. Aspiration pneumonitis could also give this appearance. Electronically Signed   By: Lorriane Shire M.D.   On: 11/23/2019 17:24   CT Head Wo Contrast  Result Date: 11/23/2019 CLINICAL DATA:  Head trauma. Cardiac arrest. EXAM: CT HEAD WITHOUT CONTRAST CT CERVICAL SPINE WITHOUT CONTRAST TECHNIQUE: Multidetector CT imaging of the head and cervical spine was performed following the standard protocol without intravenous contrast. Multiplanar CT image reconstructions of the cervical spine  were also generated. COMPARISON:  None. FINDINGS: CT HEAD FINDINGS Brain: No evidence of acute infarction, hemorrhage, hydrocephalus, extra-axial collection or mass lesion/mass effect. Vascular: No hyperdense vessel or unexpected calcification. Skull: Normal. Negative for fracture or focal lesion. Sinuses/Orbits: No acute finding. Evidence of previous maxillary sinus surgery. Slight mucosal thickening in the ethmoid air cells. Other: None CT CERVICAL SPINE FINDINGS Alignment: Normal. Skull base and vertebrae: No acute fracture. No primary bone lesion or focal pathologic process. Soft tissues and spinal canal: No prevertebral fluid or swelling. No visible canal hematoma. Disc levels: There are no disc protrusions or significant disc bulges in the cervical spine. There is no spinal stenosis. There is moderate left foraminal stenosis at C6-7 due to facet arthritis. The neural foramina throughout the remainder of the cervical spine are widely patent. Moderate right facet arthritis at C3-4. Upper chest: Focal area of infiltrate at the right lung apex posteriorly. Patchy haziness in both lung apices. Endotracheal tube in place. Other: None IMPRESSION: 1. Normal CT scan of the head. 2. No significant abnormality of the cervical spine. Moderate left foraminal stenosis at C6-7. Electronically Signed   By: Lorriane Shire M.D.   On: 11/23/2019 17:30   CT Cervical Spine Wo Contrast  Result Date: 11/23/2019 CLINICAL DATA:  Head trauma. Cardiac arrest. EXAM: CT HEAD WITHOUT CONTRAST CT CERVICAL SPINE WITHOUT CONTRAST TECHNIQUE: Multidetector CT imaging of the head and cervical spine was performed following the standard protocol without intravenous contrast. Multiplanar CT image reconstructions of the cervical spine were also generated. COMPARISON:  None. FINDINGS: CT HEAD FINDINGS Brain: No evidence of acute infarction, hemorrhage, hydrocephalus, extra-axial collection or mass lesion/mass effect. Vascular: No hyperdense  vessel or unexpected calcification. Skull: Normal. Negative for fracture or focal lesion. Sinuses/Orbits: No acute finding. Evidence of previous maxillary sinus surgery. Slight mucosal thickening in the ethmoid air cells. Other: None CT CERVICAL SPINE FINDINGS Alignment: Normal. Skull base and vertebrae: No acute fracture. No primary bone lesion or focal pathologic process. Soft tissues and spinal canal: No prevertebral fluid or swelling.  No visible canal hematoma. Disc levels: There are no disc protrusions or significant disc bulges in the cervical spine. There is no spinal stenosis. There is moderate left foraminal stenosis at C6-7 due to facet arthritis. The neural foramina throughout the remainder of the cervical spine are widely patent. Moderate right facet arthritis at C3-4. Upper chest: Focal area of infiltrate at the right lung apex posteriorly. Patchy haziness in both lung apices. Endotracheal tube in place. Other: None IMPRESSION: 1. Normal CT scan of the head. 2. No significant abnormality of the cervical spine. Moderate left foraminal stenosis at C6-7. Electronically Signed   By: Lorriane Shire M.D.   On: 11/23/2019 17:30    ____________________________________________   PROCEDURES  Procedure(s) performed (including Critical Care):  .Critical Care Performed by: Vanessa [place], MD Authorized by: Vanessa [place], MD   Critical care provider statement:    Critical care time (minutes):  60   Critical care was necessary to treat or prevent imminent or life-threatening deterioration of the following conditions:  Respiratory failure and cardiac failure   Critical care was time spent personally by me on the following activities:  Discussions with consultants, evaluation of patient's response to treatment, examination of patient, ordering and performing treatments and interventions, ordering and review of laboratory studies, ordering and review of radiographic studies, pulse oximetry, re-evaluation  of patient's condition, obtaining history from patient or surrogate and review of old charts Procedure Name: Intubation Date/Time: 11/23/2019 5:25 PM Performed by: Vanessa [place], MD Pre-anesthesia Checklist: Patient identified, Patient being monitored, Emergency Drugs available, Timeout performed and Suction available Oxygen Delivery Method: Non-rebreather mask Preoxygenation: Pre-oxygenation with 100% oxygen Induction Type: Rapid sequence Ventilation: Mask ventilation without difficulty Laryngoscope Size: Glidescope and 4 Tube size: 7.5 mm Number of attempts: 1 Airway Equipment and Method: Video-laryngoscopy Placement Confirmation: ETT inserted through vocal cords under direct vision,  CO2 detector and Breath sounds checked- equal and bilateral Tube secured with: ETT holder Difficulty Due To: Difficulty was unanticipated        ____________________________________________   INITIAL IMPRESSION / ASSESSMENT AND PLAN / ED COURSE  Emrey Maniscalco was evaluated in Emergency Department on 11/23/2019 for the symptoms described in the history of present illness. He was evaluated in the context of the global COVID-19 pandemic, which necessitated consideration that the patient might be at risk for infection with the SARS-CoV-2 virus that causes COVID-19. Institutional protocols and algorithms that pertain to the evaluation of patients at risk for COVID-19 are in a state of rapid change based on information released by regulatory bodies including the CDC and federal and state organizations. These policies and algorithms were followed during the patient's care in the ED.    Patient is a 89 post cardiac arrest after using cocaine last night.  Patient does have blood on his face and did hit his head therefore will need a CT scan before we heparinize him.  Patient's EKG does meet STEMI criteria.  It was activated in the field. .  Glucose was normal.  No evidence of other trauma on the rest of his  body.  Patient given a liter of fluids and his blood pressures responded very well.  Patient given spontaneous breathing was intubated with roc and etomidate.  Patient was sedated then with propofol and fentanyl.  Chest x-ray showed the tube just above the carina so tube was moved back.  I discussed with Dr. Clayborn Bigness would from the STEMI line and they will come down to see the patient just  waiting for the CT scan given signs of trauma before we heparinized.  CT head negative.  CT neck negative.  Unable to get Dr. Mortimer Fries x2 but ICU nurse says we do have a bed for the patient after cath so they are okay with patient going.   Cath labs ready for patient and pt went down to cath lab.   ____________________________________________   FINAL CLINICAL IMPRESSION(S) / ED DIAGNOSES   Final diagnoses:  Cardiac arrest ([place])  ST elevation myocardial infarction involving right coronary artery (HCC)  Cocaine use      MEDICATIONS GIVEN DURING THIS VISIT:  Medications  0.9 %  sodium chloride infusion (has no administration in time range)  fentaNYL 10 mcg/ml infusion (has no administration in time range)  propofol (DIPRIVAN) 1000 MG/100ML infusion (has no administration in time range)  norepinephrine (LEVOPHED) 4-5 MG/250ML-% infusion SOLN (has no administration in time range)  potassium chloride 10 mEq in 100 mL IVPB (has no administration in time range)  sodium chloride flush (NS) 0.9 % injection 3 mL (has no administration in time range)  potassium chloride 10 mEq in 100 mL IVPB (has no administration in time range)     ED Discharge Orders    None       Note:  This document was prepared using Dragon voice recognition software and may include unintentional dictation errors.   Vanessa [place], MD 11/23/19 [phone]

## 2019-11-23 NOTE — Consult Note (Signed)
CARDIOLOGY CONSULT NOTE               Patient ID: Kurt [name] MRN: [id number] DOB/AGE: Nov 30, 1956 63 y.o.  Admit date: 11/23/2019 Referring Physician Dr. Nickolas [place] ER Primary Physician Viviana Simpler primary Primary Cardiologist none Reason for Consultation status post cardiac arrest possible STEMI  HPI: Patient reportedly had been using cocaine earlier or yesterday went outside to smoke a cigarette and reportedly arrested and was found on the floor he suffered injury to his face EMS was called his initial rhythm was V. fib he was shocked producing a wide-complex rhythm.  Code STEMI was initiated patient was brought to the emergency room after quick evaluation by the ER the elected to intubate and sedate him to protect his airway he also went to CT to evaluate for any head injury and to rule out CVA patient was then released to have cardiac cath for evaluation of possible STEMI.  No other history was obtained no history of previous cardiac problems he was found to have hypokalemia on EKG.  Patient appears to be moving all extremities but is currently intubated sedated.  Initial high-sensitivity troponin was 230 but this was post defibrillation.  Patient reportedly received amiodarone in the field 300 then 150.  Unclear if he received aspirin.  Review of systems complete and found to be negative unless listed above     Past Medical History:  Diagnosis Date  . COPD (chronic obstructive pulmonary disease) ([place])   . ED (erectile dysfunction)   . Hypertension   . Lumbar disc disease     Past Surgical History:  Procedure Laterality Date  . APPENDECTOMY      (Not in a hospital admission)  Social History   Socioeconomic History  . Marital status: Married    Spouse name: Not on file  . Number of children: 1  . Years of education: Not on file  . Highest education level: Not on file  Occupational History  . 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 Smoking status: Current Every Day Smoker  . Smokeless tobacco: Never Used  Substance and Sexual Activity  . Alcohol use: Yes  . Drug use: Yes    Types: Marijuana  . Sexual activity: Not on file  Other Topics Concern  . Not on file  Social History Narrative  . Not on file   Social Determinants of Health   Financial Resource Strain:   . Difficulty of Paying Living Expenses: Not on file  Food Insecurity:   . Worried About Charity fundraiser in the Last Year: Not on file  . Ran Out of Food in the Last Year: Not on file  Transportation Needs:   . Lack of Transportation (Medical): Not on file  . Lack of Transportation (Non-Medical): Not on file  Physical Activity:   . Days of Exercise per Week: Not on file  . Minutes of Exercise per Session: Not on file  Stress:   . Feeling of Stress : Not on file  Social Connections:   . Frequency of Communication with Friends and Family: Not on file  . Frequency of Social Gatherings with Friends and Family: Not on file  . Attends Religious Services: Not on file  . Active Member of Clubs or Organizations: Not on file  . Attends Archivist Meetings: Not on file  . Marital Status: Not on file  Intimate Partner Violence:   . Fear of Current or Ex-Partner: Not on file  .  Emotionally Abused: Not on file  . Physically Abused: Not on file  . Sexually Abused: Not on file    Family History  Problem Relation Age of Onset  . Hyperlipidemia Father   . Cancer Father   . Heart disease Father        heart valve disease (from Agent Orange?)  . Alcohol abuse Mother   . Hypertension Neg Hx   . Diabetes Neg Hx   . Colon cancer Neg Hx   . Colon polyps Neg Hx   . Pancreatic cancer Neg Hx   . Rectal cancer Neg Hx   . Stomach cancer Neg Hx       Review of systems complete and found to be negative unless listed above      PHYSICAL EXAM  General: Well developed, well nourished, in no acute distress HEENT:  Normocephalic and  atramatic Neck:  No JVD.  Lungs: Clear bilaterally to auscultation and percussion. Heart: HRRR . Normal S1 and S2 without gallops or murmurs.  Abdomen: Bowel sounds are positive, abdomen soft and non-tender  Msk:  Back normal, normal gait. Normal strength and tone for age. Extremities: No clubbing, cyanosis or edema.   Neuro: Alert and oriented X 3. Psych:  Good affect, responds appropriately  Labs:   Lab Results  Component Value Date   WBC 20.6 (H) 11/23/2019   HGB 16.8 11/23/2019   HCT 50.1 11/23/2019   MCV 100.0 11/23/2019   PLT 195 11/23/2019    Recent Labs  Lab 11/23/19 1627  NA 141  K 2.8*  CL 105  CO2 23  BUN 23  CREATININE 1.39*  CALCIUM 8.1*  PROT 6.6  BILITOT 0.7  ALKPHOS 95  ALT 42  AST 65*  GLUCOSE 332*   Lab Results  Component Value Date   TROPONINI < 0.02 02/26/2013    Lab Results  Component Value Date   CHOL 157 11/23/2019   CHOL 159 06/12/2014   Lab Results  Component Value Date   HDL 43 11/23/2019   HDL 34.50 (L) 06/12/2014   Lab Results  Component Value Date   LDLCALC 92 11/23/2019   LDLCALC 82 06/12/2014   Lab Results  Component Value Date   TRIG 110 11/23/2019   TRIG 213.0 (H) 06/12/2014   Lab Results  Component Value Date   CHOLHDL 3.7 11/23/2019   CHOLHDL 5 06/12/2014   No results found for: LDLDIRECT    Radiology: DG Chest 1 View  Result Date: 11/23/2019 CLINICAL DATA:  Intubation. Cardiac arrest. EXAM: CHEST  1 VIEW COMPARISON:  03/16/2018 FINDINGS: Endotracheal tube is in good position 3 cm above the carina. Heart size and pulmonary vascularity are normal. Slight haziness in the mid and upper lung zones may represent mild pulmonary edema. No effusions. No pneumothorax. IMPRESSION: 1. Endotracheal tube in good position. 2. Hazy infiltrates in the mid and upper lung zones may represent mild pulmonary edema. Aspiration pneumonitis could also give this appearance. Electronically Signed   By: Lorriane Shire M.D.   On:  11/23/2019 17:24    EKG: Borderline EKG with subtle ST elevation inferiorly minimal reciprocal changes inferiorly appears to be sinus rhythm  EKG in the field post defibrillation has wide-complex rhythm  ASSESSMENT AND PLAN:  Post cardiac arrest V. fib initial rhythm Possible STEMI Intubated sedated Substance abuse Hypokalemia Status post fall minor head trauma . Plan Continue respiratory support on the vent Continue sedation Recommend cardiac cath post CT Abnormal EKG Correct electrolytes Transfer to ICU after  cardiac cath  Signed: Yolonda Kida MD, 11/23/2019, 5:28 PM

## 2019-11-23 NOTE — ED Notes (Signed)
propfol started at 74mcg/minute

## 2019-11-23 NOTE — ED Notes (Signed)
Pt arrived at CT accompanied by RNs

## 2019-11-24 ENCOUNTER — Inpatient Hospital Stay
Admit: 2019-11-24 | Discharge: 2019-11-24 | Disposition: A | Discharge status: Home / Self Care | Payer: 59 | Attending: Pulmonary Disease | Admitting: Pulmonary Disease

## 2019-11-24 LAB — BLOOD GAS, ARTERIAL
Acid-base deficit: 1.4 mmol/L (ref 0.0–2.0)
Bicarbonate: 26.3 mmol/L (ref 20.0–28.0)
FIO2: 100
MECHVT: 550 mL
O2 Saturation: 99.3 %
PEEP: 12 cmH2O
Patient temperature: 36
RATE: 26 resp/min
pCO2 arterial: 54 mmHg — ABNORMAL HIGH (ref 32.0–48.0)
pH, Arterial: 7.29 — ABNORMAL LOW (ref 7.350–7.450)
pO2, Arterial: 156 mmHg — ABNORMAL HIGH (ref 83.0–108.0)

## 2019-11-24 LAB — BASIC METABOLIC PANEL
Anion gap: 6 (ref 5–15)
Anion gap: 9 (ref 5–15)
BUN: 27 mg/dL — ABNORMAL HIGH (ref 8–23)
BUN: 34 mg/dL — ABNORMAL HIGH (ref 8–23)
CO2: 23 mmol/L (ref 22–32)
CO2: 24 mmol/L (ref 22–32)
Calcium: 6.7 mg/dL — ABNORMAL LOW (ref 8.9–10.3)
Calcium: 6.9 mg/dL — ABNORMAL LOW (ref 8.9–10.3)
Chloride: 112 mmol/L — ABNORMAL HIGH (ref 98–111)
Chloride: 110 mmol/L (ref 98–111)
Creatinine, Ser: 1.46 mg/dL — ABNORMAL HIGH (ref 0.61–1.24)
Creatinine, Ser: 1.44 mg/dL — ABNORMAL HIGH (ref 0.61–1.24)
GFR calc Af Amer: 59 mL/min — ABNORMAL LOW (ref >60)
GFR calc Af Amer: >60 mL/min (ref >60)
GFR calc non Af Amer: 51 mL/min — ABNORMAL LOW (ref >60)
GFR calc non Af Amer: 52 mL/min — ABNORMAL LOW (ref >60)
Glucose, Bld: 164 mg/dL — ABNORMAL HIGH (ref 70–99)
Glucose, Bld: 140 mg/dL — ABNORMAL HIGH (ref 70–99)
Potassium: 5.9 mmol/L — ABNORMAL HIGH (ref 3.5–5.1)
Potassium: 4.2 mmol/L (ref 3.5–5.1)
Sodium: 141 mmol/L (ref 135–145)
Sodium: 143 mmol/L (ref 135–145)

## 2019-11-24 LAB — CBC
HCT: 47.4 % (ref 39.0–52.0)
Hemoglobin: 15.6 g/dL (ref 13.0–17.0)
MCH: 33.4 pg (ref 26.0–34.0)
MCHC: 32.9 g/dL (ref 30.0–36.0)
MCV: 101.5 fL — ABNORMAL HIGH (ref 80.0–100.0)
Platelets: 207 10*3/uL (ref 150–400)
RBC: 4.67 MIL/uL (ref 4.22–5.81)
RDW: 13.4 % (ref 11.5–15.5)
WBC: 28 10*3/uL — ABNORMAL HIGH (ref 4.0–10.5)
nRBC: 0 % (ref 0.0–0.2)

## 2019-11-24 LAB — COMPREHENSIVE METABOLIC PANEL
ALT: 56 U/L — ABNORMAL HIGH (ref 0–44)
AST: 143 U/L — ABNORMAL HIGH (ref 15–41)
Albumin: 2.7 g/dL — ABNORMAL LOW (ref 3.5–5.0)
Alkaline Phosphatase: 53 U/L (ref 38–126)
Anion gap: 9 (ref 5–15)
BUN: 28 mg/dL — ABNORMAL HIGH (ref 8–23)
CO2: 20 mmol/L — ABNORMAL LOW (ref 22–32)
Calcium: 6.1 mg/dL — CL (ref 8.9–10.3)
Chloride: 113 mmol/L — ABNORMAL HIGH (ref 98–111)
Creatinine, Ser: 1.4 mg/dL — ABNORMAL HIGH (ref 0.61–1.24)
GFR calc Af Amer: >60 mL/min (ref >60)
GFR calc non Af Amer: 54 mL/min — ABNORMAL LOW (ref >60)
Glucose, Bld: 207 mg/dL — ABNORMAL HIGH (ref 70–99)
Potassium: 5.8 mmol/L — ABNORMAL HIGH (ref 3.5–5.1)
Sodium: 142 mmol/L (ref 135–145)
Total Bilirubin: 0.7 mg/dL (ref 0.3–1.2)
Total Protein: 5.3 g/dL — ABNORMAL LOW (ref 6.5–8.1)

## 2019-11-24 LAB — TROPONIN I (HIGH SENSITIVITY)
Troponin I (High Sensitivity): >27000 ng/L (ref <18)
Troponin I (High Sensitivity): >27000 ng/L (ref <18)
Troponin I (High Sensitivity): >27000 ng/L (ref <18)

## 2019-11-24 LAB — GLUCOSE, CAPILLARY
Glucose-Capillary: 116 mg/dL — ABNORMAL HIGH (ref 70–99)
Glucose-Capillary: 122 mg/dL — ABNORMAL HIGH (ref 70–99)
Glucose-Capillary: 137 mg/dL — ABNORMAL HIGH (ref 70–99)
Glucose-Capillary: 73 mg/dL (ref 70–99)
Glucose-Capillary: 78 mg/dL (ref 70–99)
Glucose-Capillary: 79 mg/dL (ref 70–99)
Glucose-Capillary: 81 mg/dL (ref 70–99)

## 2019-11-24 LAB — LACTIC ACID, PLASMA: Lactic Acid, Venous: 2.8 mmol/L (ref 0.5–1.9)

## 2019-11-24 LAB — ECHOCARDIOGRAM COMPLETE
Height: 69 in
Weight: 3142.88 oz

## 2019-11-24 LAB — HEMOGLOBIN A1C
Hgb A1c MFr Bld: 4.8 % (ref 4.8–5.6)
Mean Plasma Glucose: 91.06 mg/dL

## 2019-11-24 LAB — HIV ANTIBODY (ROUTINE TESTING W REFLEX): HIV Screen 4th Generation wRfx: NONREACTIVE

## 2019-11-24 LAB — PROCALCITONIN
Procalcitonin: 2.38 ng/mL
Procalcitonin: 3.63 ng/mL

## 2019-11-24 MED ORDER — SODIUM ZIRCONIUM CYCLOSILICATE 5 G PO PACK
5.0000 g | PACK | Freq: Two times a day (BID) | ORAL | Status: AC
  Administered 2019-11-24: 04:00:00 5 g via ORAL
  Filled 2019-11-24: qty 1

## 2019-11-24 MED ORDER — SODIUM CHLORIDE 0.9 % WEIGHT BASED INFUSION: 3.0000 mL/kg/h | INTRAVENOUS | Status: DC

## 2019-11-24 MED ORDER — HYDRALAZINE HCL 20 MG/ML IJ SOLN: 10.0000 mg | INTRAMUSCULAR | Status: AC | PRN

## 2019-11-24 MED ORDER — SODIUM CHLORIDE 0.9 % IV SOLN: 250.0000 mL | INTRAVENOUS | Status: DC | PRN

## 2019-11-24 MED ORDER — SODIUM CHLORIDE 0.9% FLUSH
3.0000 mL | INTRAVENOUS | Status: DC | PRN
  Administered 2019-11-30: 3 mL via INTRAVENOUS

## 2019-11-24 MED ORDER — TICAGRELOR 90 MG PO TABS
90.0000 mg | ORAL_TABLET | Freq: Two times a day (BID) | ORAL | Status: DC
  Administered 2019-11-24 – 2019-12-01 (×15): 90 mg
  Filled 2019-11-24 (×16): qty 1

## 2019-11-24 MED ORDER — SODIUM BICARBONATE 8.4 % IV SOLN
50.0000 meq | Freq: Once | INTRAVENOUS | Status: AC
  Administered 2019-11-24: 04:00:00 50 meq via INTRAVENOUS
  Filled 2019-11-24: qty 50

## 2019-11-24 MED ORDER — CHLORHEXIDINE GLUCONATE CLOTH 2 % EX PADS
6.0000 | MEDICATED_PAD | Freq: Every day | CUTANEOUS | Status: DC
  Administered 2019-11-24 – 2019-11-30 (×7): 6 via TOPICAL

## 2019-11-24 MED ORDER — CHLORHEXIDINE GLUCONATE 0.12% ORAL RINSE (MEDLINE KIT)
15.0000 mL | Freq: Two times a day (BID) | OROMUCOSAL | Status: DC
  Administered 2019-11-24 – 2019-12-08 (×25): 15 mL via OROMUCOSAL

## 2019-11-24 MED ORDER — ONDANSETRON HCL 4 MG/2ML IJ SOLN: 4.0000 mg | Freq: Four times a day (QID) | INTRAMUSCULAR | Status: DC | PRN

## 2019-11-24 MED ORDER — ASPIRIN 81 MG PO CHEW: 81.0000 mg | CHEWABLE_TABLET | Freq: Every day | ORAL | Status: DC

## 2019-11-24 MED ORDER — ASPIRIN 325 MG PO TABS: 325.0000 mg | ORAL_TABLET | Freq: Every day | ORAL | Status: DC

## 2019-11-24 MED ORDER — SODIUM CHLORIDE 0.9 % WEIGHT BASED INFUSION
1.0000 mL/kg/h | INTRAVENOUS | Status: DC
  Administered 2019-11-25: 1 mL/kg/h via INTRAVENOUS

## 2019-11-24 MED ORDER — LACTATED RINGERS IV BOLUS
1000.0000 mL | Freq: Once | INTRAVENOUS | Status: AC
  Administered 2019-11-24: 1000 mL via INTRAVENOUS

## 2019-11-24 MED ORDER — DEXTROSE 50 % IV SOLN
1.0000 | Freq: Once | INTRAVENOUS | Status: AC
  Administered 2019-11-24: 50 mL via INTRAVENOUS
  Filled 2019-11-24: qty 50

## 2019-11-24 MED ORDER — ORAL CARE MOUTH RINSE
15.0000 mL | OROMUCOSAL | Status: DC
  Administered 2019-11-24 – 2019-12-01 (×68): 15 mL via OROMUCOSAL

## 2019-11-24 MED ORDER — SODIUM CHLORIDE 0.9% FLUSH: 3.0000 mL | INTRAVENOUS | Status: DC | PRN

## 2019-11-24 MED ORDER — IPRATROPIUM-ALBUTEROL 0.5-2.5 (3) MG/3ML IN SOLN
3.0000 mL | RESPIRATORY_TRACT | Status: DC
  Administered 2019-11-24 – 2019-12-04 (×60): 3 mL via RESPIRATORY_TRACT
  Filled 2019-11-24 (×62): qty 3

## 2019-11-24 MED ORDER — LABETALOL HCL 5 MG/ML IV SOLN: 10.0000 mg | INTRAVENOUS | Status: AC | PRN

## 2019-11-24 MED ORDER — SODIUM CHLORIDE 0.9 % WEIGHT BASED INFUSION
1.0000 mL/kg/h | INTRAVENOUS | Status: AC
  Administered 2019-11-24: 11:00:00 1 mL/kg/h via INTRAVENOUS

## 2019-11-24 MED ORDER — ALBUTEROL SULFATE (2.5 MG/3ML) 0.083% IN NEBU: 2.5000 mg | INHALATION_SOLUTION | RESPIRATORY_TRACT | Status: DC | PRN

## 2019-11-24 MED ORDER — PERFLUTREN LIPID MICROSPHERE
1.0000 mL | INTRAVENOUS | Status: AC | PRN
  Administered 2019-11-24: 3 mL via INTRAVENOUS
  Filled 2019-11-24: qty 10

## 2019-11-24 MED ORDER — ACETAMINOPHEN 325 MG PO TABS
650.0000 mg | ORAL_TABLET | ORAL | Status: DC | PRN
  Administered 2019-12-03: 650 mg via ORAL
  Filled 2019-11-24: qty 2

## 2019-11-24 MED ORDER — CLOPIDOGREL BISULFATE 75 MG PO TABS: 300.0000 mg | ORAL_TABLET | Freq: Every day | ORAL | Status: DC

## 2019-11-24 MED ORDER — TICAGRELOR 90 MG PO TABS: 90.0000 mg | ORAL_TABLET | Freq: Two times a day (BID) | ORAL | Status: DC

## 2019-11-24 MED ORDER — ASPIRIN 81 MG PO CHEW: 81.0000 mg | CHEWABLE_TABLET | ORAL | Status: AC

## 2019-11-24 MED ORDER — INSULIN REGULAR HUMAN 100 UNIT/ML IJ SOLN
10.0000 [IU] | Freq: Once | INTRAMUSCULAR | Status: AC
  Administered 2019-11-24: 10 [IU] via INTRAVENOUS
  Filled 2019-11-24: qty 3

## 2019-11-24 MED ORDER — SODIUM CHLORIDE 0.9% FLUSH
3.0000 mL | Freq: Two times a day (BID) | INTRAVENOUS | Status: DC
  Administered 2019-11-24 – 2019-11-25 (×3): 3 mL via INTRAVENOUS

## 2019-11-24 MED ORDER — ASPIRIN 81 MG PO CHEW
81.0000 mg | CHEWABLE_TABLET | Freq: Every day | ORAL | Status: DC
  Administered 2019-11-24 – 2019-12-01 (×8): 81 mg
  Filled 2019-11-24 (×8): qty 1

## 2019-11-24 NOTE — Progress Notes (Signed)
Assisted tele visit to patient with son.  Tanikka Bresnan P, RN  

## 2019-11-24 NOTE — Progress Notes (Signed)
ABG results called to Dr. Mortimer Fries - 7.29/54/156/26.3. new order to decrease RR 22 and Titrate FiO2 based on Sat and ETCO2.

## 2019-11-24 NOTE — Progress Notes (Signed)
CRITICAL CARE NOTE    CC  follow up respiratory failure  SUBJECTIVE Patient remains critically ill Prognosis is guarded Multiorgan failure Multiple vasopressors High risk for death  BP 108/77 (BP Location: Right Arm)   Pulse (!) 57   Temp (!) 96.3 F (35.7 C) (Bladder)   Resp (!) 26   Ht '5\' 9"'  (1.753 m)   Wt 89.1 kg   SpO2 99%   BMI 29.01 kg/m    I/O last 3 completed shifts: In: 2313.6 [I.V.:982.3; IV Piggyback:1331.2] Out: 850 [Urine:700; Emesis/NG output:150] No intake/output data recorded.  SpO2: 99 % FiO2 (%): 100 %  SIGNIFICANT EVENTS 12/24 - intubated/Vent support, cardiac arrest, s/p CATH-100% OCCLUSION OF RCA, COCAINE TOXICITY 12/24 - HYPOTHERMIA PROTOCOL 12/24 - Critically ill, requiring 3 vasopressors, 100% FiO2 & 11 PEEP 12/25 remains on pressors, on vent, fio2 100%   REVIEW OF SYSTEMS  PATIENT IS UNABLE TO PROVIDE COMPLETE REVIEW OF SYSTEMS DUE TO SEVERE CRITICAL ILLNESS   PHYSICAL EXAMINATION:  GENERAL:critically ill appearing, +resp distress HEAD: Normocephalic, atraumatic.  EYES: Pupils equal, round, reactive to light.  No scleral icterus.  MOUTH: Moist mucosal membrane. NECK: Supple.  PULMONARY: +rhonchi, +wheezing CARDIOVASCULAR: S1 and S2. Regular rate and rhythm. No murmurs, rubs, or gallops.  GASTROINTESTINAL: Soft, nontender, -distended. No masses. Positive bowel sounds. No hepatosplenomegaly.  MUSCULOSKELETAL: No swelling, clubbing, or edema.  NEUROLOGIC: obtunded, GCS<8 SKIN:intact,warm,dry  MEDICATIONS: I have reviewed all medications and confirmed regimen as documented   CULTURE RESULTS   Recent Results (from the past 240 hour(s))  Respiratory Panel by RT PCR (Flu A&B, Covid) - Nasopharyngeal Swab     Status: None   Collection Time: 11/23/19  4:24 PM   Specimen: Nasopharyngeal Swab  Result Value Ref Range Status   SARS Coronavirus 2 by RT PCR NEGATIVE NEGATIVE Final    Comment: (NOTE) SARS-CoV-2 target nucleic acids are  NOT DETECTED. The SARS-CoV-2 RNA is generally detectable in upper respiratoy specimens during the acute phase of infection. The lowest concentration of SARS-CoV-2 viral copies this assay can detect is 131 copies/mL. A negative result does not preclude SARS-Cov-2 infection and should not be used as the sole basis for treatment or other patient management decisions. A negative result may occur with  improper specimen collection/handling, submission of specimen other than nasopharyngeal swab, presence of viral mutation(s) within the areas targeted by this assay, and inadequate number of viral copies (<131 copies/mL). A negative result must be combined with clinical observations, patient history, and epidemiological information. The expected result is Negative. Fact Sheet for Patients:  PinkCheek.be Fact Sheet for Healthcare Providers:  GravelBags.it This test is not yet ap proved or cleared by the [place] FDA and  has been authorized for detection and/or diagnosis of SARS-CoV-2 by FDA under an Emergency Use Authorization (EUA). This EUA will remain  in effect (meaning this test can be used) for the duration of the COVID-19 declaration under Section 564(b)(1) of the Act, 21 U.S.C. section 360bbb-3(b)(1), unless the authorization is terminated or revoked sooner.    Influenza A by PCR NEGATIVE NEGATIVE Final   Influenza B by PCR NEGATIVE NEGATIVE Final    Comment: (NOTE) The Xpert Xpress SARS-CoV-2/FLU/RSV assay is intended as an aid in  the diagnosis of influenza from Nasopharyngeal swab specimens and  should not be used as a sole basis for treatment. Nasal washings and  aspirates are unacceptable for Xpert Xpress SARS-CoV-2/FLU/RSV  testing. Fact Sheet for Patients: PinkCheek.be Fact Sheet for Healthcare Providers: GravelBags.it This test  is not yet approved or  cleared by the [place] and  has been authorized for detection and/or diagnosis of SARS-CoV-2 by  FDA under an Emergency Use Authorization (EUA). This EUA will remain  in effect (meaning this test can be used) for the duration of the  Covid-19 declaration under Section 564(b)(1) of the Act, 21  U.S.C. section 360bbb-3(b)(1), unless the authorization is  terminated or revoked. Performed at [facility], [address] [place], [place] [address]   MRSA PCR Screening     Status: None   Collection Time: 11/23/19  9:44 PM   Specimen: Nasopharyngeal  Result Value Ref Range Status   MRSA by PCR NEGATIVE NEGATIVE Final    Comment:        The GeneXpert MRSA Assay (FDA approved for NASAL specimens only), is one component of a comprehensive MRSA colonization surveillance program. It is not intended to diagnose MRSA infection nor to guide or monitor treatment for MRSA infections. Performed at [facility] [address] [place], [place] [address]   CULTURE, BLOOD (ROUTINE X 2) w Reflex to ID Panel     Status: None (Preliminary result)   Collection Time: 11/24/19  4:03 AM   Specimen: BLOOD  Result Value Ref Range Status   Specimen Description BLOOD LEFT UPPER ARM  Final   Special Requests   Final    BOTTLES DRAWN AEROBIC AND ANAEROBIC Blood Culture adequate volume   Culture   Final    NO GROWTH <12 HOURS Performed at [facility], [address] [place], [place] [address]    Report Status PENDING  Incomplete  CULTURE, BLOOD (ROUTINE X 2) w Reflex to ID Panel     Status: None (Preliminary result)   Collection Time: 11/24/19  4:03 AM   Specimen: BLOOD  Result Value Ref Range Status   Specimen Description BLOOD BLOOD LEFT FOREARM  Final   Special Requests   Final    BOTTLES DRAWN AEROBIC ONLY Blood Culture results may not be optimal due to an inadequate volume of blood received in culture bottles   Culture   Final    NO GROWTH <12  HOURS Performed at [facility], [address] [place], [place] [address]    Report Status PENDING  Incomplete          IMAGING    DG Chest 1 View  Result Date: 11/23/2019 CLINICAL DATA:  Intubation. Cardiac arrest. EXAM: CHEST  1 VIEW COMPARISON:  03/16/2018 FINDINGS: Endotracheal tube is in good position 3 cm above the carina. Heart size and pulmonary vascularity are normal. Slight haziness in the mid and upper lung zones may represent mild pulmonary edema. No effusions. No pneumothorax. IMPRESSION: 1. Endotracheal tube in good position. 2. Hazy infiltrates in the mid and upper lung zones may represent mild pulmonary edema. Aspiration pneumonitis could also give this appearance. Electronically Signed   By: Lorriane Shire M.D.   On: 11/23/2019 17:24   DG Abd 1 View  Result Date: 11/23/2019 CLINICAL DATA:  Central line and OG placement EXAM: ABDOMEN - 1 VIEW COMPARISON:  Same day chest radiograph FINDINGS: Pacer pads overlie the lower chest. Telemetry leads in monitoring devices also project over the upper abdomen and lower chest. The tip of a transesophageal tube terminates near the level of the gastric antrum with the side port beyond the GE junction. Hazy opacities in the lungs are better seen on dedicated chest radiograph performed concurrently. Coarse radiodensities projecting over both  renal shadows worrisome for urolithiasis if this patient has not recently received contrast media. Degenerative changes present the spine. No high-grade obstructive bowel gas pattern. IMPRESSION: 1. Transesophageal tube tip is near the level of the gastric antrum with the side port beyond the GE junction. 2. No high-grade obstructive bowel gas pattern. 3. Coarse density over both renal shadows could reflect urolithiasis if this patient has not recently received contrast media. Electronically Signed   By: Lovena Le M.D.   On: 11/23/2019 21:51   CT Head Wo Contrast  Result Date:  11/23/2019 CLINICAL DATA:  Head trauma. Cardiac arrest. EXAM: CT HEAD WITHOUT CONTRAST CT CERVICAL SPINE WITHOUT CONTRAST TECHNIQUE: Multidetector CT imaging of the head and cervical spine was performed following the standard protocol without intravenous contrast. Multiplanar CT image reconstructions of the cervical spine were also generated. COMPARISON:  None. FINDINGS: CT HEAD FINDINGS Brain: No evidence of acute infarction, hemorrhage, hydrocephalus, extra-axial collection or mass lesion/mass effect. Vascular: No hyperdense vessel or unexpected calcification. Skull: Normal. Negative for fracture or focal lesion. Sinuses/Orbits: No acute finding. Evidence of previous maxillary sinus surgery. Slight mucosal thickening in the ethmoid air cells. Other: None CT CERVICAL SPINE FINDINGS Alignment: Normal. Skull base and vertebrae: No acute fracture. No primary bone lesion or focal pathologic process. Soft tissues and spinal canal: No prevertebral fluid or swelling. No visible canal hematoma. Disc levels: There are no disc protrusions or significant disc bulges in the cervical spine. There is no spinal stenosis. There is moderate left foraminal stenosis at C6-7 due to facet arthritis. The neural foramina throughout the remainder of the cervical spine are widely patent. Moderate right facet arthritis at C3-4. Upper chest: Focal area of infiltrate at the right lung apex posteriorly. Patchy haziness in both lung apices. Endotracheal tube in place. Other: None IMPRESSION: 1. Normal CT scan of the head. 2. No significant abnormality of the cervical spine. Moderate left foraminal stenosis at C6-7. Electronically Signed   By: Lorriane Shire M.D.   On: 11/23/2019 17:30   CT Cervical Spine Wo Contrast  Result Date: 11/23/2019 CLINICAL DATA:  Head trauma. Cardiac arrest. EXAM: CT HEAD WITHOUT CONTRAST CT CERVICAL SPINE WITHOUT CONTRAST TECHNIQUE: Multidetector CT imaging of the head and cervical spine was performed following  the standard protocol without intravenous contrast. Multiplanar CT image reconstructions of the cervical spine were also generated. COMPARISON:  None. FINDINGS: CT HEAD FINDINGS Brain: No evidence of acute infarction, hemorrhage, hydrocephalus, extra-axial collection or mass lesion/mass effect. Vascular: No hyperdense vessel or unexpected calcification. Skull: Normal. Negative for fracture or focal lesion. Sinuses/Orbits: No acute finding. Evidence of previous maxillary sinus surgery. Slight mucosal thickening in the ethmoid air cells. Other: None CT CERVICAL SPINE FINDINGS Alignment: Normal. Skull base and vertebrae: No acute fracture. No primary bone lesion or focal pathologic process. Soft tissues and spinal canal: No prevertebral fluid or swelling. No visible canal hematoma. Disc levels: There are no disc protrusions or significant disc bulges in the cervical spine. There is no spinal stenosis. There is moderate left foraminal stenosis at C6-7 due to facet arthritis. The neural foramina throughout the remainder of the cervical spine are widely patent. Moderate right facet arthritis at C3-4. Upper chest: Focal area of infiltrate at the right lung apex posteriorly. Patchy haziness in both lung apices. Endotracheal tube in place. Other: None IMPRESSION: 1. Normal CT scan of the head. 2. No significant abnormality of the cervical spine. Moderate left foraminal stenosis at C6-7. Electronically Signed   By: Jeneen Rinks  Maxwell M.D.   On: 11/23/2019 17:30   CARDIAC CATHETERIZATION  Result Date: 11/23/2019  Prox LAD lesion is 50% stenosed.  Prox RCA lesion is 100% stenosed. Infarct-related artery TIMI 0 flow  Dist Cx lesion is 90% stenosed.  Normal left ventricular function ejection fraction between 50 and 55%  Conclusion STEMI presentation 100% proximal RCA TIMI 0 flow Successful PCI and stent with DES 3.0 x 26 mm resolute Onyx to 13 atm to proximal RCA restoring TIMI-3 flow Left main free of disease LAD large with  a proximal 50% lesion Circumflex very large with a distal 90% distal circumflex lesion Mixed dominant system Preserved left ventricular function of 50 to 55% Circumflex lesion intervention was deferred   DG Chest Port 1 View  Result Date: 11/23/2019 CLINICAL DATA:  Central line placement and OG placement EXAM: PORTABLE CHEST 1 VIEW COMPARISON:  Radiograph 11/23/2019 FINDINGS: Endotracheal tube terminates in the mid trachea, 3 cm from the carina. A transesophageal tube tip and side port distal to the GE junction, terminating about the level of the gastric antrum. A right IJ catheter tip is present in the mid to upper SVC. Overlying pacer pads are noted. Telemetry wires project over lower chest as well. Hazy opacities in the mid to upper lungs are similar to prior. No pneumothorax. No effusion. The cardiomediastinal contours are unremarkable. Coarse calcifications noted upper pole left kidney. IMPRESSION: 1. Endotracheal tube 3 cm from the carina. 2. A transesophageal tube tip and side port are distal to the GE junction. 3. Right IJ catheter tip in the mid to upper SVC. 4. Stable hazy opacities in the mid to upper lungs bilaterally. 5. Left upper pole renal calcification. Electronically Signed   By: Lovena Le M.D.   On: 11/23/2019 21:48       Indwelling Urinary Catheter continued, requirement due to   Reason to continue Indwelling Urinary Catheter strict Intake/Output monitoring for hemodynamic instability   Central Line/ continued, requirement due to  Reason to continue Surrency of central venous pressure or other hemodynamic parameters and poor IV access   Ventilator continued, requirement due to severe respiratory failure   Ventilator Sedation RASS 0 to -2      ASSESSMENT AND PLAN SYNOPSIS  64 yo white male with acute and severe hypoxic respiratory failure from acute V fib cardiac arrest and sudden cardiac death STEMI from acute RCA OCCLUSION and from COCAINE Toxicity.   Now with Cardiogenic shock requiring multiple vasopressors.  Concern for possible aspiration pneumonia.      Severe ACUTE Hypoxic and Hypercapnic Respiratory Failure -continue Full MV support -continue Bronchodilator Therapy -Wean Fio2 and PEEP as tolerated   ACUTE MI/cardiac arrest -follow up cardiac enzymes as indicated -follow up cardiology recs CONTINUE HYPOTHERMIA PROTOCOL    ACUTE KIDNEY INJURY/Renal Failure -follow chem 7 -follow UO -continue Foley Catheter-assess need -Avoid nephrotoxic agents -Recheck creatinine    NEUROLOGY - intubated and sedated - minimal sedation to achieve a RASS goal: -1 CONTINUE HYPOTHERMIA PROTOCOL  SHOCK-CARDIOGENIC -use vasopressors to keep MAP>65 -follow ABG and LA -follow up cultures -emperic ABX   CARDIAC ICU monitoring  ID -continue IV abx as prescibed -follow up cultures  GI GI PROPHYLAXIS as indicated  NUTRITIONAL STATUS DIET-->NPO Constipation protocol as indicated  ENDO - will use ICU hypoglycemic\Hyperglycemia protocol if indicated   ELECTROLYTES -follow labs as needed -replace as needed -pharmacy consultation and following   DVT/GI PRX ordered TRANSFUSIONS AS NEEDED MONITOR FSBS ASSESS the need for LABS as needed  Critical Care Time devoted to patient care services described in this note is 32 minutes.   Overall, patient is critically ill, prognosis is guarded.  Patient with Multiorgan failure and at high risk for cardiac arrest and death.    Corrin Parker, M.D.  [facility]  Medical Director [place] Director [facility] Cardio-Pulmonary Department

## 2019-11-24 NOTE — Consult Note (Signed)
[facility]  CONSULT NOTE    Date: 11/24/2019                  Patient Name:  Kurt [name]  MRN: [id number]  DOB: 09/01/1956  Age / Sex: 63 y.o., male         PCP: Venia Carbon, MD                 Service Requesting Consult: Dr. Mortimer Fries                 Reason for Consult: Acute Renal Failure            History of Present Illness: Kurt [name] is a 63 y.o. 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    Medications: Outpatient medications: Medications Prior to Admission  Medication Sig Dispense Refill Last Dose  . aspirin 81 MG tablet Take 81 mg by mouth daily.   unknown at unknown  . hydrochlorothiazide (HYDRODIURIL) 12.5 MG tablet Take 1 tablet by mouth once daily 90 tablet 0 unknown at unknown  . losartan (COZAAR) 50 MG tablet TAKE 1 TABLET BY MOUTH ONCE DAILY -  NEED  OFFICE  VISIT 90 tablet 0 unknown at unknown    Current medications: Current Facility-Administered Medications  Medication Dose Route Frequency Provider Last Rate Last Admin  . 0.9 %  sodium chloride infusion   Intravenous Continuous Vanessa [place], MD      . 0.9 %  sodium chloride infusion  250 mL Intravenous PRN Flora Lipps, MD      . 0.9 %  sodium chloride infusion  250 mL Intravenous Continuous Kasa, Kurian, MD      . 0.9 %  sodium chloride infusion   Intravenous Continuous Kasa, Kurian, MD      . 0.9 %  sodium chloride infusion  250 mL Intravenous PRN Callwood, Dwayne D, MD      . 0.9% sodium chloride infusion  1 mL/kg/hr Intravenous Continuous Callwood, Dwayne D, MD      . acetaminophen (TYLENOL) tablet 650 mg  650  mg Oral Q4H PRN Flora Lipps, MD      . acetaminophen (TYLENOL) tablet 650 mg  650 mg Oral Q4H PRN Callwood, Dwayne D, MD      . albuterol (PROVENTIL) (2.5 MG/3ML) 0.083% nebulizer solution 2.5 mg  2.5 mg Nebulization Q2H PRN Flora Lipps, MD      . amiodarone (NEXTERONE PREMIX) 360-4.14 MG/200ML-% (1.8 mg/mL) IV infusion  30 mg/hr Intravenous Continuous [name], NP   Stopped at 11/24/19 [phone]  . aspirin chewable tablet 81 mg  81 mg Oral Daily Yolonda Kida, MD   Stopped at 11/24/19 0945  . aspirin suppository 300 mg  300 mg Rectal NOW Flora Lipps, MD      . cangrelor [facility]) 50 mg in sodium chloride 0.9 % 250 mL (0.2 mg/mL) infusion  4 mcg/kg/min Intravenous Continuous Callwood, Dwayne D, MD   Stopped at 11/23/19 2010  . chlorhexidine gluconate (MEDLINE KIT) (PERIDEX) 0.12 % solution 15 mL  15 mL Mouth Rinse BID [name] D, NP   15 mL at 11/24/19 0824  . Chlorhexidine Gluconate Cloth 2 %  PADS 6 each  6 each Topical Daily [name], NP   6 each at 11/24/19 [phone]  . famotidine (PEPCID) IVPB 20 mg premix  20 mg Intravenous Q12H Flora Lipps, MD   Stopped at 11/24/19 0020  . fentaNYL (SUBLIMAZE) bolus via infusion 50 mcg  50 mcg Intravenous Q15 min PRN [name], NP      . fentaNYL (SUBLIMAZE) injection 50 mcg  50 mcg Intravenous Once [name] D, NP      . fentaNYL 2527mg in NS 2544m(1090mml) infusion-PREMIX  50-200 mcg/hr Intravenous Continuous KeeDarel Hong NP 12.5 mL/hr at 11/24/19 0910 125 mcg/hr at 11/24/19 0910  . heparin injection 5,000 Units  5,000 Units Subcutaneous Q8H KasFlora LippsD   5,000 Units at 11/24/19 0612  . hydrALAZINE (APRESOLINE) injection 10 mg  10 mg Intravenous Q20 Min PRN Callwood, Dwayne D, MD      . hydrocortisone sodium succinate (SOLU-CORTEF) 100 MG injection 50 mg  50 mg Intravenous Q6H KeeDarel Hong NP   50 mg at 11/24/19 0600  . insulin aspart (novoLOG) injection 0-15 Units  0-15 Units Subcutaneous Q4H KeeDarel Hong NP   2 Units at 11/24/19 0100  . ipratropium-albuterol (DUONEB) 0.5-2.5 (3) MG/3ML nebulizer solution 3 mL  3 mL Nebulization Q4H Kasa, Kurian, MD      . labetalol (NORMODYNE) injection 10 mg  10 mg Intravenous Q10 min PRN Callwood, Dwayne D, MD      . MEDLINE mouth rinse  15 mL Mouth Rinse 10 times per day KeeDarel Hong NP   15 mL at 11/24/19 0907  . midazolam (VERSED) injection 2 mg  2 mg Intravenous Q15 min PRN KeeBradly BienenstockP   2 mg at 11/24/19 [id number] midazolam (VERSED) injection 2 mg  2 mg Intravenous Q2H PRN KeeDarel Hong NP      . norepinephrine (LEVOPHED) 16 mg in 250m64memix infusion  0-40 mcg/min Intravenous Titrated KeenDarel HongNP 28.1 mL/hr at 11/24/19 0600 30 mcg/min at 11/24/19 0600  . ondansetron (ZOFRAN) injection 4 mg  4 mg Intravenous Q6H PRN Callwood, Dwayne D, MD      . phenylephrine CONCENTRATED 100mg32msodium chloride 0.9% 250mL 46mmg/mL44mnfusion  0-400 mcg/min Intravenous Titrated [place], [place]ly BienenstockStopped at 11/24/19 0444  . piperacillin-tazobactam (ZOSYN) IVPB 3.375 g  3.375 g Intravenous Q8H [place]ene, [name]12.5 mL/hr at 11/24/19 0611 3.375 g at 11/24/19 0611  .8676pofol (DIPRIVAN) 1000 MG/100ML infusion  5-80 mcg/kg/min Intravenous Titrated [place], [name]9.95 mL/hr at 11/24/19 0600 20 mcg/kg/min at 11/24/19 0600  . rosuvastatin (CRESTOR) tablet 40 mg  40 mg Oral q1800 Callwood, Dwayne D, MD      . sodium bicarbonate 150 mEq in sterile water 1,000 mL infusion   Intravenous Continuous Keene, [name]125 mL/hr at 11/24/19 0600 Rate Verify at 11/24/19 0600  . sodium chloride flush (NS) 0.9 % injection 3 mL  3 mL Intravenous Q12H Callwood, Dwayne D, MD   3 mL at 11/23/19 2250  . sodium chloride flush (NS) 0.9 % injection 3 mL  3 mL Intravenous Q12H Kasa, KFlora Lipps3 mL at 11/24/19 0907  . sodium chloride flush (NS) 0.9 % injection 3 mL  3 mL Intravenous PRN Kasa, KFlora Lipps   . sodium chloride flush (NS)  0.9 % injection 3 mL  3 mL Intravenous Q12H Callwood, Dwayne D, MD      .  sodium chloride flush (NS) 0.9 % injection 3 mL  3 mL Intravenous PRN Callwood, Dwayne D, MD      . sodium zirconium cyclosilicate (LOKELMA) packet 5 g  5 g Oral BID [name], NP   Stopped at 11/24/19 0945  . ticagrelor (BRILINTA) tablet 90 mg  90 mg Oral BID Callwood, Dwayne D, MD      . vasopressin (PITRESSIN) 40 Units in sodium chloride 0.9 % 250 mL (0.16 Units/mL) infusion  0.03 Units/min Intravenous Continuous [name], NP   Stopped at 11/24/19 [phone]  . vecuronium (NORCURON) injection 10 mg  10 mg Intravenous Q2H PRN [name], NP       Facility-Administered Medications Ordered in Other Encounters  Medication Dose Route Frequency Provider Last Rate Last Admin  . perflutren lipid microspheres (DEFINITY) IV suspension  1-10 mL Intravenous PRN [name], NP   3 mL at 11/24/19 0845      Allergies: No Known Allergies    Past Medical History: Past Medical History:  Diagnosis Date  . COPD (chronic obstructive pulmonary disease) ([place])   . ED (erectile dysfunction)   . Hypertension   . Lumbar disc disease      Past Surgical History: Past Surgical History:  Procedure Laterality Date  . APPENDECTOMY       Family History: Family History  Problem Relation Age of Onset  . Hyperlipidemia Father   . Cancer Father   . Heart disease Father        heart valve disease (from Agent Orange?)  . Alcohol abuse Mother   . Hypertension Neg Hx   . Diabetes Neg Hx   . Colon cancer Neg Hx   . Colon polyps Neg Hx   . Pancreatic cancer Neg Hx   . Rectal cancer Neg Hx   . Stomach cancer Neg Hx      Social History: Social History   Socioeconomic History  . Marital status: Married    Spouse name: Not on file  . Number of children: 1  . Years of education: Not on file  . Highest education level: Not on file  Occupational History  . 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 Smoking status: Current Every Day Smoker  . Smokeless tobacco: Never Used  Substance and Sexual Activity  . Alcohol use: Yes  . Drug use: Yes    Types: Marijuana  . Sexual activity: Not on file  Other Topics Concern  . Not on file  Social History Narrative  . Not on file   Social Determinants of Health   Financial Resource Strain:   . Difficulty of Paying Living Expenses: Not on file  Food Insecurity:   . Worried About Charity fundraiser in the Last Year: Not on file  . Ran Out of Food in the Last Year: Not on file  Transportation Needs:   . Lack of Transportation (Medical): Not on file  . Lack of Transportation (Non-Medical): Not on file  Physical Activity:   . Days of Exercise per Week: Not on file  . Minutes of Exercise per Session: Not on file  Stress:   . Feeling of Stress : Not on file  Social Connections:   . Frequency of Communication with Friends and Family: Not on file  . Frequency of Social Gatherings with Friends and Family: Not on file  . Attends Religious Services: Not on file  . Active Member of Clubs or Organizations: Not  on file  . Attends Archivist Meetings: Not on file  . Marital Status: Not on file  Intimate Partner Violence:   . Fear of Current or Ex-Partner: Not on file  . Emotionally Abused: Not on file  . Physically Abused: Not on file  . Sexually Abused: Not on file     Review of Systems: Review of Systems  Unable to perform ROS: Critical illness    Vital Signs: Blood pressure 93/73, pulse (!) 59, temperature (!) 96.8 F (36 C), temperature source Bladder, resp. rate (!) 21, height '5\' 9"'  (1.753 m), weight 89.1 kg, SpO2 99 %.  Weight trends: Filed Weights   11/23/19 1628 11/23/19 2100 11/24/19 0500  Weight: 86.2 kg 82.9 kg 89.1 kg    Physical Exam: General: Critically ill, cooling protocol  Head: ETT   Eyes: Anicteric, PERRL  Neck:  trachea midline  Lungs:  PRVC FiO2 100%  Heart:  bradycardia  Abdomen:  Soft   Extremities: no peripheral edema.  Neurologic: Intubated and sedated  Skin: No lesions  Access: none     Lab results: Basic Metabolic Panel: Recent Labs  Lab 11/23/19 1624 11/23/19 2314 11/24/19 0051 11/24/19 0838  NA  --  141 142 143  K  --  5.9* 5.8* 4.2  CL  --  112* 113* 110  CO2  --  23 20* 24  GLUCOSE  --  164* 207* 140*  BUN  --  27* 28* 34*  CREATININE  --  1.46* 1.40* 1.44*  CALCIUM  --  6.7* 6.1* 6.9*  MG 2.3  --   --   --     Liver Function Tests: Recent Labs  Lab 11/23/19 1627 11/24/19 0051  AST 65* 143*  ALT 42 56*  ALKPHOS 95 53  BILITOT 0.7 0.7  PROT 6.6 5.3*  ALBUMIN 3.3* 2.7*   No results for input(s): LIPASE, AMYLASE in the last 168 hours. No results for input(s): AMMONIA in the last 168 hours.  CBC: Recent Labs  Lab 11/23/19 1627 11/23/19 2314 11/24/19 0051  WBC 20.6* 28.8* 28.0*  NEUTROABS 13.4*  --   --   HGB 16.8 15.1 15.6  HCT 50.1 48.3 47.4  MCV 100.0 105.0* 101.5*  PLT 195 200 207    Cardiac Enzymes: No results for input(s): CKTOTAL, CKMB, CKMBINDEX, TROPONINI in the last 168 hours.  BNP: Invalid input(s): POCBNP  CBG: Recent Labs  Lab 11/23/19 2000 11/24/19 0018 11/24/19 0349 11/24/19 0828  GLUCAP 170* 122* 137* 116*    Microbiology: Results for orders placed or performed during the hospital encounter of 11/23/19  Respiratory Panel by RT PCR (Flu A&B, Covid) - Nasopharyngeal Swab     Status: None   Collection Time: 11/23/19  4:24 PM   Specimen: Nasopharyngeal Swab  Result Value Ref Range Status   SARS Coronavirus 2 by RT PCR NEGATIVE NEGATIVE Final    Comment: (NOTE) SARS-CoV-2 target nucleic acids are NOT DETECTED. The SARS-CoV-2 RNA is generally detectable in upper respiratoy specimens during the acute phase of infection. The lowest concentration of SARS-CoV-2 viral copies this assay can detect is 131 copies/mL. A negative result does not preclude SARS-Cov-2 infection and  should not be used as the sole basis for treatment or other patient management decisions. A negative result may occur with  improper specimen collection/handling, submission of specimen other than nasopharyngeal swab, presence of viral mutation(s) within the areas targeted by this assay, and inadequate number of viral copies (<131 copies/mL). A negative result  must be combined with clinical observations, patient history, and epidemiological information. The expected result is Negative. Fact Sheet for Patients:  PinkCheek.be Fact Sheet for Healthcare Providers:  GravelBags.it This test is not yet ap proved or cleared by the [place] FDA and  has been authorized for detection and/or diagnosis of SARS-CoV-2 by FDA under an Emergency Use Authorization (EUA). This EUA will remain  in effect (meaning this test can be used) for the duration of the COVID-19 declaration under Section 564(b)(1) of the Act, 21 U.S.C. section 360bbb-3(b)(1), unless the authorization is terminated or revoked sooner.    Influenza A by PCR NEGATIVE NEGATIVE Final   Influenza B by PCR NEGATIVE NEGATIVE Final    Comment: (NOTE) The Xpert Xpress SARS-CoV-2/FLU/RSV assay is intended as an aid in  the diagnosis of influenza from Nasopharyngeal swab specimens and  should not be used as a sole basis for treatment. Nasal washings and  aspirates are unacceptable for Xpert Xpress SARS-CoV-2/FLU/RSV  testing. Fact Sheet for Patients: PinkCheek.be Fact Sheet for Healthcare Providers: GravelBags.it This test is not yet approved or cleared by the [place] FDA and  has been authorized for detection and/or diagnosis of SARS-CoV-2 by  FDA under an Emergency Use Authorization (EUA). This EUA will remain  in effect (meaning this test can be used) for the duration of the  Covid-19 declaration under Section  564(b)(1) of the Act, 21  U.S.C. section 360bbb-3(b)(1), unless the authorization is  terminated or revoked. Performed at [facility], [address] [place], [place] [address]   MRSA PCR Screening     Status: None   Collection Time: 11/23/19  9:44 PM   Specimen: Nasopharyngeal  Result Value Ref Range Status   MRSA by PCR NEGATIVE NEGATIVE Final    Comment:        The GeneXpert MRSA Assay (FDA approved for NASAL specimens only), is one component of a comprehensive MRSA colonization surveillance program. It is not intended to diagnose MRSA infection nor to guide or monitor treatment for MRSA infections. Performed at [facility], [address] [place], [place] [address]   CULTURE, BLOOD (ROUTINE X 2) w Reflex to ID Panel     Status: None (Preliminary result)   Collection Time: 11/24/19  4:03 AM   Specimen: BLOOD  Result Value Ref Range Status   Specimen Description BLOOD LEFT UPPER ARM  Final   Special Requests   Final    BOTTLES DRAWN AEROBIC AND ANAEROBIC Blood Culture adequate volume   Culture   Final    NO GROWTH <12 HOURS Performed at [facility], [address], [place], [place] [address]    Report Status PENDING  Incomplete  CULTURE, BLOOD (ROUTINE X 2) w Reflex to ID Panel     Status: None (Preliminary result)   Collection Time: 11/24/19  4:03 AM   Specimen: BLOOD  Result Value Ref Range Status   Specimen Description BLOOD BLOOD LEFT FOREARM  Final   Special Requests   Final    BOTTLES DRAWN AEROBIC ONLY Blood Culture results may not be optimal due to an inadequate volume of blood received in culture bottles   Culture   Final    NO GROWTH <12 HOURS Performed at [facility], [address], [place], [place] [address]    Report Status PENDING  Incomplete    Coagulation Studies: Recent Labs    11/23/19 1627 11/23/19 2314  LABPROT 15.8* 18.0*  INR 1.3* 1.5*  Urinalysis: Recent Labs    11/23/19 1627   COLORURINE YELLOW*  LABSPEC 1.010  PHURINE 7.0  GLUCOSEU >=500*  HGBUR MODERATE*  BILIRUBINUR NEGATIVE  KETONESUR NEGATIVE  PROTEINUR 100*  NITRITE NEGATIVE  LEUKOCYTESUR NEGATIVE      Imaging: DG Chest 1 View  Result Date: 11/23/2019 CLINICAL DATA:  Intubation. Cardiac arrest. EXAM: CHEST  1 VIEW COMPARISON:  03/16/2018 FINDINGS: Endotracheal tube is in good position 3 cm above the carina. Heart size and pulmonary vascularity are normal. Slight haziness in the mid and upper lung zones may represent mild pulmonary edema. No effusions. No pneumothorax. IMPRESSION: 1. Endotracheal tube in good position. 2. Hazy infiltrates in the mid and upper lung zones may represent mild pulmonary edema. Aspiration pneumonitis could also give this appearance. Electronically Signed   By: Lorriane Shire M.D.   On: 11/23/2019 17:24   DG Abd 1 View  Result Date: 11/23/2019 CLINICAL DATA:  Central line and OG placement EXAM: ABDOMEN - 1 VIEW COMPARISON:  Same day chest radiograph FINDINGS: Pacer pads overlie the lower chest. Telemetry leads in monitoring devices also project over the upper abdomen and lower chest. The tip of a transesophageal tube terminates near the level of the gastric antrum with the side port beyond the GE junction. Hazy opacities in the lungs are better seen on dedicated chest radiograph performed concurrently. Coarse radiodensities projecting over both renal shadows worrisome for urolithiasis if this patient has not recently received contrast media. Degenerative changes present the spine. No high-grade obstructive bowel gas pattern. IMPRESSION: 1. Transesophageal tube tip is near the level of the gastric antrum with the side port beyond the GE junction. 2. No high-grade obstructive bowel gas pattern. 3. Coarse density over both renal shadows could reflect urolithiasis if this patient has not recently received contrast media. Electronically Signed   By: Lovena Le M.D.   On: 11/23/2019  21:51   CT Head Wo Contrast  Result Date: 11/23/2019 CLINICAL DATA:  Head trauma. Cardiac arrest. EXAM: CT HEAD WITHOUT CONTRAST CT CERVICAL SPINE WITHOUT CONTRAST TECHNIQUE: Multidetector CT imaging of the head and cervical spine was performed following the standard protocol without intravenous contrast. Multiplanar CT image reconstructions of the cervical spine were also generated. COMPARISON:  None. FINDINGS: CT HEAD FINDINGS Brain: No evidence of acute infarction, hemorrhage, hydrocephalus, extra-axial collection or mass lesion/mass effect. Vascular: No hyperdense vessel or unexpected calcification. Skull: Normal. Negative for fracture or focal lesion. Sinuses/Orbits: No acute finding. Evidence of previous maxillary sinus surgery. Slight mucosal thickening in the ethmoid air cells. Other: None CT CERVICAL SPINE FINDINGS Alignment: Normal. Skull base and vertebrae: No acute fracture. No primary bone lesion or focal pathologic process. Soft tissues and spinal canal: No prevertebral fluid or swelling. No visible canal hematoma. Disc levels: There are no disc protrusions or significant disc bulges in the cervical spine. There is no spinal stenosis. There is moderate left foraminal stenosis at C6-7 due to facet arthritis. The neural foramina throughout the remainder of the cervical spine are widely patent. Moderate right facet arthritis at C3-4. Upper chest: Focal area of infiltrate at the right lung apex posteriorly. Patchy haziness in both lung apices. Endotracheal tube in place. Other: None IMPRESSION: 1. Normal CT scan of the head. 2. No significant abnormality of the cervical spine. Moderate left foraminal stenosis at C6-7. Electronically Signed   By: Lorriane Shire M.D.   On: 11/23/2019 17:30   CT Cervical Spine Wo Contrast  Result Date: 11/23/2019 CLINICAL DATA:  Head trauma.  Cardiac arrest. EXAM: CT HEAD WITHOUT CONTRAST CT CERVICAL SPINE WITHOUT CONTRAST TECHNIQUE: Multidetector CT imaging of the  head and cervical spine was performed following the standard protocol without intravenous contrast. Multiplanar CT image reconstructions of the cervical spine were also generated. COMPARISON:  None. FINDINGS: CT HEAD FINDINGS Brain: No evidence of acute infarction, hemorrhage, hydrocephalus, extra-axial collection or mass lesion/mass effect. Vascular: No hyperdense vessel or unexpected calcification. Skull: Normal. Negative for fracture or focal lesion. Sinuses/Orbits: No acute finding. Evidence of previous maxillary sinus surgery. Slight mucosal thickening in the ethmoid air cells. Other: None CT CERVICAL SPINE FINDINGS Alignment: Normal. Skull base and vertebrae: No acute fracture. No primary bone lesion or focal pathologic process. Soft tissues and spinal canal: No prevertebral fluid or swelling. No visible canal hematoma. Disc levels: There are no disc protrusions or significant disc bulges in the cervical spine. There is no spinal stenosis. There is moderate left foraminal stenosis at C6-7 due to facet arthritis. The neural foramina throughout the remainder of the cervical spine are widely patent. Moderate right facet arthritis at C3-4. Upper chest: Focal area of infiltrate at the right lung apex posteriorly. Patchy haziness in both lung apices. Endotracheal tube in place. Other: None IMPRESSION: 1. Normal CT scan of the head. 2. No significant abnormality of the cervical spine. Moderate left foraminal stenosis at C6-7. Electronically Signed   By: Lorriane Shire M.D.   On: 11/23/2019 17:30   CARDIAC CATHETERIZATION  Result Date: 11/23/2019  Prox LAD lesion is 50% stenosed.  Prox RCA lesion is 100% stenosed. Infarct-related artery TIMI 0 flow  Dist Cx lesion is 90% stenosed.  Normal left ventricular function ejection fraction between 50 and 55%  Conclusion STEMI presentation 100% proximal RCA TIMI 0 flow Successful PCI and stent with DES 3.0 x 26 mm resolute Onyx to 13 atm to proximal RCA restoring  TIMI-3 flow Left main free of disease LAD large with a proximal 50% lesion Circumflex very large with a distal 90% distal circumflex lesion Mixed dominant system Preserved left ventricular function of 50 to 55% Circumflex lesion intervention was deferred   DG Chest Port 1 View  Result Date: 11/23/2019 CLINICAL DATA:  Central line placement and OG placement EXAM: PORTABLE CHEST 1 VIEW COMPARISON:  Radiograph 11/23/2019 FINDINGS: Endotracheal tube terminates in the mid trachea, 3 cm from the carina. A transesophageal tube tip and side port distal to the GE junction, terminating about the level of the gastric antrum. A right IJ catheter tip is present in the mid to upper SVC. Overlying pacer pads are noted. Telemetry wires project over lower chest as well. Hazy opacities in the mid to upper lungs are similar to prior. No pneumothorax. No effusion. The cardiomediastinal contours are unremarkable. Coarse calcifications noted upper pole left kidney. IMPRESSION: 1. Endotracheal tube 3 cm from the carina. 2. A transesophageal tube tip and side port are distal to the GE junction. 3. Right IJ catheter tip in the mid to upper SVC. 4. Stable hazy opacities in the mid to upper lungs bilaterally. 5. Left upper pole renal calcification. Electronically Signed   By: Lovena Le M.D.   On: 11/23/2019 21:48      Assessment & Plan: Kurt [name] is a 63 y.o.  male with COPD, hypertension,  , who was admitted to [facility] on 11/23/2019 for Cardiac arrest [facility]) [I46.9] ST elevation myocardial infarction involving right coronary artery ([place]) [I21.11] Cocaine use [F14.90] STEMI involving right coronary artery ([place]) [I21.11]  1. Acute renal failure  with metabolic acidosis and hyperkalemia: baseline creatinine of 1.02, normal GFR on 03/16/2018.  Nonoliguric urine output Urine with glycosuria, hematuria and proteinuria. Cardiac catheterization with IV dye exposure on 12/24.  - holding lisinoporil - Lokelma  2.  Hypotension with cardiogenic shock: on cooling protocol.  Echocardiogram pending - requiring vasopressors: norepinephrine and vasopressin - appreciate cardiology input  LOS: 1 Maven Rosander 12/25/202010:11 AM

## 2019-11-24 NOTE — Progress Notes (Signed)
Assisted tele visit to patient with son.  Shannyn Jankowiak P, RN  

## 2019-11-24 NOTE — Progress Notes (Signed)
[facility]    SUBJECTIVE: Intubated sedated   Vitals:   11/24/19 1900 11/24/19 1911 11/24/19 2000 11/24/19 2100  BP: (!) 100/59  104/64 (!) 108/57  Pulse: 62  64 64  Resp: (!) '25  10 13  ' Temp: (!) 97 F (36.1 C)  97.7 F (36.5 C) 97.7 F (36.5 C)  TempSrc:   Bladder Bladder  SpO2: 95% 97% 93% 95%  Weight:      Height:         Intake/Output Summary (Last 24 hours) at 11/24/2019 2117 Last data filed at 11/24/2019 1800 Gross per 24 hour  Intake 2319.57 ml  Output 1395 ml  Net 924.57 ml      PHYSICAL EXAM  General: Intubated sedated on a vent no distress HEENT:  Normocephalic and atramatic Neck:  No JVD.  Lungs: Clear bilaterally to auscultation and percussion. Heart: HRRR . Normal S1 and S2 without gallops or murmurs.  Abdomen: Bowel sounds are positive, abdomen soft and non-tender  Msk:  Back normal, normal gait. Normal strength and tone for age. Extremities: No clubbing, cyanosis or edema.   Neuro: intubated sedated. Psych:  defered   LABS: Basic Metabolic Panel: Recent Labs    11/23/19 1624 11/24/19 0051 11/24/19 0838  NA  --  142 143  K  --  5.8* 4.2  CL  --  113* 110  CO2  --  20* 24  GLUCOSE  --  207* 140*  BUN  --  28* 34*  CREATININE  --  1.40* 1.44*  CALCIUM  --  6.1* 6.9*  MG 2.3  --   --    Liver Function Tests: Recent Labs    11/23/19 1627 11/24/19 0051  AST 65* 143*  ALT 42 56*  ALKPHOS 95 53  BILITOT 0.7 0.7  PROT 6.6 5.3*  ALBUMIN 3.3* 2.7*   No results for input(s): LIPASE, AMYLASE in the last 72 hours. CBC: Recent Labs    11/23/19 1627 11/23/19 2314 11/24/19 0051  WBC 20.6* 28.8* 28.0*  NEUTROABS 13.4*  --   --   HGB 16.8 15.1 15.6  HCT 50.1 48.3 47.4  MCV 100.0 105.0* 101.5*  PLT 195 200 207   Cardiac Enzymes: No results for input(s): CKTOTAL, CKMB, CKMBINDEX, TROPONINI in the last 72 hours. BNP: Invalid input(s): POCBNP D-Dimer: No results for input(s): DDIMER in the last 72 hours. Hemoglobin  A1C: Recent Labs    11/23/19 1627  HGBA1C 4.8   Fasting Lipid Panel: Recent Labs    11/23/19 1627 11/23/19 2314  CHOL 157  --   HDL 43  --   LDLCALC 92  --   TRIG 110 233*  CHOLHDL 3.7  --    Thyroid Function Tests: No results for input(s): TSH, T4TOTAL, T3FREE, THYROIDAB in the last 72 hours.  Invalid input(s): FREET3 Anemia Panel: No results for input(s): VITAMINB12, FOLATE, FERRITIN, TIBC, IRON, RETICCTPCT in the last 72 hours.  DG Chest 1 View  Result Date: 11/23/2019 CLINICAL DATA:  Intubation. Cardiac arrest. EXAM: CHEST  1 VIEW COMPARISON:  03/16/2018 FINDINGS: Endotracheal tube is in good position 3 cm above the carina. Heart size and pulmonary vascularity are normal. Slight haziness in the mid and upper lung zones may represent mild pulmonary edema. No effusions. No pneumothorax. IMPRESSION: 1. Endotracheal tube in good position. 2. Hazy infiltrates in the mid and upper lung zones may represent mild pulmonary edema. Aspiration pneumonitis could also give this appearance. Electronically Signed   By: Lorriane Shire M.D.  On: 11/23/2019 17:24   DG Abd 1 View  Result Date: 11/23/2019 CLINICAL DATA:  Central line and OG placement EXAM: ABDOMEN - 1 VIEW COMPARISON:  Same day chest radiograph FINDINGS: Pacer pads overlie the lower chest. Telemetry leads in monitoring devices also project over the upper abdomen and lower chest. The tip of a transesophageal tube terminates near the level of the gastric antrum with the side port beyond the GE junction. Hazy opacities in the lungs are better seen on dedicated chest radiograph performed concurrently. Coarse radiodensities projecting over both renal shadows worrisome for urolithiasis if this patient has not recently received contrast media. Degenerative changes present the spine. No high-grade obstructive bowel gas pattern. IMPRESSION: 1. Transesophageal tube tip is near the level of the gastric antrum with the side port beyond the GE  junction. 2. No high-grade obstructive bowel gas pattern. 3. Coarse density over both renal shadows could reflect urolithiasis if this patient has not recently received contrast media. Electronically Signed   By: Lovena Le M.D.   On: 11/23/2019 21:51   CT Head Wo Contrast  Result Date: 11/23/2019 CLINICAL DATA:  Head trauma. Cardiac arrest. EXAM: CT HEAD WITHOUT CONTRAST CT CERVICAL SPINE WITHOUT CONTRAST TECHNIQUE: Multidetector CT imaging of the head and cervical spine was performed following the standard protocol without intravenous contrast. Multiplanar CT image reconstructions of the cervical spine were also generated. COMPARISON:  None. FINDINGS: CT HEAD FINDINGS Brain: No evidence of acute infarction, hemorrhage, hydrocephalus, extra-axial collection or mass lesion/mass effect. Vascular: No hyperdense vessel or unexpected calcification. Skull: Normal. Negative for fracture or focal lesion. Sinuses/Orbits: No acute finding. Evidence of previous maxillary sinus surgery. Slight mucosal thickening in the ethmoid air cells. Other: None CT CERVICAL SPINE FINDINGS Alignment: Normal. Skull base and vertebrae: No acute fracture. No primary bone lesion or focal pathologic process. Soft tissues and spinal canal: No prevertebral fluid or swelling. No visible canal hematoma. Disc levels: There are no disc protrusions or significant disc bulges in the cervical spine. There is no spinal stenosis. There is moderate left foraminal stenosis at C6-7 due to facet arthritis. The neural foramina throughout the remainder of the cervical spine are widely patent. Moderate right facet arthritis at C3-4. Upper chest: Focal area of infiltrate at the right lung apex posteriorly. Patchy haziness in both lung apices. Endotracheal tube in place. Other: None IMPRESSION: 1. Normal CT scan of the head. 2. No significant abnormality of the cervical spine. Moderate left foraminal stenosis at C6-7. Electronically Signed   By: Lorriane Shire M.D.   On: 11/23/2019 17:30   CT Cervical Spine Wo Contrast  Result Date: 11/23/2019 CLINICAL DATA:  Head trauma. Cardiac arrest. EXAM: CT HEAD WITHOUT CONTRAST CT CERVICAL SPINE WITHOUT CONTRAST TECHNIQUE: Multidetector CT imaging of the head and cervical spine was performed following the standard protocol without intravenous contrast. Multiplanar CT image reconstructions of the cervical spine were also generated. COMPARISON:  None. FINDINGS: CT HEAD FINDINGS Brain: No evidence of acute infarction, hemorrhage, hydrocephalus, extra-axial collection or mass lesion/mass effect. Vascular: No hyperdense vessel or unexpected calcification. Skull: Normal. Negative for fracture or focal lesion. Sinuses/Orbits: No acute finding. Evidence of previous maxillary sinus surgery. Slight mucosal thickening in the ethmoid air cells. Other: None CT CERVICAL SPINE FINDINGS Alignment: Normal. Skull base and vertebrae: No acute fracture. No primary bone lesion or focal pathologic process. Soft tissues and spinal canal: No prevertebral fluid or swelling. No visible canal hematoma. Disc levels: There are no disc protrusions or significant disc  bulges in the cervical spine. There is no spinal stenosis. There is moderate left foraminal stenosis at C6-7 due to facet arthritis. The neural foramina throughout the remainder of the cervical spine are widely patent. Moderate right facet arthritis at C3-4. Upper chest: Focal area of infiltrate at the right lung apex posteriorly. Patchy haziness in both lung apices. Endotracheal tube in place. Other: None IMPRESSION: 1. Normal CT scan of the head. 2. No significant abnormality of the cervical spine. Moderate left foraminal stenosis at C6-7. Electronically Signed   By: Lorriane Shire M.D.   On: 11/23/2019 17:30   CARDIAC CATHETERIZATION  Result Date: 11/23/2019  Prox LAD lesion is 50% stenosed.  Prox RCA lesion is 100% stenosed. Infarct-related artery TIMI 0 flow  Dist Cx  lesion is 90% stenosed.  Normal left ventricular function ejection fraction between 50 and 55%  Conclusion STEMI presentation 100% proximal RCA TIMI 0 flow Successful PCI and stent with DES 3.0 x 26 mm resolute Onyx to 13 atm to proximal RCA restoring TIMI-3 flow Left main free of disease LAD large with a proximal 50% lesion Circumflex very large with a distal 90% distal circumflex lesion Mixed dominant system Preserved left ventricular function of 50 to 55% Circumflex lesion intervention was deferred   DG Chest Port 1 View  Result Date: 11/23/2019 CLINICAL DATA:  Central line placement and OG placement EXAM: PORTABLE CHEST 1 VIEW COMPARISON:  Radiograph 11/23/2019 FINDINGS: Endotracheal tube terminates in the mid trachea, 3 cm from the carina. A transesophageal tube tip and side port distal to the GE junction, terminating about the level of the gastric antrum. A right IJ catheter tip is present in the mid to upper SVC. Overlying pacer pads are noted. Telemetry wires project over lower chest as well. Hazy opacities in the mid to upper lungs are similar to prior. No pneumothorax. No effusion. The cardiomediastinal contours are unremarkable. Coarse calcifications noted upper pole left kidney. IMPRESSION: 1. Endotracheal tube 3 cm from the carina. 2. A transesophageal tube tip and side port are distal to the GE junction. 3. Right IJ catheter tip in the mid to upper SVC. 4. Stable hazy opacities in the mid to upper lungs bilaterally. 5. Left upper pole renal calcification. Electronically Signed   By: Lovena Le M.D.   On: 11/23/2019 21:48   ECHOCARDIOGRAM COMPLETE  Result Date: 11/24/2019   ECHOCARDIOGRAM REPORT   Patient Name:   Kurt [name] Date of Exam: 11/24/2019 Medical Rec #:  [id number]      Height:       69.0 in Accession #:    [id number]     Weight:       196.4 lb Date of Birth:  Dec 29, 1955      BSA:          2.05 m Patient Age:    63 years       BP:           125/95 mmHg Patient Gender: M               HR:           52 bpm. Exam Location:  [facility] Procedure: 2D Echo and Intracardiac Opacification Agent Indications:     Cardiac Arrest  History:         Patient has no prior history of Echocardiogram examinations.                  CAD, COPD; Risk Factors:Cocaine Abuse.  Sonographer:  LTM Referring Phys:  [id number] [name] Diagnosing Phys: Yolonda Kida MD  Sonographer Comments: Echo performed with patient supine and on artificial respirator, no parasternal window and no apical window. Image acquisition challenging due to COPD. IMPRESSIONS  1. Left ventricular ejection fraction, by visual estimation, is 50 to 55%. The left ventricle has normal function. There is no left ventricular hypertrophy.  2. Definity contrast agent was given IV to delineate the left ventricular endocardial borders.  3. The left ventricle has no regional wall motion abnormalities.  4. Akinetic right ventricular.  5. Global right ventricle has severely reduced systolic function.The right ventricular size is moderately enlarged. No increase in right ventricular wall thickness.  6. Left atrial size was normal.  7. Right atrial size was normal.  8. The mitral valve is normal in structure. No evidence of mitral valve regurgitation.  9. The tricuspid valve is normal in structure. 10. The aortic valve is normal in structure. Aortic valve regurgitation is not visualized. 11. The pulmonic valve was not well visualized. Pulmonic valve regurgitation is not visualized. 12. Normal pulmonary artery systolic pressure. 13. Left ventricular ejection fraction by PLAX is is 63 % FINDINGS  Left Ventricle: Left ventricular ejection fraction, by visual estimation, is 50 to 55%. Left ventricular ejection fraction by PLAX is is 63 % The left ventricle has normal function. Definity contrast agent was given IV to delineate the left ventricular endocardial borders. The left ventricle has no regional wall motion abnormalities. There is no left ventricular  hypertrophy. Right Ventricle: The right ventricular size is moderately enlarged. No increase in right ventricular wall thickness. Global RV systolic function is has severely reduced systolic function. The tricuspid regurgitant velocity is 1.61 m/s, and with an assumed right atrial pressure of 10 mmHg, the estimated right ventricular systolic pressure is normal at 20.4 mmHg. The motion of the right ventricle akinetic. Left Atrium: Left atrial size was normal in size. Right Atrium: Right atrial size was normal in size Pericardium: There is no evidence of pericardial effusion. Mitral Valve: The mitral valve is normal in structure. No evidence of mitral valve regurgitation. Tricuspid Valve: The tricuspid valve is normal in structure. Tricuspid valve regurgitation is trivial. Aortic Valve: The aortic valve is normal in structure. Aortic valve regurgitation is not visualized. Aortic valve mean gradient measures 2.0 mmHg. Aortic valve peak gradient measures 3.4 mmHg. Aortic valve area, by VTI measures 3.01 cm. Pulmonic Valve: The pulmonic valve was not well visualized. Pulmonic valve regurgitation is not visualized. Pulmonic regurgitation is not visualized. Aorta: The aortic root is normal in size and structure. IAS/Shunts: No atrial level shunt detected by color flow Doppler.  LEFT VENTRICLE PLAX 2D LV EF:         Left ventricular ejection fraction by PLAX is is 63 % LVIDd:         5.23 cm LVIDs:         4.19 cm LV PW:         0.88 cm LV IVS:        1.18 cm LVOT diam:     2.50 cm LV SV:         53 ml LV SV Index:   25.24 LVOT Area:     4.91 cm  LV Volumes (MOD) LV area d, A4C:    21.80 cm LV area s, A4C:    10.20 cm LV major d, A4C:   7.30 cm LV major s, A4C:   5.81 cm LV vol d,  MOD A4C: 52.4 ml LV vol s, MOD A4C: 14.8 ml LV SV MOD A4C:     52.4 ml AORTIC VALVE AV Area (Vmax):    2.77 cm AV Area (Vmean):   2.48 cm AV Area (VTI):     3.01 cm AV Vmax:           91.60 cm/s AV Vmean:          69.800 cm/s AV VTI:             0.137 m AV Peak Grad:      3.4 mmHg AV Mean Grad:      2.0 mmHg LVOT Vmax:         51.70 cm/s LVOT Vmean:        35.200 cm/s LVOT VTI:          0.084 m LVOT/AV VTI ratio: 0.61 MV E velocity: 39.80 cm/s 103 cm/s  TRICUSPID VALVE MV A velocity: 49.00 cm/s 70.3 cm/s TR Peak grad:   10.4 mmHg MV E/A ratio:  0.81       1.5       TR Vmax:        161.00 cm/s                                      SHUNTS                                     Systemic VTI:  0.08 m                                     Systemic Diam: 2.50 cm  Esteen Delpriore Prince Rome MD Electronically signed by Yolonda Kida MD Signature Date/Time: 11/24/2019/11:08:46 AM    Final      Echo Reasonable LVF/ Severely reduced RVF2  TELEMETRY: NSR nsstw:  ASSESSMENT AND PLAN:  Active Problems:   Cardiac arrest [facility])   STEMI involving right coronary artery ([place])    1. S/P STEMI post op day x 1 S/P cardiac arrest Resp Failure AMS Hypotension CAD Substance abuse Cocaine tox ARI/CRI Multisystem organ failure . Plan Agree with critical care management Continue pressers for Bp support Maintain vent for resp failure Agree with Cooling protocol Maintain asa and brilinta S/P PCI and stent to RCA with DES F/U cardiac enzymes and EKG Recommend neurology f/u for AMS Agree with broad spectrum antibx Continue critical care         Yolonda Kida, MD 11/24/2019 9:17 PM

## 2019-11-24 NOTE — Progress Notes (Signed)
Have called and updated pt's niece Shelva Majestic (only contact listed in computer) on her uncle's condition.  She informed me that she, along with the pt's son who is in the [place] in [place], are involved in decision making for the pt.   Updated her that he is critically ill with multiorgan failure following his STEMI and Cardiac arrest, and that he could possibly require Dialysis in the near future.  All questions answered.  She is very appreciative of the update, and she is considering visiting in person.  She will update pt's son.    [name], AGACNP-BC [facility] Pager: [phone]

## 2019-11-24 NOTE — Progress Notes (Signed)
Pharmacy Antibiotic Note  Kurt [name] is a 63 y.o. male admitted on 11/23/2019 with aspiration PNA .  Pharmacy has been consulted for Zosyn dosing.  Plan: Day 2 of abx Zosyn 3.375g IV q8h (4 hour infusion).  Height: 5\' 9"  (175.3 cm) Weight: 196 lb 6.9 oz (89.1 kg) IBW/kg (Calculated) : 70.7  Temp (24hrs), Avg:96.7 F (35.9 C), Min:95.5 F (35.3 C), Max:97.7 F (36.5 C)  Recent Labs  Lab 11/23/19 1627 11/23/19 2314 11/24/19 0051 11/24/19 0242 11/24/19 0838  WBC 20.6* 28.8* 28.0*  --   --   CREATININE 1.39* 1.46* 1.40*  --  1.44*  LATICACIDVEN  --  1.9  --  2.8*  --     Estimated Creatinine Clearance: 59.5 mL/min (A) (by C-G formula based on SCr of 1.44 mg/dL (H)).    No Known Allergies  Microbiology results:  12/25 BCx: pending  12/24 UCx:  pending  12/24 MRSA PCR: negative  Thank you for allowing pharmacy to be a part of this patient's care.  Oswald Hillock 11/24/2019 3:02 PM

## 2019-11-25 LAB — URINE CULTURE
Culture: NO GROWTH
Special Requests: NORMAL

## 2019-11-25 LAB — CBC WITH DIFFERENTIAL/PLATELET
Abs Immature Granulocytes: 0.15 10*3/uL — ABNORMAL HIGH (ref 0.00–0.07)
Basophils Absolute: 0 10*3/uL (ref 0.0–0.1)
Basophils Relative: 0 %
Eosinophils Absolute: 0 10*3/uL (ref 0.0–0.5)
Eosinophils Relative: 0 %
HCT: 37.5 % — ABNORMAL LOW (ref 39.0–52.0)
Hemoglobin: 12.7 g/dL — ABNORMAL LOW (ref 13.0–17.0)
Immature Granulocytes: 1 %
Lymphocytes Relative: 6 %
Lymphs Abs: 1.1 10*3/uL (ref 0.7–4.0)
MCH: 33.2 pg (ref 26.0–34.0)
MCHC: 33.9 g/dL (ref 30.0–36.0)
MCV: 98.2 fL (ref 80.0–100.0)
Monocytes Absolute: 1.5 10*3/uL — ABNORMAL HIGH (ref 0.1–1.0)
Monocytes Relative: 7 %
Neutro Abs: 17.3 10*3/uL — ABNORMAL HIGH (ref 1.7–7.7)
Neutrophils Relative %: 86 %
Platelets: 133 10*3/uL — ABNORMAL LOW (ref 150–400)
RBC: 3.82 MIL/uL — ABNORMAL LOW (ref 4.22–5.81)
RDW: 13.2 % (ref 11.5–15.5)
WBC: 20.1 10*3/uL — ABNORMAL HIGH (ref 4.0–10.5)
nRBC: 0 % (ref 0.0–0.2)

## 2019-11-25 LAB — GLUCOSE, CAPILLARY
Glucose-Capillary: 106 mg/dL — ABNORMAL HIGH (ref 70–99)
Glucose-Capillary: 121 mg/dL — ABNORMAL HIGH (ref 70–99)
Glucose-Capillary: 129 mg/dL — ABNORMAL HIGH (ref 70–99)
Glucose-Capillary: 136 mg/dL — ABNORMAL HIGH (ref 70–99)
Glucose-Capillary: 76 mg/dL (ref 70–99)
Glucose-Capillary: 84 mg/dL (ref 70–99)

## 2019-11-25 LAB — COMPREHENSIVE METABOLIC PANEL
ALT: 65 U/L — ABNORMAL HIGH (ref 0–44)
AST: 167 U/L — ABNORMAL HIGH (ref 15–41)
Albumin: 2.6 g/dL — ABNORMAL LOW (ref 3.5–5.0)
Alkaline Phosphatase: 41 U/L (ref 38–126)
Anion gap: 15 (ref 5–15)
BUN: 28 mg/dL — ABNORMAL HIGH (ref 8–23)
CO2: 25 mmol/L (ref 22–32)
Calcium: 6.6 mg/dL — ABNORMAL LOW (ref 8.9–10.3)
Chloride: 103 mmol/L (ref 98–111)
Creatinine, Ser: 1.26 mg/dL — ABNORMAL HIGH (ref 0.61–1.24)
GFR calc Af Amer: >60 mL/min (ref >60)
GFR calc non Af Amer: >60 mL/min (ref >60)
Glucose, Bld: 150 mg/dL — ABNORMAL HIGH (ref 70–99)
Potassium: 3.2 mmol/L — ABNORMAL LOW (ref 3.5–5.1)
Sodium: 143 mmol/L (ref 135–145)
Total Bilirubin: 0.7 mg/dL (ref 0.3–1.2)
Total Protein: 4.9 g/dL — ABNORMAL LOW (ref 6.5–8.1)

## 2019-11-25 LAB — PROCALCITONIN: Procalcitonin: 1.64 ng/mL

## 2019-11-25 LAB — MAGNESIUM: Magnesium: 1.8 mg/dL (ref 1.7–2.4)

## 2019-11-25 MED ORDER — MAGNESIUM SULFATE 2 GM/50ML IV SOLN
2.0000 g | Freq: Once | INTRAVENOUS | Status: AC
  Administered 2019-11-25: 2 g via INTRAVENOUS
  Filled 2019-11-25: qty 50

## 2019-11-25 MED ORDER — POTASSIUM CHLORIDE 20 MEQ/15ML (10%) PO SOLN
30.0000 meq | ORAL | Status: AC
  Administered 2019-11-25 (×2): 30 meq
  Filled 2019-11-25 (×2): qty 30

## 2019-11-25 MED ORDER — POTASSIUM CHLORIDE IN NACL 20-0.9 MEQ/L-% IV SOLN
INTRAVENOUS | Status: DC
  Filled 2019-11-25 (×4): qty 1000

## 2019-11-25 NOTE — Progress Notes (Signed)
CRITICAL CARE NOTE  CC  follow up respiratory failure  SUBJECTIVE Patient remains critically ill Prognosis is guarded Remains on  Vent On vasopressors Multiorgan failure    BP (!) 111/56   Pulse 62   Temp 98.8 F (37.1 C) (Bladder)   Resp (!) 22   Ht 5\' 9"  (1.753 m)   Wt 93.7 kg   SpO2 98%   BMI 30.51 kg/m    I/O last 3 completed shifts: In: 6418.3 [I.V.:4839.3; IV [id number] Out: 2195 [id number]; Emesis/NG output:400] Total I/O In: -  Out: 200 [Urine:200]  SpO2: 98 % FiO2 (%): 40 %   SIGNIFICANT EVENTS 12/24-intubated/Vent support, cardiac arrest, s/p CATH-100% OCCLUSION OF RCA, COCAINE TOXICITY 12/24-HYPOTHERMIA PROTOCOL 12/24 - Critically ill, requiring 3 vasopressors, 100% FiO2 & 11 PEEP 12/25 remains on pressors, on vent, fio2 100% 12/26 remains on hypothermia protocol  REVIEW OF SYSTEMS  PATIENT IS UNABLE TO PROVIDE COMPLETE REVIEW OF SYSTEMS DUE TO SEVERE CRITICAL ILLNESS   PHYSICAL EXAMINATION:  GENERAL:critically ill appearing, +resp distress HEAD: Normocephalic, atraumatic.  EYES: Pupils equal, round, reactive to light.  No scleral icterus.  MOUTH: Moist mucosal membrane. NECK: Supple.  PULMONARY: +rhonchi, +wheezing CARDIOVASCULAR: S1 and S2. Regular rate and rhythm. No murmurs, rubs, or gallops.  GASTROINTESTINAL: Soft, nontender, -distended.  Positive bowel sounds.   MUSCULOSKELETAL: No swelling, clubbing, or edema.  NEUROLOGIC: obtunded, GCS<8 SKIN:intact,warm,dry  MEDICATIONS: I have reviewed all medications and confirmed regimen as documented   CULTURE RESULTS   Recent Results (from the past 240 hour(s))  Respiratory Panel by RT PCR (Flu A&B, Covid) - Nasopharyngeal Swab     Status: None   Collection Time: 11/23/19  4:24 PM   Specimen: Nasopharyngeal Swab  Result Value Ref Range Status   SARS Coronavirus 2 by RT PCR NEGATIVE NEGATIVE Final    Comment: (NOTE) SARS-CoV-2 target nucleic acids are NOT DETECTED. The  SARS-CoV-2 RNA is generally detectable in upper respiratoy specimens during the acute phase of infection. The lowest concentration of SARS-CoV-2 viral copies this assay can detect is 131 copies/mL. A negative result does not preclude SARS-Cov-2 infection and should not be used as the sole basis for treatment or other patient management decisions. A negative result may occur with  improper specimen collection/handling, submission of specimen other than nasopharyngeal swab, presence of viral mutation(s) within the areas targeted by this assay, and inadequate number of viral copies (<131 copies/mL). A negative result must be combined with clinical observations, patient history, and epidemiological information. The expected result is Negative. Fact Sheet for Patients:  PinkCheek.be Fact Sheet for Healthcare Providers:  GravelBags.it This test is not yet ap proved or cleared by the [place] FDA and  has been authorized for detection and/or diagnosis of SARS-CoV-2 by FDA under an Emergency Use Authorization (EUA). This EUA will remain  in effect (meaning this test can be used) for the duration of the COVID-19 declaration under Section 564(b)(1) of the Act, 21 U.S.C. section 360bbb-3(b)(1), unless the authorization is terminated or revoked sooner.    Influenza A by PCR NEGATIVE NEGATIVE Final   Influenza B by PCR NEGATIVE NEGATIVE Final    Comment: (NOTE) The Xpert Xpress SARS-CoV-2/FLU/RSV assay is intended as an aid in  the diagnosis of influenza from Nasopharyngeal swab specimens and  should not be used as a sole basis for treatment. Nasal washings and  aspirates are unacceptable for Xpert Xpress SARS-CoV-2/FLU/RSV  testing. Fact Sheet for Patients: PinkCheek.be Fact Sheet for Healthcare Providers: GravelBags.it This test is not  yet approved or cleared by the [place] and  has been authorized for detection and/or diagnosis of SARS-CoV-2 by  FDA under an Emergency Use Authorization (EUA). This EUA will remain  in effect (meaning this test can be used) for the duration of the  Covid-19 declaration under Section 564(b)(1) of the Act, 21  U.S.C. section 360bbb-3(b)(1), unless the authorization is  terminated or revoked. Performed at [facility], [address] [place], [place] [address]   MRSA PCR Screening     Status: None   Collection Time: 11/23/19  9:44 PM   Specimen: Nasopharyngeal  Result Value Ref Range Status   MRSA by PCR NEGATIVE NEGATIVE Final    Comment:        The GeneXpert MRSA Assay (FDA approved for NASAL specimens only), is one component of a comprehensive MRSA colonization surveillance program. It is not intended to diagnose MRSA infection nor to guide or monitor treatment for MRSA infections. Performed at [facility], [address] [place], [place] [address]   CULTURE, BLOOD (ROUTINE X 2) w Reflex to ID Panel     Status: None (Preliminary result)   Collection Time: 11/24/19  4:03 AM   Specimen: BLOOD  Result Value Ref Range Status   Specimen Description BLOOD LEFT UPPER ARM  Final   Special Requests   Final    BOTTLES DRAWN AEROBIC AND ANAEROBIC Blood Culture adequate volume   Culture   Final    NO GROWTH 1 DAY Performed at [facility], [address], [place], [place] [address]    Report Status PENDING  Incomplete  CULTURE, BLOOD (ROUTINE X 2) w Reflex to ID Panel     Status: None (Preliminary result)   Collection Time: 11/24/19  4:03 AM   Specimen: BLOOD  Result Value Ref Range Status   Specimen Description BLOOD BLOOD LEFT FOREARM  Final   Special Requests   Final    BOTTLES DRAWN AEROBIC ONLY Blood Culture results may not be optimal due to an inadequate volume of blood received in culture bottles   Culture   Final    NO GROWTH 1 DAY Performed at [facility],  [address], [place], [place] [address]    Report Status PENDING  Incomplete          IMAGING    ECHOCARDIOGRAM COMPLETE  Result Date: 11/24/2019   ECHOCARDIOGRAM REPORT   Patient Name:   DYON WAYCHOFF Date of Exam: 11/24/2019 Medical Rec #:  [id number]      Height:       69.0 in Accession #:    [id number]     Weight:       196.4 lb Date of Birth:  09/05/1956      BSA:          2.05 m Patient Age:    34 years       BP:           125/95 mmHg Patient Gender: M              HR:           52 bpm. Exam Location:  [facility] Procedure: 2D Echo and Intracardiac Opacification Agent Indications:     Cardiac Arrest  History:         Patient has no prior history of Echocardiogram examinations.                  CAD, COPD; Risk Factors:Cocaine Abuse.  Sonographer:     LTM Referring Phys:  [id number] [name] Diagnosing Phys: Yolonda Kida MD  Sonographer Comments: Echo performed with patient supine and on artificial respirator, no parasternal window and no apical window. Image acquisition challenging due to COPD. IMPRESSIONS  1. Left ventricular ejection fraction, by visual estimation, is 50 to 55%. The left ventricle has normal function. There is no left ventricular hypertrophy.  2. Definity contrast agent was given IV to delineate the left ventricular endocardial borders.  3. The left ventricle has no regional wall motion abnormalities.  4. Akinetic right ventricular.  5. Global right ventricle has severely reduced systolic function.The right ventricular size is moderately enlarged. No increase in right ventricular wall thickness.  6. Left atrial size was normal.  7. Right atrial size was normal.  8. The mitral valve is normal in structure. No evidence of mitral valve regurgitation.  9. The tricuspid valve is normal in structure. 10. The aortic valve is normal in structure. Aortic valve regurgitation is not visualized. 11. The pulmonic valve was not well visualized. Pulmonic valve regurgitation is not  visualized. 12. Normal pulmonary artery systolic pressure. 13. Left ventricular ejection fraction by PLAX is is 63 % FINDINGS  Left Ventricle: Left ventricular ejection fraction, by visual estimation, is 50 to 55%. Left ventricular ejection fraction by PLAX is is 63 % The left ventricle has normal function. Definity contrast agent was given IV to delineate the left ventricular endocardial borders. The left ventricle has no regional wall motion abnormalities. There is no left ventricular hypertrophy. Right Ventricle: The right ventricular size is moderately enlarged. No increase in right ventricular wall thickness. Global RV systolic function is has severely reduced systolic function. The tricuspid regurgitant velocity is 1.61 m/s, and with an assumed right atrial pressure of 10 mmHg, the estimated right ventricular systolic pressure is normal at 20.4 mmHg. The motion of the right ventricle akinetic. Left Atrium: Left atrial size was normal in size. Right Atrium: Right atrial size was normal in size Pericardium: There is no evidence of pericardial effusion. Mitral Valve: The mitral valve is normal in structure. No evidence of mitral valve regurgitation. Tricuspid Valve: The tricuspid valve is normal in structure. Tricuspid valve regurgitation is trivial. Aortic Valve: The aortic valve is normal in structure. Aortic valve regurgitation is not visualized. Aortic valve mean gradient measures 2.0 mmHg. Aortic valve peak gradient measures 3.4 mmHg. Aortic valve area, by VTI measures 3.01 cm. Pulmonic Valve: The pulmonic valve was not well visualized. Pulmonic valve regurgitation is not visualized. Pulmonic regurgitation is not visualized. Aorta: The aortic root is normal in size and structure. IAS/Shunts: No atrial level shunt detected by color flow Doppler.  LEFT VENTRICLE PLAX 2D LV EF:         Left ventricular ejection fraction by PLAX is is 63 % LVIDd:         5.23 cm LVIDs:         4.19 cm LV PW:         0.88 cm LV  IVS:        1.18 cm LVOT diam:     2.50 cm LV SV:         53 ml LV SV Index:   25.24 LVOT Area:     4.91 cm  LV Volumes (MOD) LV area d, A4C:    21.80 cm LV area s, A4C:    10.20 cm LV major d, A4C:   7.30 cm LV major s, A4C:  5.81 cm LV vol d, MOD A4C: 52.4 ml LV vol s, MOD A4C: 14.8 ml LV SV MOD A4C:     52.4 ml AORTIC VALVE AV Area (Vmax):    2.77 cm AV Area (Vmean):   2.48 cm AV Area (VTI):     3.01 cm AV Vmax:           91.60 cm/s AV Vmean:          69.800 cm/s AV VTI:            0.137 m AV Peak Grad:      3.4 mmHg AV Mean Grad:      2.0 mmHg LVOT Vmax:         51.70 cm/s LVOT Vmean:        35.200 cm/s LVOT VTI:          0.084 m LVOT/AV VTI ratio: 0.61 MV E velocity: 39.80 cm/s 103 cm/s  TRICUSPID VALVE MV A velocity: 49.00 cm/s 70.3 cm/s TR Peak grad:   10.4 mmHg MV E/A ratio:  0.81       1.5       TR Vmax:        161.00 cm/s                                      SHUNTS                                     Systemic VTI:  0.08 m                                     Systemic Diam: 2.50 cm  Dwayne D Callwood MD Electronically signed by Yolonda Kida MD Signature Date/Time: 11/24/2019/11:08:46 AM    Final        Indwelling Urinary Catheter continued, requirement due to   Reason to continue Indwelling Urinary Catheter strict Intake/Output monitoring for hemodynamic instability   Central Line/ continued, requirement due to  Reason to continue [place] of central venous pressure or other hemodynamic parameters and poor IV access   Ventilator continued, requirement due to severe respiratory failure   Ventilator Sedation RASS 0 to -2      ASSESSMENT AND PLAN SYNOPSIS   63 yo white male with acute and severe hypoxic respiratory failure from acute V fib cardiac arrest and sudden cardiac deathSTEMI from acute RCA OCCLUSION andfrom COCAINE Toxicity. Now with Cardiogenic shock requiring multiple vasopressors. Concern for possible aspiration pneumonia.      Severe  ACUTE Hypoxic and Hypercapnic Respiratory Failure -continue Full MV support -continue Bronchodilator Therapy -Wean Fio2 and PEEP as tolerated   ACUTE MI/ CARDIAC FAILURE-  -follow up cardiac enzymes as indicated -follow up cardiology recs CONTINUE HYPOTHERMIA PROTOCOL   ACUTE KIDNEY INJURY/Renal Failure -follow chem 7 -follow UO -continue Foley Catheter-assess need -Avoid nephrotoxic agents -Recheck creatinine    NEUROLOGY - intubated and sedated - minimal sedation to achieve a RASS goal: -1   SHOCK-SEPSIS/CARDIOGENIC -use vasopressors to keep MAP>65 -follow ABG and LA -follow up cultures -emperic ABX  CARDIAC ICU monitoring  ID -continue IV abx as prescibed -follow up cultures  GI GI PROPHYLAXIS as indicated  NUTRITIONAL STATUS DIET-->on hold Constipation protocol as indicated  ENDO - will use ICU hypoglycemic\Hyperglycemia protocol if indicated   ELECTROLYTES -follow labs as needed -replace as needed -pharmacy consultation and following   DVT/GI PRX ordered TRANSFUSIONS AS NEEDED MONITOR FSBS ASSESS the need for LABS as needed   Critical Care Time devoted to patient care services described in this note is 35 minutes.   Overall, patient is critically ill, prognosis is guarded.  Patient with Multiorgan failure and at high risk for cardiac arrest and death.     Corrin Parker, M.D.  [facility]  Medical Director [place] Director [facility] Cardio-P[facility] Department

## 2019-11-25 NOTE — Progress Notes (Signed)
Pharmacy Antibiotic Note  Kurt [name] is a 63 y.o. male admitted on 11/23/2019 with aspiration PNA .  Pharmacy has been consulted for Zosyn dosing. -on Vent  Plan: Day 3 of abx Zosyn 3.375g IV q8h (4 hour infusion).  Height: 5\' 9"  (175.3 cm) Weight: 206 lb 9.1 oz (93.7 kg) IBW/kg (Calculated) : 70.7  Temp (24hrs), Avg:97.6 F (36.4 C), Min:96.4 F (35.8 C), Max:98.8 F (37.1 C)  Recent Labs  Lab 11/23/19 1627 11/23/19 2314 11/24/19 0051 11/24/19 0242 11/24/19 0838 11/25/19 0418  WBC 20.6* 28.8* 28.0*  --   --  20.1*  CREATININE 1.39* 1.46* 1.40*  --  1.44* 1.26*  LATICACIDVEN  --  1.9  --  2.8*  --   --     Estimated Creatinine Clearance: 69.6 mL/min (A) (by C-G formula based on SCr of 1.26 mg/dL (H)).    No Known Allergies  Microbiology results:  12/25 BCx: NGx1d  12/24 UCx: NG  12/24 MRSA PCR: negative 12/26 trach aspirate: rare GPC  Thank you for allowing pharmacy to be a part of this patient's care.  Shimshon Narula A 11/25/2019 1:12 PM

## 2019-11-25 NOTE — Progress Notes (Signed)
[facility]  ROUNDING NOTE   Subjective:   UOP 1052m.   Currently with cooling protocol.   Objective:  Vital signs in last 24 hours:  Temp:  [95.5 F (35.3 C)-98.8 F (37.1 C)] 98.8 F (37.1 C) (12/26 0829) Pulse Rate:  [53-77] 62 (12/26 0829) Resp:  [9-25] 22 (12/26 0829) BP: (91-116)/(51-73) 111/56 (12/26 0800) SpO2:  [93 %-100 %] 98 % (12/26 0829) Arterial Line BP: (103-129)/(51-107) 125/62 (12/26 0829) FiO2 (%):  [40 %-60 %] 40 % (12/26 0735) Weight:  [93.7 kg] 93.7 kg (12/26 0500)  Weight change: 7.517 kg Filed Weights   11/23/19 2100 11/24/19 0500 11/25/19 0500  Weight: 82.9 kg 89.1 kg 93.7 kg    Intake/Output: I/O last 3 completed shifts: In: 6418.3 [I.V.:4839.3; IV Piggyback:1579] Out: 2195 [[XBDZH:2992 Emesis/NG output:400]   Intake/Output this shift:  Total I/O In: -  Out: 200 [Urine:200]  Physical Exam: General: Critically ill  Head: ETT  Eyes: Anicteric, PERRL  Neck:   trachea midline  Lungs:  PRVC FiO2 40%  Heart: Regular rate and rhythm  Abdomen:  Soft, nontender,   Extremities:  + peripheral edema.  Neurologic: Intubated and sedated  Skin: No lesions        Basic Metabolic Panel: Recent Labs  Lab 11/23/19 1624 11/23/19 1627 11/23/19 2314 11/24/19 0051 11/24/19 0838 11/25/19 0418  NA  --  141 141 142 143 143  K  --  2.8* 5.9* 5.8* 4.2 3.2*  CL  --  105 112* 113* 110 103  CO2  --  23 23 20* 24 25  GLUCOSE  --  332* 164* 207* 140* 150*  BUN  --  23 27* 28* 34* 28*  CREATININE  --  1.39* 1.46* 1.40* 1.44* 1.26*  CALCIUM  --  8.1* 6.7* 6.1* 6.9* 6.6*  MG 2.3  --   --   --   --  1.8    Liver Function Tests: Recent Labs  Lab 11/23/19 1627 11/24/19 0051 11/25/19 0418  AST 65* 143* 167*  ALT 42 56* 65*  ALKPHOS 95 53 41  BILITOT 0.7 0.7 0.7  PROT 6.6 5.3* 4.9*  ALBUMIN 3.3* 2.7* 2.6*   No results for input(s): LIPASE, AMYLASE in the last 168 hours. No results for input(s): AMMONIA in the last 168  hours.  CBC: Recent Labs  Lab 11/23/19 1627 11/23/19 2314 11/24/19 0051 11/25/19 0418  WBC 20.6* 28.8* 28.0* 20.1*  NEUTROABS 13.4*  --   --  17.3*  HGB 16.8 15.1 15.6 12.7*  HCT 50.1 48.3 47.4 37.5*  MCV 100.0 105.0* 101.5* 98.2  PLT 195 200 207 133*    Cardiac Enzymes: No results for input(s): CKTOTAL, CKMB, CKMBINDEX, TROPONINI in the last 168 hours.  BNP: Invalid input(s): POCBNP  CBG: Recent Labs  Lab 11/24/19 1749 11/24/19 2048 11/24/19 2320 11/25/19 0340 11/25/19 0830  GLUCAP 79 73 78 121* 106*    Microbiology: Results for orders placed or performed during the hospital encounter of 11/23/19  Respiratory Panel by RT PCR (Flu A&B, Covid) - Nasopharyngeal Swab     Status: None   Collection Time: 11/23/19  4:24 PM   Specimen: Nasopharyngeal Swab  Result Value Ref Range Status   SARS Coronavirus 2 by RT PCR NEGATIVE NEGATIVE Final    Comment: (NOTE) SARS-CoV-2 target nucleic acids are NOT DETECTED. The SARS-CoV-2 RNA is generally detectable in upper respiratoy specimens during the acute phase of infection. The lowest concentration of SARS-CoV-2 viral copies this assay can detect  is 131 copies/mL. A negative result does not preclude SARS-Cov-2 infection and should not be used as the sole basis for treatment or other patient management decisions. A negative result may occur with  improper specimen collection/handling, submission of specimen other than nasopharyngeal swab, presence of viral mutation(s) within the areas targeted by this assay, and inadequate number of viral copies (<131 copies/mL). A negative result must be combined with clinical observations, patient history, and epidemiological information. The expected result is Negative. Fact Sheet for Patients:  PinkCheek.be Fact Sheet for Healthcare Providers:  GravelBags.it This test is not yet ap proved or cleared by the [place] FDA and   has been authorized for detection and/or diagnosis of SARS-CoV-2 by FDA under an Emergency Use Authorization (EUA). This EUA will remain  in effect (meaning this test can be used) for the duration of the COVID-19 declaration under Section 564(b)(1) of the Act, 21 U.S.C. section 360bbb-3(b)(1), unless the authorization is terminated or revoked sooner.    Influenza A by PCR NEGATIVE NEGATIVE Final   Influenza B by PCR NEGATIVE NEGATIVE Final    Comment: (NOTE) The Xpert Xpress SARS-CoV-2/FLU/RSV assay is intended as an aid in  the diagnosis of influenza from Nasopharyngeal swab specimens and  should not be used as a sole basis for treatment. Nasal washings and  aspirates are unacceptable for Xpert Xpress SARS-CoV-2/FLU/RSV  testing. Fact Sheet for Patients: PinkCheek.be Fact Sheet for Healthcare Providers: GravelBags.it This test is not yet approved or cleared by the [place] FDA and  has been authorized for detection and/or diagnosis of SARS-CoV-2 by  FDA under an Emergency Use Authorization (EUA). This EUA will remain  in effect (meaning this test can be used) for the duration of the  Covid-19 declaration under Section 564(b)(1) of the Act, 21  U.S.C. section 360bbb-3(b)(1), unless the authorization is  terminated or revoked. Performed at [facility], [address] [place], [place] [address]   MRSA PCR Screening     Status: None   Collection Time: 11/23/19  9:44 PM   Specimen: Nasopharyngeal  Result Value Ref Range Status   MRSA by PCR NEGATIVE NEGATIVE Final    Comment:        The GeneXpert MRSA Assay (FDA approved for NASAL specimens only), is one component of a comprehensive MRSA colonization surveillance program. It is not intended to diagnose MRSA infection nor to guide or monitor treatment for MRSA infections. Performed at [facility], [address] [place], [place] [address]    CULTURE, BLOOD (ROUTINE X 2) w Reflex to ID Panel     Status: None (Preliminary result)   Collection Time: 11/24/19  4:03 AM   Specimen: BLOOD  Result Value Ref Range Status   Specimen Description BLOOD LEFT UPPER ARM  Final   Special Requests   Final    BOTTLES DRAWN AEROBIC AND ANAEROBIC Blood Culture adequate volume   Culture   Final    NO GROWTH 1 DAY Performed at [facility], [address] [place], [place] [address]    Report Status PENDING  Incomplete  CULTURE, BLOOD (ROUTINE X 2) w Reflex to ID Panel     Status: None (Preliminary result)   Collection Time: 11/24/19  4:03 AM   Specimen: BLOOD  Result Value Ref Range Status   Specimen Description BLOOD BLOOD LEFT FOREARM  Final   Special Requests   Final    BOTTLES DRAWN AEROBIC ONLY Blood Culture results may not be optimal due to  an inadequate volume of blood received in culture bottles   Culture   Final    NO GROWTH 1 DAY Performed at [facility], [address] [place], [place] [address]    Report Status PENDING  Incomplete    Coagulation Studies: Recent Labs    11/23/19 1627 11/23/19 2314  LABPROT 15.8* 18.0*  INR 1.3* 1.5*    Urinalysis: Recent Labs    11/23/19 1627  COLORURINE YELLOW*  LABSPEC 1.010  PHURINE 7.0  GLUCOSEU >=500*  HGBUR MODERATE*  BILIRUBINUR NEGATIVE  KETONESUR NEGATIVE  PROTEINUR 100*  NITRITE NEGATIVE  LEUKOCYTESUR NEGATIVE      Imaging: DG Chest 1 View  Result Date: 11/23/2019 CLINICAL DATA:  Intubation. Cardiac arrest. EXAM: CHEST  1 VIEW COMPARISON:  03/16/2018 FINDINGS: Endotracheal tube is in good position 3 cm above the carina. Heart size and pulmonary vascularity are normal. Slight haziness in the mid and upper lung zones may represent mild pulmonary edema. No effusions. No pneumothorax. IMPRESSION: 1. Endotracheal tube in good position. 2. Hazy infiltrates in the mid and upper lung zones may represent mild pulmonary edema. Aspiration pneumonitis  could also give this appearance. Electronically Signed   By: Lorriane Shire M.D.   On: 11/23/2019 17:24   DG Abd 1 View  Result Date: 11/23/2019 CLINICAL DATA:  Central line and OG placement EXAM: ABDOMEN - 1 VIEW COMPARISON:  Same day chest radiograph FINDINGS: Pacer pads overlie the lower chest. Telemetry leads in monitoring devices also project over the upper abdomen and lower chest. The tip of a transesophageal tube terminates near the level of the gastric antrum with the side port beyond the GE junction. Hazy opacities in the lungs are better seen on dedicated chest radiograph performed concurrently. Coarse radiodensities projecting over both renal shadows worrisome for urolithiasis if this patient has not recently received contrast media. Degenerative changes present the spine. No high-grade obstructive bowel gas pattern. IMPRESSION: 1. Transesophageal tube tip is near the level of the gastric antrum with the side port beyond the GE junction. 2. No high-grade obstructive bowel gas pattern. 3. Coarse density over both renal shadows could reflect urolithiasis if this patient has not recently received contrast media. Electronically Signed   By: Lovena Le M.D.   On: 11/23/2019 21:51   CT Head Wo Contrast  Result Date: 11/23/2019 CLINICAL DATA:  Head trauma. Cardiac arrest. EXAM: CT HEAD WITHOUT CONTRAST CT CERVICAL SPINE WITHOUT CONTRAST TECHNIQUE: Multidetector CT imaging of the head and cervical spine was performed following the standard protocol without intravenous contrast. Multiplanar CT image reconstructions of the cervical spine were also generated. COMPARISON:  None. FINDINGS: CT HEAD FINDINGS Brain: No evidence of acute infarction, hemorrhage, hydrocephalus, extra-axial collection or mass lesion/mass effect. Vascular: No hyperdense vessel or unexpected calcification. Skull: Normal. Negative for fracture or focal lesion. Sinuses/Orbits: No acute finding. Evidence of previous maxillary sinus  surgery. Slight mucosal thickening in the ethmoid air cells. Other: None CT CERVICAL SPINE FINDINGS Alignment: Normal. Skull base and vertebrae: No acute fracture. No primary bone lesion or focal pathologic process. Soft tissues and spinal canal: No prevertebral fluid or swelling. No visible canal hematoma. Disc levels: There are no disc protrusions or significant disc bulges in the cervical spine. There is no spinal stenosis. There is moderate left foraminal stenosis at C6-7 due to facet arthritis. The neural foramina throughout the remainder of the cervical spine are widely patent. Moderate right facet arthritis at C3-4. Upper chest: Focal area of infiltrate at the right  lung apex posteriorly. Patchy haziness in both lung apices. Endotracheal tube in place. Other: None IMPRESSION: 1. Normal CT scan of the head. 2. No significant abnormality of the cervical spine. Moderate left foraminal stenosis at C6-7. Electronically Signed   By: Lorriane Shire M.D.   On: 11/23/2019 17:30   CT Cervical Spine Wo Contrast  Result Date: 11/23/2019 CLINICAL DATA:  Head trauma. Cardiac arrest. EXAM: CT HEAD WITHOUT CONTRAST CT CERVICAL SPINE WITHOUT CONTRAST TECHNIQUE: Multidetector CT imaging of the head and cervical spine was performed following the standard protocol without intravenous contrast. Multiplanar CT image reconstructions of the cervical spine were also generated. COMPARISON:  None. FINDINGS: CT HEAD FINDINGS Brain: No evidence of acute infarction, hemorrhage, hydrocephalus, extra-axial collection or mass lesion/mass effect. Vascular: No hyperdense vessel or unexpected calcification. Skull: Normal. Negative for fracture or focal lesion. Sinuses/Orbits: No acute finding. Evidence of previous maxillary sinus surgery. Slight mucosal thickening in the ethmoid air cells. Other: None CT CERVICAL SPINE FINDINGS Alignment: Normal. Skull base and vertebrae: No acute fracture. No primary bone lesion or focal pathologic  process. Soft tissues and spinal canal: No prevertebral fluid or swelling. No visible canal hematoma. Disc levels: There are no disc protrusions or significant disc bulges in the cervical spine. There is no spinal stenosis. There is moderate left foraminal stenosis at C6-7 due to facet arthritis. The neural foramina throughout the remainder of the cervical spine are widely patent. Moderate right facet arthritis at C3-4. Upper chest: Focal area of infiltrate at the right lung apex posteriorly. Patchy haziness in both lung apices. Endotracheal tube in place. Other: None IMPRESSION: 1. Normal CT scan of the head. 2. No significant abnormality of the cervical spine. Moderate left foraminal stenosis at C6-7. Electronically Signed   By: Lorriane Shire M.D.   On: 11/23/2019 17:30   CARDIAC CATHETERIZATION  Result Date: 11/23/2019  Prox LAD lesion is 50% stenosed.  Prox RCA lesion is 100% stenosed. Infarct-related artery TIMI 0 flow  Dist Cx lesion is 90% stenosed.  Normal left ventricular function ejection fraction between 50 and 55%  Conclusion STEMI presentation 100% proximal RCA TIMI 0 flow Successful PCI and stent with DES 3.0 x 26 mm resolute Onyx to 13 atm to proximal RCA restoring TIMI-3 flow Left main free of disease LAD large with a proximal 50% lesion Circumflex very large with a distal 90% distal circumflex lesion Mixed dominant system Preserved left ventricular function of 50 to 55% Circumflex lesion intervention was deferred   DG Chest Port 1 View  Result Date: 11/23/2019 CLINICAL DATA:  Central line placement and OG placement EXAM: PORTABLE CHEST 1 VIEW COMPARISON:  Radiograph 11/23/2019 FINDINGS: Endotracheal tube terminates in the mid trachea, 3 cm from the carina. A transesophageal tube tip and side port distal to the GE junction, terminating about the level of the gastric antrum. A right IJ catheter tip is present in the mid to upper SVC. Overlying pacer pads are noted. Telemetry wires  project over lower chest as well. Hazy opacities in the mid to upper lungs are similar to prior. No pneumothorax. No effusion. The cardiomediastinal contours are unremarkable. Coarse calcifications noted upper pole left kidney. IMPRESSION: 1. Endotracheal tube 3 cm from the carina. 2. A transesophageal tube tip and side port are distal to the GE junction. 3. Right IJ catheter tip in the mid to upper SVC. 4. Stable hazy opacities in the mid to upper lungs bilaterally. 5. Left upper pole renal calcification. Electronically Signed   By: March Rummage  [facility] M.D.   On: 11/23/2019 21:48   ECHOCARDIOGRAM COMPLETE  Result Date: 11/24/2019   ECHOCARDIOGRAM REPORT   Patient Name:   HOANG PETTINGILL Date of Exam: 11/24/2019 Medical Rec #:  [id number]      Height:       69.0 in Accession #:    [id number]     Weight:       196.4 lb Date of Birth:  [birth year]      BSA:          2.05 m Patient Age:    10 years       BP:           125/95 mmHg Patient Gender: M              HR:           52 bpm. Exam Location:  [facility] Procedure: 2D Echo and Intracardiac Opacification Agent Indications:     Cardiac Arrest  History:         Patient has no prior history of Echocardiogram examinations.                  CAD, COPD; Risk Factors:Cocaine Abuse.  Sonographer:     LTM Referring Phys:  [id number] [name] Diagnosing Phys: Yolonda Kida MD  Sonographer Comments: Echo performed with patient supine and on artificial respirator, no parasternal window and no apical window. Image acquisition challenging due to COPD. IMPRESSIONS  1. Left ventricular ejection fraction, by visual estimation, is 50 to 55%. The left ventricle has normal function. There is no left ventricular hypertrophy.  2. Definity contrast agent was given IV to delineate the left ventricular endocardial borders.  3. The left ventricle has no regional wall motion abnormalities.  4. Akinetic right ventricular.  5. Global right ventricle has severely reduced systolic function.The right  ventricular size is moderately enlarged. No increase in right ventricular wall thickness.  6. Left atrial size was normal.  7. Right atrial size was normal.  8. The mitral valve is normal in structure. No evidence of mitral valve regurgitation.  9. The tricuspid valve is normal in structure. 10. The aortic valve is normal in structure. Aortic valve regurgitation is not visualized. 11. The pulmonic valve was not well visualized. Pulmonic valve regurgitation is not visualized. 12. Normal pulmonary artery systolic pressure. 13. Left ventricular ejection fraction by PLAX is is 63 % FINDINGS  Left Ventricle: Left ventricular ejection fraction, by visual estimation, is 50 to 55%. Left ventricular ejection fraction by PLAX is is 63 % The left ventricle has normal function. Definity contrast agent was given IV to delineate the left ventricular endocardial borders. The left ventricle has no regional wall motion abnormalities. There is no left ventricular hypertrophy. Right Ventricle: The right ventricular size is moderately enlarged. No increase in right ventricular wall thickness. Global RV systolic function is has severely reduced systolic function. The tricuspid regurgitant velocity is 1.61 m/s, and with an assumed right atrial pressure of 10 mmHg, the estimated right ventricular systolic pressure is normal at 20.4 mmHg. The motion of the right ventricle akinetic. Left Atrium: Left atrial size was normal in size. Right Atrium: Right atrial size was normal in size Pericardium: There is no evidence of pericardial effusion. Mitral Valve: The mitral valve is normal in structure. No evidence of mitral valve regurgitation. Tricuspid Valve: The tricuspid valve is normal in structure. Tricuspid valve regurgitation is trivial. Aortic Valve: The aortic valve is normal in structure. Aortic valve regurgitation  is not visualized. Aortic valve mean gradient measures 2.0 mmHg. Aortic valve peak gradient measures 3.4 mmHg. Aortic valve  area, by VTI measures 3.01 cm. Pulmonic Valve: The pulmonic valve was not well visualized. Pulmonic valve regurgitation is not visualized. Pulmonic regurgitation is not visualized. Aorta: The aortic root is normal in size and structure. IAS/Shunts: No atrial level shunt detected by color flow Doppler.  LEFT VENTRICLE PLAX 2D LV EF:         Left ventricular ejection fraction by PLAX is is 63 % LVIDd:         5.23 cm LVIDs:         4.19 cm LV PW:         0.88 cm LV IVS:        1.18 cm LVOT diam:     2.50 cm LV SV:         53 ml LV SV Index:   25.24 LVOT Area:     4.91 cm  LV Volumes (MOD) LV area d, A4C:    21.80 cm LV area s, A4C:    10.20 cm LV major d, A4C:   7.30 cm LV major s, A4C:   5.81 cm LV vol d, MOD A4C: 52.4 ml LV vol s, MOD A4C: 14.8 ml LV SV MOD A4C:     52.4 ml AORTIC VALVE AV Area (Vmax):    2.77 cm AV Area (Vmean):   2.48 cm AV Area (VTI):     3.01 cm AV Vmax:           91.60 cm/s AV Vmean:          69.800 cm/s AV VTI:            0.137 m AV Peak Grad:      3.4 mmHg AV Mean Grad:      2.0 mmHg LVOT Vmax:         51.70 cm/s LVOT Vmean:        35.200 cm/s LVOT VTI:          0.084 m LVOT/AV VTI ratio: 0.61 MV E velocity: 39.80 cm/s 103 cm/s  TRICUSPID VALVE MV A velocity: 49.00 cm/s 70.3 cm/s TR Peak grad:   10.4 mmHg MV E/A ratio:  0.81       1.5       TR Vmax:        161.00 cm/s                                      SHUNTS                                     Systemic VTI:  0.08 m                                     Systemic Diam: 2.50 cm  Yolonda Kida MD Electronically signed by Yolonda Kida MD Signature Date/Time: 11/24/2019/11:08:46 AM    Final      Medications:   . sodium chloride    . sodium chloride    . sodium chloride    . sodium chloride    . sodium chloride    . sodium chloride    . amiodarone Stopped (11/24/19  3567)  . famotidine (PEPCID) IV Stopped (11/24/19 2238)  . fentaNYL infusion INTRAVENOUS 150 mcg/hr (11/25/19 0300)  . magnesium sulfate bolus IVPB    .  norepinephrine (LEVOPHED) Adult infusion 15 mcg/min (11/25/19 0300)  . phenylephrine (NEO-SYNEPHRINE) Adult infusion Stopped (11/24/19 0444)  . piperacillin-tazobactam (ZOSYN)  IV 3.375 g (11/25/19 0553)  . propofol (DIPRIVAN) infusion 25 mcg/kg/min (11/25/19 0325)  .  sodium bicarbonate (isotonic) infusion in sterile water 125 mL/hr at 11/25/19 0324  . vasopressin (PITRESSIN) infusion - *FOR SHOCK* Stopped (11/24/19 0420)   . aspirin  81 mg Oral Pre-Cath  . aspirin  81 mg Per Tube Daily  . chlorhexidine gluconate (MEDLINE KIT)  15 mL Mouth Rinse BID  . Chlorhexidine Gluconate Cloth  6 each Topical Daily  . fentaNYL (SUBLIMAZE) injection  50 mcg Intravenous Once  . heparin  5,000 Units Subcutaneous Q8H  . hydrocortisone sod succinate (SOLU-CORTEF) inj  50 mg Intravenous Q6H  . insulin aspart  0-15 Units Subcutaneous Q4H  . ipratropium-albuterol  3 mL Nebulization Q4H  . mouth rinse  15 mL Mouth Rinse 10 times per day  . potassium chloride  30 mEq Per Tube Q4H  . rosuvastatin  40 mg Oral q1800  . sodium chloride flush  3 mL Intravenous Q12H  . sodium chloride flush  3 mL Intravenous Q12H  . sodium chloride flush  3 mL Intravenous Q12H  . ticagrelor  90 mg Per Tube BID   sodium chloride, sodium chloride, sodium chloride, acetaminophen, acetaminophen, albuterol, fentaNYL, midazolam, ondansetron (ZOFRAN) IV, sodium chloride flush, sodium chloride flush, sodium chloride flush, vecuronium  Assessment/ Plan:  Mr. Kurt [name] is a 63 y.o. white male with COPD, hypertension,  , who was admitted to [facility] on 11/23/2019 for STEMI  1. Acute renal failure with metabolic acidosis and hyperkalemia: baseline creatinine of 1.02, normal GFR on 03/16/2018.  Nonoliguric urine output Urine with glycosuria, hematuria and proteinuria. Cardiac catheterization with IV dye exposure on 12/24.  - holding lisinoporil - Discontinued Lokelma - Bicarb infusion - change to Normal Saline with potassium.   2.  Hypotension with cardiogenic shock: on cooling protocol.  Echocardiogram reviewed.  - requiring vasopressors: norepinephrine and vasopressin - appreciate cardiology input   LOS: 2 Vinaya Sancho 12/26/20209:36 AM

## 2019-11-25 NOTE — Progress Notes (Signed)
[facility]    SUBJECTIVE: Intubated sedated   Vitals:   11/25/19 1127 11/25/19 1130 11/25/19 1200 11/25/19 1230  BP:  (!) 93/55 106/60 (!) 106/59  Pulse: 63 64 64 66  Resp: (!) 22 (!) 22 (!) 22 (!) 22  Temp:      TempSrc:      SpO2: 95% 94% 97% 97%  Weight:      Height:         Intake/Output Summary (Last 24 hours) at 11/25/2019 1232 Last data filed at 11/25/2019 1136 Gross per 24 hour  Intake 5523.87 ml  Output 1220 ml  Net 4303.87 ml      PHYSICAL EXAM  General: Well developed, well nourished, in no acute distress HEENT:  Normocephalic and atramatic Neck:  No JVD.  Lungs: Clear bilaterally to auscultation and percussion. Heart: HRRR . Normal S1 and S2 without gallops or murmurs.  Abdomen: Bowel sounds are positive, abdomen soft and non-tender  Msk:  Back normal, normal gait. Normal strength and tone for age. Extremities: No clubbing, cyanosis or edema.   Neuro: Alert and oriented X 3. Psych:  Good affect, responds appropriately   LABS: Basic Metabolic Panel: Recent Labs    11/23/19 1624 11/24/19 0838 11/25/19 0418  NA  --  143 143  K  --  4.2 3.2*  CL  --  110 103  CO2  --  24 25  GLUCOSE  --  140* 150*  BUN  --  34* 28*  CREATININE  --  1.44* 1.26*  CALCIUM  --  6.9* 6.6*  MG 2.3  --  1.8   Liver Function Tests: Recent Labs    11/24/19 0051 11/25/19 0418  AST 143* 167*  ALT 56* 65*  ALKPHOS 53 41  BILITOT 0.7 0.7  PROT 5.3* 4.9*  ALBUMIN 2.7* 2.6*   No results for input(s): LIPASE, AMYLASE in the last 72 hours. CBC: Recent Labs    11/23/19 1627 11/24/19 0051 11/25/19 0418  WBC 20.6* 28.0* 20.1*  NEUTROABS 13.4*  --  17.3*  HGB 16.8 15.6 12.7*  HCT 50.1 47.4 37.5*  MCV 100.0 101.5* 98.2  PLT 195 207 133*   Cardiac Enzymes: No results for input(s): CKTOTAL, CKMB, CKMBINDEX, TROPONINI in the last 72 hours. BNP: Invalid input(s): POCBNP D-Dimer: No results for input(s): DDIMER in the last 72 hours. Hemoglobin A1C: Recent  Labs    11/23/19 1627  HGBA1C 4.8   Fasting Lipid Panel: Recent Labs    11/23/19 1627 11/23/19 2314  CHOL 157  --   HDL 43  --   LDLCALC 92  --   TRIG 110 233*  CHOLHDL 3.7  --    Thyroid Function Tests: No results for input(s): TSH, T4TOTAL, T3FREE, THYROIDAB in the last 72 hours.  Invalid input(s): FREET3 Anemia Panel: No results for input(s): VITAMINB12, FOLATE, FERRITIN, TIBC, IRON, RETICCTPCT in the last 72 hours.  DG Chest 1 View  Result Date: 11/23/2019 CLINICAL DATA:  Intubation. Cardiac arrest. EXAM: CHEST  1 VIEW COMPARISON:  03/16/2018 FINDINGS: Endotracheal tube is in good position 3 cm above the carina. Heart size and pulmonary vascularity are normal. Slight haziness in the mid and upper lung zones may represent mild pulmonary edema. No effusions. No pneumothorax. IMPRESSION: 1. Endotracheal tube in good position. 2. Hazy infiltrates in the mid and upper lung zones may represent mild pulmonary edema. Aspiration pneumonitis could also give this appearance. Electronically Signed   By: Lorriane Shire M.D.   On: 11/23/2019  17:24   DG Abd 1 View  Result Date: 11/23/2019 CLINICAL DATA:  Central line and OG placement EXAM: ABDOMEN - 1 VIEW COMPARISON:  Same day chest radiograph FINDINGS: Pacer pads overlie the lower chest. Telemetry leads in monitoring devices also project over the upper abdomen and lower chest. The tip of a transesophageal tube terminates near the level of the gastric antrum with the side port beyond the GE junction. Hazy opacities in the lungs are better seen on dedicated chest radiograph performed concurrently. Coarse radiodensities projecting over both renal shadows worrisome for urolithiasis if this patient has not recently received contrast media. Degenerative changes present the spine. No high-grade obstructive bowel gas pattern. IMPRESSION: 1. Transesophageal tube tip is near the level of the gastric antrum with the side port beyond the GE junction. 2. No  high-grade obstructive bowel gas pattern. 3. Coarse density over both renal shadows could reflect urolithiasis if this patient has not recently received contrast media. Electronically Signed   By: Lovena Le M.D.   On: 11/23/2019 21:51   CT Head Wo Contrast  Result Date: 11/23/2019 CLINICAL DATA:  Head trauma. Cardiac arrest. EXAM: CT HEAD WITHOUT CONTRAST CT CERVICAL SPINE WITHOUT CONTRAST TECHNIQUE: Multidetector CT imaging of the head and cervical spine was performed following the standard protocol without intravenous contrast. Multiplanar CT image reconstructions of the cervical spine were also generated. COMPARISON:  None. FINDINGS: CT HEAD FINDINGS Brain: No evidence of acute infarction, hemorrhage, hydrocephalus, extra-axial collection or mass lesion/mass effect. Vascular: No hyperdense vessel or unexpected calcification. Skull: Normal. Negative for fracture or focal lesion. Sinuses/Orbits: No acute finding. Evidence of previous maxillary sinus surgery. Slight mucosal thickening in the ethmoid air cells. Other: None CT CERVICAL SPINE FINDINGS Alignment: Normal. Skull base and vertebrae: No acute fracture. No primary bone lesion or focal pathologic process. Soft tissues and spinal canal: No prevertebral fluid or swelling. No visible canal hematoma. Disc levels: There are no disc protrusions or significant disc bulges in the cervical spine. There is no spinal stenosis. There is moderate left foraminal stenosis at C6-7 due to facet arthritis. The neural foramina throughout the remainder of the cervical spine are widely patent. Moderate right facet arthritis at C3-4. Upper chest: Focal area of infiltrate at the right lung apex posteriorly. Patchy haziness in both lung apices. Endotracheal tube in place. Other: None IMPRESSION: 1. Normal CT scan of the head. 2. No significant abnormality of the cervical spine. Moderate left foraminal stenosis at C6-7. Electronically Signed   By: Lorriane Shire M.D.   On:  11/23/2019 17:30   CT Cervical Spine Wo Contrast  Result Date: 11/23/2019 CLINICAL DATA:  Head trauma. Cardiac arrest. EXAM: CT HEAD WITHOUT CONTRAST CT CERVICAL SPINE WITHOUT CONTRAST TECHNIQUE: Multidetector CT imaging of the head and cervical spine was performed following the standard protocol without intravenous contrast. Multiplanar CT image reconstructions of the cervical spine were also generated. COMPARISON:  None. FINDINGS: CT HEAD FINDINGS Brain: No evidence of acute infarction, hemorrhage, hydrocephalus, extra-axial collection or mass lesion/mass effect. Vascular: No hyperdense vessel or unexpected calcification. Skull: Normal. Negative for fracture or focal lesion. Sinuses/Orbits: No acute finding. Evidence of previous maxillary sinus surgery. Slight mucosal thickening in the ethmoid air cells. Other: None CT CERVICAL SPINE FINDINGS Alignment: Normal. Skull base and vertebrae: No acute fracture. No primary bone lesion or focal pathologic process. Soft tissues and spinal canal: No prevertebral fluid or swelling. No visible canal hematoma. Disc levels: There are no disc protrusions or significant disc bulges in  the cervical spine. There is no spinal stenosis. There is moderate left foraminal stenosis at C6-7 due to facet arthritis. The neural foramina throughout the remainder of the cervical spine are widely patent. Moderate right facet arthritis at C3-4. Upper chest: Focal area of infiltrate at the right lung apex posteriorly. Patchy haziness in both lung apices. Endotracheal tube in place. Other: None IMPRESSION: 1. Normal CT scan of the head. 2. No significant abnormality of the cervical spine. Moderate left foraminal stenosis at C6-7. Electronically Signed   By: Lorriane Shire M.D.   On: 11/23/2019 17:30   CARDIAC CATHETERIZATION  Result Date: 11/23/2019  Prox LAD lesion is 50% stenosed.  Prox RCA lesion is 100% stenosed. Infarct-related artery TIMI 0 flow  Dist Cx lesion is 90% stenosed.   Normal left ventricular function ejection fraction between 50 and 55%  Conclusion STEMI presentation 100% proximal RCA TIMI 0 flow Successful PCI and stent with DES 3.0 x 26 mm resolute Onyx to 13 atm to proximal RCA restoring TIMI-3 flow Left main free of disease LAD large with a proximal 50% lesion Circumflex very large with a distal 90% distal circumflex lesion Mixed dominant system Preserved left ventricular function of 50 to 55% Circumflex lesion intervention was deferred   DG Chest Port 1 View  Result Date: 11/23/2019 CLINICAL DATA:  Central line placement and OG placement EXAM: PORTABLE CHEST 1 VIEW COMPARISON:  Radiograph 11/23/2019 FINDINGS: Endotracheal tube terminates in the mid trachea, 3 cm from the carina. A transesophageal tube tip and side port distal to the GE junction, terminating about the level of the gastric antrum. A right IJ catheter tip is present in the mid to upper SVC. Overlying pacer pads are noted. Telemetry wires project over lower chest as well. Hazy opacities in the mid to upper lungs are similar to prior. No pneumothorax. No effusion. The cardiomediastinal contours are unremarkable. Coarse calcifications noted upper pole left kidney. IMPRESSION: 1. Endotracheal tube 3 cm from the carina. 2. A transesophageal tube tip and side port are distal to the GE junction. 3. Right IJ catheter tip in the mid to upper SVC. 4. Stable hazy opacities in the mid to upper lungs bilaterally. 5. Left upper pole renal calcification. Electronically Signed   By: Lovena Le M.D.   On: 11/23/2019 21:48   ECHOCARDIOGRAM COMPLETE  Result Date: 11/24/2019   ECHOCARDIOGRAM REPORT   Patient Name:   Kurt [name] Date of Exam: 11/24/2019 Medical Rec #:  [id number]      Height:       69.0 in Accession #:    [id number]     Weight:       196.4 lb Date of Birth:  1956-09-20      BSA:          2.05 m Patient Age:    63 years       BP:           125/95 mmHg Patient Gender: M              HR:           52  bpm. Exam Location:  [facility] Procedure: 2D Echo and Intracardiac Opacification Agent Indications:     Cardiac Arrest  History:         Patient has no prior history of Echocardiogram examinations.                  CAD, COPD; Risk Factors:Cocaine Abuse.  Sonographer:     LTM  Referring Phys:  [id number] [name] Diagnosing Phys: Yolonda Kida MD  Sonographer Comments: Echo performed with patient supine and on artificial respirator, no parasternal window and no apical window. Image acquisition challenging due to COPD. IMPRESSIONS  1. Left ventricular ejection fraction, by visual estimation, is 50 to 55%. The left ventricle has normal function. There is no left ventricular hypertrophy.  2. Definity contrast agent was given IV to delineate the left ventricular endocardial borders.  3. The left ventricle has no regional wall motion abnormalities.  4. Akinetic right ventricular.  5. Global right ventricle has severely reduced systolic function.The right ventricular size is moderately enlarged. No increase in right ventricular wall thickness.  6. Left atrial size was normal.  7. Right atrial size was normal.  8. The mitral valve is normal in structure. No evidence of mitral valve regurgitation.  9. The tricuspid valve is normal in structure. 10. The aortic valve is normal in structure. Aortic valve regurgitation is not visualized. 11. The pulmonic valve was not well visualized. Pulmonic valve regurgitation is not visualized. 12. Normal pulmonary artery systolic pressure. 13. Left ventricular ejection fraction by PLAX is is 63 % FINDINGS  Left Ventricle: Left ventricular ejection fraction, by visual estimation, is 50 to 55%. Left ventricular ejection fraction by PLAX is is 63 % The left ventricle has normal function. Definity contrast agent was given IV to delineate the left ventricular endocardial borders. The left ventricle has no regional wall motion abnormalities. There is no left ventricular hypertrophy. Right  Ventricle: The right ventricular size is moderately enlarged. No increase in right ventricular wall thickness. Global RV systolic function is has severely reduced systolic function. The tricuspid regurgitant velocity is 1.61 m/s, and with an assumed right atrial pressure of 10 mmHg, the estimated right ventricular systolic pressure is normal at 20.4 mmHg. The motion of the right ventricle akinetic. Left Atrium: Left atrial size was normal in size. Right Atrium: Right atrial size was normal in size Pericardium: There is no evidence of pericardial effusion. Mitral Valve: The mitral valve is normal in structure. No evidence of mitral valve regurgitation. Tricuspid Valve: The tricuspid valve is normal in structure. Tricuspid valve regurgitation is trivial. Aortic Valve: The aortic valve is normal in structure. Aortic valve regurgitation is not visualized. Aortic valve mean gradient measures 2.0 mmHg. Aortic valve peak gradient measures 3.4 mmHg. Aortic valve area, by VTI measures 3.01 cm. Pulmonic Valve: The pulmonic valve was not well visualized. Pulmonic valve regurgitation is not visualized. Pulmonic regurgitation is not visualized. Aorta: The aortic root is normal in size and structure. IAS/Shunts: No atrial level shunt detected by color flow Doppler.  LEFT VENTRICLE PLAX 2D LV EF:         Left ventricular ejection fraction by PLAX is is 63 % LVIDd:         5.23 cm LVIDs:         4.19 cm LV PW:         0.88 cm LV IVS:        1.18 cm LVOT diam:     2.50 cm LV SV:         53 ml LV SV Index:   25.24 LVOT Area:     4.91 cm  LV Volumes (MOD) LV area d, A4C:    21.80 cm LV area s, A4C:    10.20 cm LV major d, A4C:   7.30 cm LV major s, A4C:   5.81 cm LV vol d, MOD  A4C: 52.4 ml LV vol s, MOD A4C: 14.8 ml LV SV MOD A4C:     52.4 ml AORTIC VALVE AV Area (Vmax):    2.77 cm AV Area (Vmean):   2.48 cm AV Area (VTI):     3.01 cm AV Vmax:           91.60 cm/s AV Vmean:          69.800 cm/s AV VTI:            0.137 m AV  Peak Grad:      3.4 mmHg AV Mean Grad:      2.0 mmHg LVOT Vmax:         51.70 cm/s LVOT Vmean:        35.200 cm/s LVOT VTI:          0.084 m LVOT/AV VTI ratio: 0.61 MV E velocity: 39.80 cm/s 103 cm/s  TRICUSPID VALVE MV A velocity: 49.00 cm/s 70.3 cm/s TR Peak grad:   10.4 mmHg MV E/A ratio:  0.81       1.5       TR Vmax:        161.00 cm/s                                      SHUNTS                                     Systemic VTI:  0.08 m                                     Systemic Diam: 2.50 cm  Tericka Devincenzi D Laurielle Selmon MD Electronically signed by Yolonda Kida MD Signature Date/Time: 11/24/2019/11:08:46 AM    Final        TELEMETRY: Normal sinus rhythm rate of about 90 nonspecific ST-T wave changes  ASSESSMENT AND PLAN:  Active Problems:   Cardiac arrest [facility])   STEMI involving right coronary artery ([place])    1.  Status post STEMI PCI and stent to proximal RCA with DES Respiratory failure Status post cardiac arrest Cocaine abuse Hypotension Multisystem organ failure  Plan Continue current management Continue critical care management Maintain vent support Continue aspirin Brilinta Broad-spectrum antibiotics as necessary Pressors to help with hypotension   Yolonda Kida, MD 11/25/2019 12:32 PM

## 2019-11-26 LAB — GLUCOSE, CAPILLARY
Glucose-Capillary: 105 mg/dL — ABNORMAL HIGH (ref 70–99)
Glucose-Capillary: 111 mg/dL — ABNORMAL HIGH (ref 70–99)
Glucose-Capillary: 111 mg/dL — ABNORMAL HIGH (ref 70–99)
Glucose-Capillary: 122 mg/dL — ABNORMAL HIGH (ref 70–99)
Glucose-Capillary: 95 mg/dL (ref 70–99)
Glucose-Capillary: 97 mg/dL (ref 70–99)

## 2019-11-26 LAB — CBC WITH DIFFERENTIAL/PLATELET
Abs Immature Granulocytes: 0.09 10*3/uL — ABNORMAL HIGH (ref 0.00–0.07)
Basophils Absolute: 0 10*3/uL (ref 0.0–0.1)
Basophils Relative: 0 %
Eosinophils Absolute: 0 10*3/uL (ref 0.0–0.5)
Eosinophils Relative: 0 %
HCT: 32.5 % — ABNORMAL LOW (ref 39.0–52.0)
Hemoglobin: 10.6 g/dL — ABNORMAL LOW (ref 13.0–17.0)
Immature Granulocytes: 1 %
Lymphocytes Relative: 5 %
Lymphs Abs: 0.8 10*3/uL (ref 0.7–4.0)
MCH: 32.8 pg (ref 26.0–34.0)
MCHC: 32.6 g/dL (ref 30.0–36.0)
MCV: 100.6 fL — ABNORMAL HIGH (ref 80.0–100.0)
Monocytes Absolute: 0.9 10*3/uL (ref 0.1–1.0)
Monocytes Relative: 6 %
Neutro Abs: 13 10*3/uL — ABNORMAL HIGH (ref 1.7–7.7)
Neutrophils Relative %: 88 %
Platelets: 117 10*3/uL — ABNORMAL LOW (ref 150–400)
RBC: 3.23 MIL/uL — ABNORMAL LOW (ref 4.22–5.81)
RDW: 13.2 % (ref 11.5–15.5)
WBC: 14.8 10*3/uL — ABNORMAL HIGH (ref 4.0–10.5)
nRBC: 0 % (ref 0.0–0.2)

## 2019-11-26 LAB — COMPREHENSIVE METABOLIC PANEL
ALT: 53 U/L — ABNORMAL HIGH (ref 0–44)
AST: 105 U/L — ABNORMAL HIGH (ref 15–41)
Albumin: 2.5 g/dL — ABNORMAL LOW (ref 3.5–5.0)
Alkaline Phosphatase: 36 U/L — ABNORMAL LOW (ref 38–126)
Anion gap: 9 (ref 5–15)
BUN: 24 mg/dL — ABNORMAL HIGH (ref 8–23)
CO2: 32 mmol/L (ref 22–32)
Calcium: 7 mg/dL — ABNORMAL LOW (ref 8.9–10.3)
Chloride: 104 mmol/L (ref 98–111)
Creatinine, Ser: 0.99 mg/dL (ref 0.61–1.24)
GFR calc Af Amer: >60 mL/min (ref >60)
GFR calc non Af Amer: >60 mL/min (ref >60)
Glucose, Bld: 119 mg/dL — ABNORMAL HIGH (ref 70–99)
Potassium: 3 mmol/L — ABNORMAL LOW (ref 3.5–5.1)
Sodium: 145 mmol/L (ref 135–145)
Total Bilirubin: 0.9 mg/dL (ref 0.3–1.2)
Total Protein: 4.9 g/dL — ABNORMAL LOW (ref 6.5–8.1)

## 2019-11-26 LAB — TRIGLYCERIDES: Triglycerides: 227 mg/dL — ABNORMAL HIGH (ref <150)

## 2019-11-26 NOTE — Progress Notes (Signed)
[facility]  ROUNDING NOTE   Subjective:   UOP 1057m.  Creatinine 0.99 (1.26)  NS with 20 KCl at 758mhr  Objective:  Vital signs in last 24 hours:  Temp:  [98.2 F (36.8 C)-99 F (37.2 C)] 98.5 F (36.9 C) (12/27 0900) Pulse Rate:  [60-89] 82 (12/27 0900) Resp:  [0-28] 24 (12/27 0900) BP: (81-150)/(55-100) 119/82 (12/27 0900) SpO2:  [87 %-99 %] 94 % (12/27 0900) Arterial Line BP: (101-167)/(46-84) 130/63 (12/27 0900) FiO2 (%):  [35 %-40 %] 35 % (12/27 0847) Weight:  [91.9 kg] 91.9 kg (12/27 0500)  Weight change: -1.8 kg Filed Weights   11/24/19 0500 11/25/19 0500 11/26/19 0500  Weight: 89.1 kg 93.7 kg 91.9 kg    Intake/Output: I/O last 3 completed shifts: In: 7923.7 [I.V.:7410.6; IV Piggyback:513.2] Out: 166378U[HYIFO:2774Emesis/NG output:150]   Intake/Output this shift:  No intake/output data recorded.  Physical Exam: General: Critically ill  Head: ETT  Eyes: Anicteric, PERRL  Neck:   trachea midline  Lungs:  PRVC FiO2 35%  Heart: Regular rate and rhythm  Abdomen:  Soft, nontender  Extremities:  + peripheral edema.  Neurologic: Intubated and sedated  Skin: No lesions        Basic Metabolic Panel: Recent Labs  Lab 11/23/19 1624 11/23/19 2314 11/24/19 0051 11/24/19 0838 11/25/19 0418 11/26/19 0422  NA  --  141 142 143 143 145  K  --  5.9* 5.8* 4.2 3.2* 3.0*  CL  --  112* 113* 110 103 104  CO2  --  23 20* 24 25 32  GLUCOSE  --  164* 207* 140* 150* 119*  BUN  --  27* 28* 34* 28* 24*  CREATININE  --  1.46* 1.40* 1.44* 1.26* 0.99  CALCIUM  --  6.7* 6.1* 6.9* 6.6* 7.0*  MG 2.3  --   --   --  1.8  --     Liver Function Tests: Recent Labs  Lab 11/23/19 1627 11/24/19 0051 11/25/19 0418 11/26/19 0422  AST 65* 143* 167* 105*  ALT 42 56* 65* 53*  ALKPHOS 95 53 41 36*  BILITOT 0.7 0.7 0.7 0.9  PROT 6.6 5.3* 4.9* 4.9*  ALBUMIN 3.3* 2.7* 2.6* 2.5*   No results for input(s): LIPASE, AMYLASE in the last 168 hours. No results for  input(s): AMMONIA in the last 168 hours.  CBC: Recent Labs  Lab 11/23/19 1627 11/23/19 2314 11/24/19 0051 11/25/19 0418 11/26/19 0422  WBC 20.6* 28.8* 28.0* 20.1* 14.8*  NEUTROABS 13.4*  --   --  17.3* 13.0*  HGB 16.8 15.1 15.6 12.7* 10.6*  HCT 50.1 48.3 47.4 37.5* 32.5*  MCV 100.0 105.0* 101.5* 98.2 100.6*  PLT 195 200 207 133* 117*    Cardiac Enzymes: No results for input(s): CKTOTAL, CKMB, CKMBINDEX, TROPONINI in the last 168 hours.  BNP: Invalid input(s): POCBNP  CBG: Recent Labs  Lab 11/25/19 1145 11/25/19 1512 11/25/19 1932 11/25/19 2330 11/26/19 0331  GLUCAP 136* 129* 84 76 97    Microbiology: Results for orders placed or performed during the hospital encounter of 11/23/19  Respiratory Panel by RT PCR (Flu A&B, Covid) - Nasopharyngeal Swab     Status: None   Collection Time: 11/23/19  4:24 PM   Specimen: Nasopharyngeal Swab  Result Value Ref Range Status   SARS Coronavirus 2 by RT PCR NEGATIVE NEGATIVE Final    Comment: (NOTE) SARS-CoV-2 target nucleic acids are NOT DETECTED. The SARS-CoV-2 RNA is generally detectable in upper respiratoy specimens during the  acute phase of infection. The lowest concentration of SARS-CoV-2 viral copies this assay can detect is 131 copies/mL. A negative result does not preclude SARS-Cov-2 infection and should not be used as the sole basis for treatment or other patient management decisions. A negative result may occur with  improper specimen collection/handling, submission of specimen other than nasopharyngeal swab, presence of viral mutation(s) within the areas targeted by this assay, and inadequate number of viral copies (<131 copies/mL). A negative result must be combined with clinical observations, patient history, and epidemiological information. The expected result is Negative. Fact Sheet for Patients:  PinkCheek.be Fact Sheet for Healthcare Providers:   GravelBags.it This test is not yet ap proved or cleared by the [place] FDA and  has been authorized for detection and/or diagnosis of SARS-CoV-2 by FDA under an Emergency Use Authorization (EUA). This EUA will remain  in effect (meaning this test can be used) for the duration of the COVID-19 declaration under Section 564(b)(1) of the Act, 21 U.S.C. section 360bbb-3(b)(1), unless the authorization is terminated or revoked sooner.    Influenza A by PCR NEGATIVE NEGATIVE Final   Influenza B by PCR NEGATIVE NEGATIVE Final    Comment: (NOTE) The Xpert Xpress SARS-CoV-2/FLU/RSV assay is intended as an aid in  the diagnosis of influenza from Nasopharyngeal swab specimens and  should not be used as a sole basis for treatment. Nasal washings and  aspirates are unacceptable for Xpert Xpress SARS-CoV-2/FLU/RSV  testing. Fact Sheet for Patients: PinkCheek.be Fact Sheet for Healthcare Providers: GravelBags.it This test is not yet approved or cleared by the [place] FDA and  has been authorized for detection and/or diagnosis of SARS-CoV-2 by  FDA under an Emergency Use Authorization (EUA). This EUA will remain  in effect (meaning this test can be used) for the duration of the  Covid-19 declaration under Section 564(b)(1) of the Act, 21  U.S.C. section 360bbb-3(b)(1), unless the authorization is  terminated or revoked. Performed at [facility], [address], [place], [place] [address]   Urine Culture     Status: None   Collection Time: 11/23/19  4:27 PM   Specimen: Urine, Random  Result Value Ref Range Status   Specimen Description   Final    URINE, RANDOM Performed at [facility], [address] [place], [place] [address]    Special Requests   Final    Normal Performed at [facility], [address], [place], [place] [address]    Culture   Final    NO  GROWTH Performed at [facility], [place] [address], [place], [place] [address]    Report Status 11/25/2019 FINAL  Final  MRSA PCR Screening     Status: None   Collection Time: 11/23/19  9:44 PM   Specimen: Nasopharyngeal  Result Value Ref Range Status   MRSA by PCR NEGATIVE NEGATIVE Final    Comment:        The GeneXpert MRSA Assay (FDA approved for NASAL specimens only), is one component of a comprehensive MRSA colonization surveillance program. It is not intended to diagnose MRSA infection nor to guide or monitor treatment for MRSA infections. Performed at [facility], [address] [place],  [address]   CULTURE, BLOOD (ROUTINE X 2) w Reflex to ID Panel     Status: None (Preliminary result)   Collection Time: 11/24/19  4:03 AM   Specimen: BLOOD  Result Value Ref Range Status   Specimen Description BLOOD LEFT UPPER  ARM  Final   Special Requests   Final    BOTTLES DRAWN AEROBIC AND ANAEROBIC Blood Culture adequate volume   Culture   Final    NO GROWTH 2 DAYS Performed at [facility], [address] [place], [place] [address]    Report Status PENDING  Incomplete  CULTURE, BLOOD (ROUTINE X 2) w Reflex to ID Panel     Status: None (Preliminary result)   Collection Time: 11/24/19  4:03 AM   Specimen: BLOOD  Result Value Ref Range Status   Specimen Description BLOOD BLOOD LEFT FOREARM  Final   Special Requests   Final    BOTTLES DRAWN AEROBIC ONLY Blood Culture results may not be optimal due to an inadequate volume of blood received in culture bottles   Culture   Final    NO GROWTH 2 DAYS Performed at [facility], [address], [place], [place] [address]    Report Status PENDING  Incomplete  Culture, respiratory (non-expectorated)     Status: None (Preliminary result)   Collection Time: 11/25/19  4:48 AM   Specimen: Tracheal Aspirate; Respiratory  Result Value Ref Range Status   Specimen Description   Final    TRACHEAL  ASPIRATE Performed at [facility], [address] [place], [place] [address]    Special Requests   Final    Normal Performed at [facility], [address] [place], [place] [address]    Gram Stain   Final    RARE WBC PRESENT, PREDOMINANTLY MONONUCLEAR RARE GRAM POSITIVE COCCI IN PAIRS Performed at [facility], [place] [address] [place], [place] [address]    Culture PENDING  Incomplete   Report Status PENDING  Incomplete    Coagulation Studies: Recent Labs    11/23/19 1627 11/23/19 2314  LABPROT 15.8* 18.0*  INR 1.3* 1.5*    Urinalysis: Recent Labs    11/23/19 1627  COLORURINE YELLOW*  LABSPEC 1.010  PHURINE 7.0  GLUCOSEU >=500*  HGBUR MODERATE*  BILIRUBINUR NEGATIVE  KETONESUR NEGATIVE  PROTEINUR 100*  NITRITE NEGATIVE  LEUKOCYTESUR NEGATIVE      Imaging: No results found.   Medications:   . sodium chloride    . sodium chloride    . 0.9 % NaCl with KCl 20 mEq / L 75 mL/hr at 11/26/19 0600  . famotidine (PEPCID) IV 20 mg (11/25/19 2155)  . fentaNYL infusion INTRAVENOUS 200 mcg/hr (11/26/19 0641)  . norepinephrine (LEVOPHED) Adult infusion 5 mcg/min (11/26/19 0600)  . piperacillin-tazobactam (ZOSYN)  IV 3.375 g (11/26/19 0601)  . propofol (DIPRIVAN) infusion 30 mcg/kg/min (11/26/19 8127)   . aspirin  81 mg Per Tube Daily  . chlorhexidine gluconate (MEDLINE KIT)  15 mL Mouth Rinse BID  . Chlorhexidine Gluconate Cloth  6 each Topical Daily  . fentaNYL (SUBLIMAZE) injection  50 mcg Intravenous Once  . heparin  5,000 Units Subcutaneous Q8H  . hydrocortisone sod succinate (SOLU-CORTEF) inj  50 mg Intravenous Q6H  . insulin aspart  0-15 Units Subcutaneous Q4H  . ipratropium-albuterol  3 mL Nebulization Q4H  . mouth rinse  15 mL Mouth Rinse 10 times per day  . rosuvastatin  40 mg Oral q1800  . ticagrelor  90 mg Per Tube BID   acetaminophen, acetaminophen, albuterol, fentaNYL, midazolam, ondansetron (ZOFRAN) IV, sodium chloride  flush, vecuronium  Assessment/ Plan:  Mr. Kurt [name] is a 63 y.o. white male with COPD, hypertension, substance , who was admitted to [facility] on 11/23/2019 for STEMI  1. Acute renal failure with metabolic acidosis and hyperkalemia: now with hypokalemia requiring IV replacement.  Baseline creatinine of 1.02, normal GFR on 03/16/2018.  Nonoliguric urine output Urine with glycosuria, hematuria and proteinuria. Cardiac catheterization with IV dye exposure on 12/24.  - holding lisinoporil - Discontinued Lokelma - Bicarb infusion - change to Normal Saline with potassium.   2. Hypotension with cardiogenic shock: on cooling protocol.  Echocardiogram reviewed.  - weaned off vasopressors: norepinephrine and vasopressin - appreciate cardiology input  3. Anemia with chronic kidney disease: macrocytic. Hemoglobin 10.6. History of alcohol and cocaine abuse.   LOS: 3 Obi Scrima 12/27/20209:47 AM

## 2019-11-26 NOTE — Progress Notes (Signed)
Clinical status relayed to family  Updated and notified of patients medical condition-  Progressive multiorgan failure with very low chance of meaningful recovery.   Family understands the situation.  Family are satisfied with Plan of action and management. All questions answered  Will need to monitor neurological  progression  Corrin Parker, M.D.  [facility]  Medical Director [place] Director [facility] Cardio-Pulmonary Department

## 2019-11-26 NOTE — Progress Notes (Signed)
Family Update:  I had received calls from both the patient's Niece and Sister who were able to verify the patient's password today. I gave updates and answered all questions that each of them had.

## 2019-11-26 NOTE — Progress Notes (Addendum)
[facility]    SUBJECTIVE: Intubated sedated   Vitals:   11/26/19 1700 11/26/19 1800 11/26/19 1900 11/26/19 1939  BP: 117/76 121/84 126/80   Pulse: 60 61 (!) 59   Resp: 17 (!) 22 (!) 22   Temp:      TempSrc:      SpO2: 95% 94% 95% 94%  Weight:      Height:         Intake/Output Summary (Last 24 hours) at 11/26/2019 2146 Last data filed at 11/26/2019 1800 Gross per 24 hour  Intake 2215.55 ml  Output 980 ml  Net 1235.55 ml      PHYSICAL EXAM  General: Well developed, well nourished, in no acute distress HEENT:  Normocephalic and atramatic Neck:  No JVD.  Lungs: Clear bilaterally to auscultation and percussion. Heart: HRRR . Normal S1 and S2 without gallops or murmurs.  Abdomen: Bowel sounds are positive, abdomen soft and non-tender  Msk:  Back normal, normal gait. Normal strength and tone for age. Extremities: No clubbing, cyanosis or edema.   Neuro: Alert and oriented X 3. Psych:  Good affect, responds appropriately   LABS: Basic Metabolic Panel: Recent Labs    11/25/19 0418 11/26/19 0422  NA 143 145  K 3.2* 3.0*  CL 103 104  CO2 25 32  GLUCOSE 150* 119*  BUN 28* 24*  CREATININE 1.26* 0.99  CALCIUM 6.6* 7.0*  MG 1.8  --    Liver Function Tests: Recent Labs    11/25/19 0418 11/26/19 0422  AST 167* 105*  ALT 65* 53*  ALKPHOS 41 36*  BILITOT 0.7 0.9  PROT 4.9* 4.9*  ALBUMIN 2.6* 2.5*   No results for input(s): LIPASE, AMYLASE in the last 72 hours. CBC: Recent Labs    11/25/19 0418 11/26/19 0422  WBC 20.1* 14.8*  NEUTROABS 17.3* 13.0*  HGB 12.7* 10.6*  HCT 37.5* 32.5*  MCV 98.2 100.6*  PLT 133* 117*   Cardiac Enzymes: No results for input(s): CKTOTAL, CKMB, CKMBINDEX, TROPONINI in the last 72 hours. BNP: Invalid input(s): POCBNP D-Dimer: No results for input(s): DDIMER in the last 72 hours. Hemoglobin A1C: No results for input(s): HGBA1C in the last 72 hours. Fasting Lipid Panel: Recent Labs    11/23/19 2314  TRIG 233*    Thyroid Function Tests: No results for input(s): TSH, T4TOTAL, T3FREE, THYROIDAB in the last 72 hours.  Invalid input(s): FREET3 Anemia Panel: No results for input(s): VITAMINB12, FOLATE, FERRITIN, TIBC, IRON, RETICCTPCT in the last 72 hours.  No results found.   Echo as described  TELEMETRY: Sinus rhythm nonspecific EKG changes  ASSESSMENT AND PLAN:  Active Problems:   Cardiac arrest [facility])   STEMI involving right coronary artery ([place])     1.  Respiratory failure Status post STEMI PCI and stent RCA Possible sepsis Hypotension Multisystem organ failure Cocaine abuse  . Plan Agree with ICU level care Continue respiratory support Agree with broad-spectrum antibiotic therapy Continue pressor therapy for blood pressure support Nephrology input for renal insufficiency Neurology input is appreciated for mental status assessment Agree with aspirin Brilinta post PCI and stent   Yolonda Kida, MD 11/26/2019 9:46 PM

## 2019-11-26 NOTE — Progress Notes (Signed)
Pharmacy Antibiotic Note  Kurt [name] is a 63 y.o. male admitted on 11/23/2019 with aspiration PNA .  Pharmacy has been consulted for Zosyn dosing. -on Vent  Plan: Day 4 of abx Zosyn 3.375g IV q8h (4 hour infusion).  Height: 5\' 9"  (175.3 cm) Weight: 202 lb 9.6 oz (91.9 kg) IBW/kg (Calculated) : 70.7  Temp (24hrs), Avg:98.7 F (37.1 C), Min:98.2 F (36.8 C), Max:99 F (37.2 C)  Recent Labs  Lab 11/23/19 1627 11/23/19 2314 11/24/19 0051 11/24/19 0242 11/24/19 0838 11/25/19 0418 11/26/19 0422  WBC 20.6* 28.8* 28.0*  --   --  20.1* 14.8*  CREATININE 1.39* 1.46* 1.40*  --  1.44* 1.26* 0.99  LATICACIDVEN  --  1.9  --  2.8*  --   --   --     Estimated Creatinine Clearance: 87.8 mL/min (by C-G formula based on SCr of 0.99 mg/dL).    No Known Allergies  Microbiology results:  12/25 BCx: [id number]  12/24 UCx: NG  12/24 MRSA PCR: negative 12/26 trach aspirate: rare GPC  Thank you for allowing pharmacy to be a part of this patient's care.  Kurt [name] A 11/26/2019 3:05 PM

## 2019-11-26 NOTE — Progress Notes (Signed)
CRITICAL CARE NOTE Synopsis 63 year old white male with acute cardiac arrest with acute MI Status post cath RCA occlusion associated with with cocaine toxicity Status post hypothermia protocol   CC  Follow-up cardiac arrest  HPI Remains critically ill Prognosis is Remains on vent On vasopressors Multiorgan failure Status post hypothermia protocol Neurology assessment pending We will need to consider MRI or CT scan for further evaluation  Vent Mode: PRVC FiO2 (%):  [35 %-40 %] 35 % Set Rate:  [22 bmp] 22 bmp Vt Set:  [550 mL] 550 mL PEEP:  [5 cmH20-10 cmH20] 5 cmH20 Plateau Pressure:  [20 cmH20-27 cmH20] 22 cmH20   BP 102/61   Pulse 77   Temp 99 F (37.2 C) (Core)   Resp (!) 22   Ht 5\' 9"  (1.753 m)   Wt 91.9 kg   SpO2 96%   BMI 29.92 kg/m    I/O last 3 completed shifts: In: 6487.8 [I.V.:6099.1; IV Piggyback:388.7] Out: 2040 [Urine:1690; Emesis/NG output:350] Total I/O In: 1441.9 [I.V.:1317.5; IV Piggyback:124.4] Out: 500 [Urine:450; Emesis/NG output:50]  SpO2: 96 % FiO2 (%): 35 %   SIGNIFICANT EVENTS 12/24-intubated/Vent support, cardiac arrest, s/p CATH-100% OCCLUSION OF RCA, COCAINE TOXICITY 12/24-HYPOTHERMIA PROTOCOL 12/24 - Critically ill, requiring 3 vasopressors, 100% FiO2 & 11 PEEP 12/25 remains on pressors, on vent, fio2 100% 12/26 remains on hypothermia protocol 12/27 completed hypothermia protocol  REVIEW OF SYSTEMS  PATIENT IS UNABLE TO PROVIDE COMPLETE REVIEW OF SYSTEM S DUE TO SEVERE CRITICAL ILLNESS AND ENCEPHALOPATHY  PHYSICAL EXAMINATION:  GENERAL:critically ill appearing, +resp distress HEAD: Normocephalic, atraumatic.  EYES: Pupils equal, round, reactive to light.  No scleral icterus.  MOUTH: Moist mucosal membrane. NECK: Supple. No thyromegaly. No nodules. No JVD.  PULMONARY: +rhonchi, +wheezing CARDIOVASCULAR: S1 and S2. Regular rate and rhythm. No murmurs, rubs, or gallops.  GASTROINTESTINAL: Soft, nontender, -distended.  Positive bowel sounds.  MUSCULOSKELETAL: No swelling, clubbing, or edema.  NEUROLOGIC: obtunded SKIN:intact,warm,dry  MEDICATIONS: I have reviewed all medications and confirmed regimen as documented   CULTURE RESULTS   Recent Results (from the past 240 hour(s))  Respiratory Panel by RT PCR (Flu A&B, Covid) - Nasopharyngeal Swab     Status: None   Collection Time: 11/23/19  4:24 PM   Specimen: Nasopharyngeal Swab  Result Value Ref Range Status   SARS Coronavirus 2 by RT PCR NEGATIVE NEGATIVE Final    Comment: (NOTE) SARS-CoV-2 target nucleic acids are NOT DETECTED. The SARS-CoV-2 RNA is generally detectable in upper respiratoy specimens during the acute phase of infection. The lowest concentration of SARS-CoV-2 viral copies this assay can detect is 131 copies/mL. A negative result does not preclude SARS-Cov-2 infection and should not be used as the sole basis for treatment or other patient management decisions. A negative result may occur with  improper specimen collection/handling, submission of specimen other than nasopharyngeal swab, presence of viral mutation(s) within the areas targeted by this assay, and inadequate number of viral copies (<131 copies/mL). A negative result must be combined with clinical observations, patient history, and epidemiological information. The expected result is Negative. Fact Sheet for Patients:  PinkCheek.be Fact Sheet for Healthcare Providers:  GravelBags.it This test is not yet ap proved or cleared by the [place] FDA and  has been authorized for detection and/or diagnosis of SARS-CoV-2 by FDA under an Emergency Use Authorization (EUA). This EUA will remain  in effect (meaning this test can be used) for the duration of the COVID-19 declaration under Section 564(b)(1) of the Act, 21 U.S.C. section  360bbb-3(b)(1), unless the authorization is terminated or revoked sooner.     Influenza A by PCR NEGATIVE NEGATIVE Final   Influenza B by PCR NEGATIVE NEGATIVE Final    Comment: (NOTE) The Xpert Xpress SARS-CoV-2/FLU/RSV assay is intended as an aid in  the diagnosis of influenza from Nasopharyngeal swab specimens and  should not be used as a sole basis for treatment. Nasal washings and  aspirates are unacceptable for Xpert Xpress SARS-CoV-2/FLU/RSV  testing. Fact Sheet for Patients: PinkCheek.be Fact Sheet for Healthcare Providers: GravelBags.it This test is not yet approved or cleared by the [place] FDA and  has been authorized for detection and/or diagnosis of SARS-CoV-2 by  FDA under an Emergency Use Authorization (EUA). This EUA will remain  in effect (meaning this test can be used) for the duration of the  Covid-19 declaration under Section 564(b)(1) of the Act, 21  U.S.C. section 360bbb-3(b)(1), unless the authorization is  terminated or revoked. Performed at [facility], [address], [place], [place] [address]   Urine Culture     Status: None   Collection Time: 11/23/19  4:27 PM   Specimen: Urine, Random  Result Value Ref Range Status   Specimen Description   Final    URINE, RANDOM Performed at [facility], [address] [place], [place] [address]    Special Requests   Final    Normal Performed at [facility], [address], [place], [place] [address]    Culture   Final    NO GROWTH Performed at [facility], [place] [address], [place], [place] [address]    Report Status 11/25/2019 FINAL  Final  MRSA PCR Screening     Status: None   Collection Time: 11/23/19  9:44 PM   Specimen: Nasopharyngeal  Result Value Ref Range Status   MRSA by PCR NEGATIVE NEGATIVE Final    Comment:        The GeneXpert MRSA Assay (FDA approved for NASAL specimens only), is one component of a comprehensive MRSA colonization surveillance program. It is  not intended to diagnose MRSA infection nor to guide or monitor treatment for MRSA infections. Performed at [facility], [address] [place], [place] [address]   CULTURE, BLOOD (ROUTINE X 2) w Reflex to ID Panel     Status: None (Preliminary result)   Collection Time: 11/24/19  4:03 AM   Specimen: BLOOD  Result Value Ref Range Status   Specimen Description BLOOD LEFT UPPER ARM  Final   Special Requests   Final    BOTTLES DRAWN AEROBIC AND ANAEROBIC Blood Culture adequate volume   Culture   Final    NO GROWTH 1 DAY Performed at [facility], [address], [place], [place] [address]    Report Status PENDING  Incomplete  CULTURE, BLOOD (ROUTINE X 2) w Reflex to ID Panel     Status: None (Preliminary result)   Collection Time: 11/24/19  4:03 AM   Specimen: BLOOD  Result Value Ref Range Status   Specimen Description BLOOD BLOOD LEFT FOREARM  Final   Special Requests   Final    BOTTLES DRAWN AEROBIC ONLY Blood Culture results may not be optimal due to an inadequate volume of blood received in culture bottles   Culture   Final    NO GROWTH 1 DAY Performed at [facility], [address], [place], [place] [address]    Report Status PENDING  Incomplete  Culture, respiratory (non-expectorated)  Status: None (Preliminary result)   Collection Time: 11/25/19  4:48 AM   Specimen: Tracheal Aspirate; Respiratory  Result Value Ref Range Status   Specimen Description   Final    TRACHEAL ASPIRATE Performed at [facility], [address], [place], [place] [address]    Special Requests   Final    Normal Performed at [facility], [address] [place], [place] [address]    Gram Stain   Final    RARE WBC PRESENT, PREDOMINANTLY MONONUCLEAR RARE GRAM POSITIVE COCCI IN PAIRS Performed at [facility], [place] [address], [place], [place] [address]    Culture PENDING  Incomplete   Report Status PENDING  Incomplete   CBC     Component Value Date/Time   WBC 14.8 (H) 11/26/2019 0422   RBC 3.23 (L) 11/26/2019 0422   HGB 10.6 (L) 11/26/2019 0422   HGB 14.9 02/26/2013 1217   HCT 32.5 (L) 11/26/2019 0422   HCT 44.2 02/26/2013 1217   PLT 117 (L) 11/26/2019 0422   PLT 204 02/26/2013 1217   MCV 100.6 (H) 11/26/2019 0422   MCV 95 02/26/2013 1217   MCH 32.8 11/26/2019 0422   MCHC 32.6 11/26/2019 0422   RDW 13.2 11/26/2019 0422   RDW 13.2 02/26/2013 1217   LYMPHSABS 0.8 11/26/2019 0422   MONOABS 0.9 11/26/2019 0422   EOSABS 0.0 11/26/2019 0422   BASOSABS 0.0 11/26/2019 0422   BMP Latest Ref Rng & Units 11/26/2019 11/25/2019 11/24/2019  Glucose 70 - 99 mg/dL 119(H) 150(H) 140(H)  BUN 8 - 23 mg/dL 24(H) 28(H) 34(H)  Creatinine 0.61 - 1.24 mg/dL 0.99 1.26(H) 1.44(H)  Sodium 135 - 145 mmol/L 145 143 143  Potassium 3.5 - 5.1 mmol/L 3.0(L) 3.2(L) 4.2  Chloride 98 - 111 mmol/L 104 103 110  CO2 22 - 32 mmol/L 32 25 24  Calcium 8.9 - 10.3 mg/dL 7.0(L) 6.6(L) 6.9(L)       Indwelling Urinary Catheter continued, requirement due to   Reason to continue Indwelling Urinary Catheter strict Intake/Output monitoring for hemodynamic instability   Central Line/ continued, requirement due to  Reason to continue [place] of central venous pressure or other hemodynamic parameters and poor IV access   Ventilator continued, requirement due to severe respiratory failure   Ventilator Sedation RASS 0 to -2       ASSESSMENT AND PLAN SYNOPSIS   63 yo white male with acute and severe hypoxic respiratory failure from acute V fib cardiac arrest and sudden cardiac deathSTEMI from acute RCA OCCLUSION andfrom COCAINE Toxicity. Now with Cardiogenic shock requiring multiple vasopressors. Concern for possible aspiration pneumonia.   Severe acute hypoxic and hypercapnic respiratory failure Continue full mechanical support Continue BD therapy Wean FiO2 and PEEP as tolerated  Acute MI and cardiac  failure-STEMI and cocaine abuse Follow-up cardiology recommendations Completed hypothermia protocol Antiplatelet agent therapy as per cardiology  Acute kidney injury and renal failure Follow Chem-7 Follow urine output Foley as needed Avoid nephrotoxic agents   Neurology Completed hypothermia protocol Plan for sedation vacation and assess neurological status We will need to consider CT or MRI for further assessment   Cardiac Acute MI Follow cardiology recommendations Continue ICU monitoring  Infectious disease Continue antibiotics for aspiration pneumonia   GI GI PROPHYLAXIS as indicated  NUTRITIONAL STATUS DIET-->TF's as tolerated Constipation protocol as indicated  ELECTROLYTES -follow labs as needed -replace as needed -pharmacy consultation and following   ENDO - will use ICU hypoglycemic\Hyperglycemia protocol if indicated  DVT/GI PRX ordered TRANSFUSIONS AS NEEDED MONITOR FSBS ASSESS the need for LABS as needed    Critical Care Time devoted to patient care services described in this note is 35 minutes.   Overall, patient is critically ill, prognosis is guarded.  Patient with Multiorgan failure and at high risk for cardiac arrest and death.    Corrin Parker, M.D.  [facility]  Medical Director [place] Director [facility] Cardio-Pulmonary Department

## 2019-11-27 ENCOUNTER — Encounter: Payer: Self-pay | Admitting: Cardiology

## 2019-11-27 ENCOUNTER — Inpatient Hospital Stay: Payer: 59

## 2019-11-27 LAB — GLUCOSE, CAPILLARY
Glucose-Capillary: 54 mg/dL — ABNORMAL LOW (ref 70–99)
Glucose-Capillary: 62 mg/dL — ABNORMAL LOW (ref 70–99)
Glucose-Capillary: 66 mg/dL — ABNORMAL LOW (ref 70–99)
Glucose-Capillary: 71 mg/dL (ref 70–99)
Glucose-Capillary: 72 mg/dL (ref 70–99)
Glucose-Capillary: 76 mg/dL (ref 70–99)
Glucose-Capillary: 79 mg/dL (ref 70–99)
Glucose-Capillary: 95 mg/dL (ref 70–99)

## 2019-11-27 LAB — COMPREHENSIVE METABOLIC PANEL
ALT: 46 U/L — ABNORMAL HIGH (ref 0–44)
AST: 70 U/L — ABNORMAL HIGH (ref 15–41)
Albumin: 2.4 g/dL — ABNORMAL LOW (ref 3.5–5.0)
Alkaline Phosphatase: 37 U/L — ABNORMAL LOW (ref 38–126)
Anion gap: 8 (ref 5–15)
BUN: 25 mg/dL — ABNORMAL HIGH (ref 8–23)
CO2: 29 mmol/L (ref 22–32)
Calcium: 7.4 mg/dL — ABNORMAL LOW (ref 8.9–10.3)
Chloride: 106 mmol/L (ref 98–111)
Creatinine, Ser: 0.89 mg/dL (ref 0.61–1.24)
GFR calc Af Amer: >60 mL/min (ref >60)
GFR calc non Af Amer: >60 mL/min (ref >60)
Glucose, Bld: 99 mg/dL (ref 70–99)
Potassium: 3 mmol/L — ABNORMAL LOW (ref 3.5–5.1)
Sodium: 143 mmol/L (ref 135–145)
Total Bilirubin: 0.9 mg/dL (ref 0.3–1.2)
Total Protein: 5.1 g/dL — ABNORMAL LOW (ref 6.5–8.1)

## 2019-11-27 LAB — CULTURE, RESPIRATORY W GRAM STAIN
Culture: NORMAL
Special Requests: NORMAL

## 2019-11-27 LAB — CBC WITH DIFFERENTIAL/PLATELET
Abs Immature Granulocytes: 0.09 10*3/uL — ABNORMAL HIGH (ref 0.00–0.07)
Basophils Absolute: 0 10*3/uL (ref 0.0–0.1)
Basophils Relative: 0 %
Eosinophils Absolute: 0 10*3/uL (ref 0.0–0.5)
Eosinophils Relative: 0 %
HCT: 31.7 % — ABNORMAL LOW (ref 39.0–52.0)
Hemoglobin: 10.5 g/dL — ABNORMAL LOW (ref 13.0–17.0)
Immature Granulocytes: 1 %
Lymphocytes Relative: 11 %
Lymphs Abs: 1.3 10*3/uL (ref 0.7–4.0)
MCH: 33.4 pg (ref 26.0–34.0)
MCHC: 33.1 g/dL (ref 30.0–36.0)
MCV: 101 fL — ABNORMAL HIGH (ref 80.0–100.0)
Monocytes Absolute: 0.9 10*3/uL (ref 0.1–1.0)
Monocytes Relative: 7 %
Neutro Abs: 9.8 10*3/uL — ABNORMAL HIGH (ref 1.7–7.7)
Neutrophils Relative %: 81 %
Platelets: 123 10*3/uL — ABNORMAL LOW (ref 150–400)
RBC: 3.14 MIL/uL — ABNORMAL LOW (ref 4.22–5.81)
RDW: 13.4 % (ref 11.5–15.5)
WBC: 12 10*3/uL — ABNORMAL HIGH (ref 4.0–10.5)
nRBC: 0 % (ref 0.0–0.2)

## 2019-11-27 LAB — BLOOD GAS, ARTERIAL
Acid-Base Excess: 6.4 mmol/L — ABNORMAL HIGH (ref 0.0–2.0)
Bicarbonate: 29.2 mmol/L — ABNORMAL HIGH (ref 20.0–28.0)
FIO2: 0.3
MECHVT: 550 mL
Mechanical Rate: 22
O2 Saturation: 97.7 %
PEEP: 5 cmH2O
Patient temperature: 37
pCO2 arterial: 35 mmHg (ref 32.0–48.0)
pH, Arterial: 7.53 — ABNORMAL HIGH (ref 7.350–7.450)
pO2, Arterial: 88 mmHg (ref 83.0–108.0)

## 2019-11-27 LAB — PATHOLOGIST SMEAR REVIEW

## 2019-11-27 MED ORDER — ADULT MULTIVITAMIN LIQUID CH
15.0000 mL | Freq: Every day | ORAL | Status: DC
  Administered 2019-11-27 – 2019-12-04 (×6): 15 mL
  Filled 2019-11-27 (×8): qty 15

## 2019-11-27 MED ORDER — DEXTROSE 50 % IV SOLN
12.5000 g | Freq: Once | INTRAVENOUS | Status: AC
  Administered 2019-11-27: 12.5 g via INTRAVENOUS

## 2019-11-27 MED ORDER — DEXTROSE 50 % IV SOLN: 1.0000 | Freq: Once | INTRAVENOUS | Status: AC

## 2019-11-27 MED ORDER — DEXTROSE 10 % IV SOLN: INTRAVENOUS | Status: DC

## 2019-11-27 MED ORDER — VITAL HIGH PROTEIN PO LIQD
1000.0000 mL | ORAL | Status: DC
  Administered 2019-11-27 – 2019-11-28 (×2): 1000 mL

## 2019-11-27 MED ORDER — PRO-STAT SUGAR FREE PO LIQD
60.0000 mL | Freq: Two times a day (BID) | ORAL | Status: DC
  Administered 2019-11-27 – 2019-11-29 (×5): 60 mL

## 2019-11-27 MED ORDER — DEXTROSE 50 % IV SOLN
INTRAVENOUS | Status: AC
  Administered 2019-11-28: 50 mL via INTRAVENOUS
  Filled 2019-11-27: qty 50

## 2019-11-27 MED ORDER — DEXTROSE 50 % IV SOLN
INTRAVENOUS | Status: AC
  Administered 2019-11-27: 50 mL via INTRAVENOUS
  Filled 2019-11-27: qty 50

## 2019-11-27 MED ORDER — POTASSIUM CHLORIDE 20 MEQ/15ML (10%) PO SOLN
30.0000 meq | ORAL | Status: AC
  Administered 2019-11-27 (×2): 30 meq
  Filled 2019-11-27 (×2): qty 30

## 2019-11-27 MED ORDER — DEXTROSE 5 % IV SOLN: INTRAVENOUS | Status: DC

## 2019-11-27 MED ORDER — ATROPINE SULFATE 1 MG/10ML IJ SOSY: 1.0000 mg | PREFILLED_SYRINGE | INTRAMUSCULAR | Status: DC | PRN

## 2019-11-27 NOTE — Progress Notes (Signed)
Initial Nutrition Assessment  DOCUMENTATION CODES:   Not applicable  INTERVENTION:  Initiate Vital High Protein at 20 mL/hr (480 mL goal daily volume) + Pro-Stat 60 mL BID per tube. Provides 880 kcal, 102 grams of protein, 403 mL H2O daily. With current propofol rate provides 1931 kcal daily.  Provide liquid MVI daily per tube.  Provide minimum free water flush of 30 mL Q4hrs to maintain tube patency.  NUTRITION DIAGNOSIS:   Inadequate oral intake related to inability to eat as evidenced by NPO status.  GOAL:   Provide needs based on ASPEN/SCCM guidelines  MONITOR:   Vent status, Labs, Weight trends, TF tolerance, I & O's  REASON FOR ASSESSMENT:   Ventilator    ASSESSMENT:   63 year old male with PMHx of COPD, ED, HTN admitted with acute cardiac arrest and acute STEMI with RCA occlusion associated with cocaine toxicity s/p PCI to proximal RCA, s/p 36C TTM protocol.   Patient is currently intubated on ventilator support MV: 9.8 L/min Temp (24hrs), Avg:97.9 F (36.6 C), Min:97.6 F (36.4 C), Max:98.3 F (36.8 C)  Propofol: 39.8 ml/hr (1051 kcal daily)  Medications reviewed and include: Novolog 0-15 units Q4hrs, famotidine, fentanyl gtt, Zosyn, propofol gtt.  Labs reviewed: CBG 72-79, Potassium 3, BUN 25.  Enteral Access: OGT  Discussed with RN and on rounds. Plan is to start tube feeds today. Will use weight of 82.9 kg from 12/24 to estimate needs. Weight has trended up since then likely related to fluid.  NUTRITION - FOCUSED PHYSICAL EXAM:    Most Recent Value  Orbital Region  No depletion  Upper Arm Region  No depletion  Thoracic and Lumbar Region  No depletion  Buccal Region  Unable to assess  Temple Region  No depletion  Clavicle Bone Region  No depletion  Clavicle and Acromion Bone Region  No depletion  Scapular Bone Region  Unable to assess  Dorsal Hand  No depletion  Patellar Region  No depletion  Anterior Thigh Region  No depletion  Posterior  Calf Region  No depletion  Edema (RD Assessment)  Mild  Hair  Reviewed  Eyes  Unable to assess  Mouth  Unable to assess  Skin  Reviewed  Nails  Reviewed     Diet Order:   Diet Order            Diet NPO time specified  Diet effective now             EDUCATION NEEDS:   No education needs have been identified at this time  Skin:  Skin Assessment: Reviewed RN Assessment  Last BM:  PTA  Height:   Ht Readings from Last 1 Encounters:  11/23/19 5\' 9"  (1.753 m)   Weight:   Wt Readings from Last 1 Encounters:  11/27/19 94.5 kg   Ideal Body Weight:  72.7 kg  BMI:  Body mass index is 30.77 kg/m.  Estimated Nutritional Needs:   Kcal:  [id number] (PSU 2003b w/ MSJ 1629, Ve 9.8, Tmax 36.8)  Protein:  100-124 grams (1.2-1.5 grams/kg)  Fluid:  2-2.3 L/day  Jacklynn Barnacle, MS, RD, LDN Office: [phone] Pager: [phone] After Hours/Weekend Pager: [phone]

## 2019-11-27 NOTE — Progress Notes (Signed)
eeg completed ° °

## 2019-11-27 NOTE — Procedures (Signed)
ELECTROENCEPHALOGRAM REPORT   Patient: Kurt [name]       Room #: IC07A-AA EEG No. ID: [id number] Age: 63 y.o.        Sex: male Referring Physician: Kasa Report Date:  11/27/2019        Interpreting Physician: Alexis Goodell  History: [name] is an 63 y.o. male s/p arrest  Medications:  Propofol, Fentanyl, Zosyn, Brilinta, Crestor, Insulin, ASA  Conditions of Recording:  This is a 21 channel routine scalp EEG performed with bipolar and monopolar montages arranged in accordance to the international 10/20 system of electrode placement. One channel was dedicated to EKG recording.  The patient is in the intubated and sedated state.  Description:  The waking background activity is discontinuous.  It consists of intermittent, generalized periods of attenuation lasting up to 15 seconds alternating with periods of higher voltage, disorganized activity.  This disorganized burst activity has an underlying slow rhythm consisting of polymorphic delta activity with superimposed faster rhythms.  The patient received noxious stimulation with no activation of the background noted.  Hyperventilation and iIntermittent photic stimulation were not performed.   IMPRESSION: This is an abnormal EEG due to a burst-suppression pattern seen throughout the tracing.  Burst suppression pattern can be seen in a variety of circumstances, including anesthesia (medicatoin effect), drug intoxication, hypothermia, as well as cerebral anoxia.  Clinical/neurological, and radiographic correlation advised.     Alexis Goodell, MD Neurology [phone] 11/27/2019, 2:58 PM

## 2019-11-27 NOTE — Progress Notes (Signed)
CRITICAL CARE PROGRESS NOTE    Name: Kurt [name] MRN: [id number] DOB: [birth year]     LOS: 4   SUBJECTIVE FINDINGS & SIGNIFICANT EVENTS   Patient description:   Pt with hx of polysubstance abuse,   Lines / Drains: PIVx2  Cultures / Sepsis markers: n/a  Antibiotics: zosyn   Protocols / Consultants: neurology  Tests / Events: EEG  Overnight: Aggitation with weaning of sedatives. Will initiate nourishment today via OGT   12/24-intubated/Vent support, cardiac arrest, s/p CATH-100% OCCLUSION OF RCA, COCAINE TOXICITY 12/24-HYPOTHERMIA PROTOCOL 12/24 - Critically ill, requiring 3 vasopressors, 100% FiO2 & 11PEEP 12/25 remains on pressors, on vent, fio2 100% 12/26 remains on hypothermia protocol 12/27 completed hypothermia protocol  PAST MEDICAL HISTORY   Past Medical History:  Diagnosis Date  . COPD (chronic obstructive pulmonary disease) ([place])   . ED (erectile dysfunction)   . Hypertension   . Lumbar disc disease      SURGICAL HISTORY   Past Surgical History:  Procedure Laterality Date  . APPENDECTOMY    . CORONARY STENT INTERVENTION N/A 11/23/2019   Procedure: CORONARY STENT INTERVENTION;  Surgeon: Yolonda Kida, MD;  Location: [facility];  Service: Cardiovascular;  Laterality: N/A;  RCA  . CORONARY/GRAFT ACUTE MI REVASCULARIZATION N/A 11/23/2019   Procedure: Coronary/Graft Acute MI Revascularization;  Surgeon: Yolonda Kida, MD;  Location: [facility];  Service: Cardiovascular;  Laterality: N/A;  . LEFT HEART CATH AND CORONARY ANGIOGRAPHY N/A 11/23/2019   Procedure: LEFT HEART CATH AND CORONARY ANGIOGRAPHY;  Surgeon: Yolonda Kida, MD;  Location: [facility];  Service: Cardiovascular;  Laterality: N/A;     FAMILY HISTORY   Family  History  Problem Relation Age of Onset  . Hyperlipidemia Father   . Cancer Father   . Heart disease Father        heart valve disease (from Agent Orange?)  . Alcohol abuse Mother   . Hypertension Neg Hx   . Diabetes Neg Hx   . Colon cancer Neg Hx   . Colon polyps Neg Hx   . Pancreatic cancer Neg Hx   . Rectal cancer Neg Hx   . Stomach cancer Neg Hx      SOCIAL HISTORY   Social History   Tobacco Use  . Smoking status: Current Every Day Smoker  . Smokeless tobacco: Never Used  Substance Use Topics  . Alcohol use: Yes  . Drug use: Yes    Types: Marijuana     MEDICATIONS   Current Medication:  Current Facility-Administered Medications:  .  0.9 %  sodium chloride infusion, , Intravenous, Continuous, Funke, Mary E, MD .  0.9 %  sodium chloride infusion, , Intravenous, Continuous, Kasa, Kurian, MD .  acetaminophen (TYLENOL) tablet 650 mg, 650 mg, Oral, Q4H PRN, Flora Lipps, MD .  acetaminophen (TYLENOL) tablet 650 mg, 650 mg, Oral, Q4H PRN, Callwood, Dwayne D, MD .  albuterol (PROVENTIL) (2.5 MG/3ML) 0.083% nebulizer solution 2.5 mg, 2.5 mg, Nebulization, Q2H PRN, Kasa, Kurian, MD .  aspirin chewable tablet 81 mg, 81 mg, Per Tube, Daily, Benita Gutter, RPH, 81 mg at 11/27/19 0905 .  atropine 1 MG/10ML injection 1 mg, 1 mg, Intravenous, PRN, Awilda Bill, NP .  chlorhexidine gluconate (MEDLINE KIT) (PERIDEX) 0.12 % solution 15 mL, 15 mL, Mouth Rinse, BID, [name] D, NP, 15 mL at 11/27/19 0907 .  Chlorhexidine Gluconate Cloth 2 % PADS 6 each, 6 each, Topical, Daily, [name],  NP, 6 each at 11/27/19 0905 .  famotidine (PEPCID) IVPB 20 mg premix, 20 mg, Intravenous, Q12H, Kasa, Kurian, MD, Last Rate: 100 mL/hr at 11/27/19 0904, 20 mg at 11/27/19 0904 .  fentaNYL (SUBLIMAZE) bolus via infusion 50 mcg, 50 mcg, Intravenous, Q15 min PRN, [name] D, NP, 50 mcg at 11/25/19 2220 .  fentaNYL (SUBLIMAZE) injection 50 mcg, 50 mcg, Intravenous, Once, [name] D, NP .  fentaNYL 25108mg in NS 2516m(1023mml) infusion-PREMIX, 50-200 mcg/hr, Intravenous, Continuous, KeeDarel Hong NP, Last Rate: 20 mL/hr at 11/27/19 0804, 200 mcg/hr at 11/27/19 0804 .  hydrocortisone sodium succinate (SOLU-CORTEF) 100 MG injection 50 mg, 50 mg, Intravenous, Q6H, KeeDarel Hong NP, 50 mg at 11/27/19 0905 .  insulin aspart (novoLOG) injection 0-15 Units, 0-15 Units, Subcutaneous, Q4H, KeeDarel Hong NP, 2 Units at 11/26/19 1628 .  ipratropium-albuterol (DUONEB) 0.5-2.5 (3) MG/3ML nebulizer solution 3 mL, 3 mL, Nebulization, Q4H, Kasa, Kurian, MD, 3 mL at 11/27/19 0752 .  MEDLINE mouth rinse, 15 mL, Mouth Rinse, 10 times per day, KeeDarel Hong NP, 15 mL at 11/27/19 0906 .  midazolam (VERSED) injection 2 mg, 2 mg, Intravenous, Q2H PRN, KeeDarel Hong NP, 2 mg at 11/26/19 0144 .  norepinephrine (LEVOPHED) 16 mg in 250m70memix infusion, 0-40 mcg/min, Intravenous, Titrated, KeenBradly Bienenstock, Stopped at 11/26/19 1308 .  ondansetron (ZOFRAN) injection 4 mg, 4 mg, Intravenous, Q6H PRN, Callwood, Dwayne D, MD .  piperacillin-tazobactam (ZOSYN) IVPB 3.375 g, 3.375 g, Intravenous, Q8H, KeenDarel HongNP, Last Rate: 12.5 mL/hr at 11/27/19 0600, Rate Verify at 11/27/19 0600 .  propofol (DIPRIVAN) 1000 MG/100ML infusion, 5-80 mcg/kg/min, Intravenous, Titrated, KeenDarel HongNP, Last Rate: 20 mL/hr at 11/27/19 0942, 40.209 mcg/kg/min at 11/27/19 0942 .  rosuvastatin (CRESTOR) tablet 40 mg, 40 mg, Oral, q1800, Callwood, Dwayne D, MD, 40 mg at 11/26/19 1704 .  sodium chloride flush (NS) 0.9 % injection 3 mL, 3 mL, Intravenous, PRN, Callwood, Dwayne D, MD .  ticagrelor (BRILINTA) tablet 90 mg, 90 mg, Per Tube, BID, ChapBenita GutterH, 90 mg at 11/27/19 0905 .  vecuronium (NORCURON) injection 10 mg, 10 mg, Intravenous, Q2H PRN, KeenDarel HongNP, 10 mg at 11/24/19 2306    ALLERGIES   Patient has no known allergies.    REVIEW OF SYSTEMS      Unable to obtain due to sedation   PHYSICAL EXAMINATION   Vital Signs: Temp:  [97.6 F (36.4 C)-98.3 F (36.8 C)] 97.8 F (36.6 C) (12/28 0900) Pulse Rate:  [49-66] 58 (12/28 0900) Resp:  [17-22] 18 (12/28 0900) BP: (96-126)/(64-84) 112/69 (12/27 2000) SpO2:  [92 %-100 %] 100 % (12/28 0900) Arterial Line BP: (111-180)/(57-94) 140/76 (12/28 0900) FiO2 (%):  [30 %] 30 % (12/28 0752) Weight:  [94.5 kg] 94.5 kg (12/28 0423)  GENERAL:chronically ill appearing HEAD: Normocephalic, atraumatic.  EYES: Pupils equal, round, reactive to light.  No scleral icterus.  MOUTH: Moist mucosal membrane. NECK: Supple. No thyromegaly. No nodules. No JVD.  PULMONARY: Bibasilar crackles  CARDIOVASCULAR: S1 and S2. Regular rate and rhythm. No murmurs, rubs, or gallops.  GASTROINTESTINAL: Soft, nontender, non-distended. No masses. Positive bowel sounds. No hepatosplenomegaly.  MUSCULOSKELETAL: No swelling, clubbing, or edema.  NEUROLOGIC: Mild distress due to acute illness SKIN:intact,warm,dry   PERTINENT DATA     Infusions: . sodium chloride    . sodium chloride    . famotidine (PEPCID) IV 20 mg (11/27/19 0904)  . fentaNYL infusion INTRAVENOUS  200 mcg/hr (11/27/19 0804)  . norepinephrine (LEVOPHED) Adult infusion Stopped (11/26/19 1308)  . piperacillin-tazobactam (ZOSYN)  IV 12.5 mL/hr at 11/27/19 0600  . propofol (DIPRIVAN) infusion 40.209 mcg/kg/min (11/27/19 0942)   Scheduled Medications: . aspirin  81 mg Per Tube Daily  . chlorhexidine gluconate (MEDLINE KIT)  15 mL Mouth Rinse BID  . Chlorhexidine Gluconate Cloth  6 each Topical Daily  . fentaNYL (SUBLIMAZE) injection  50 mcg Intravenous Once  . hydrocortisone sod succinate (SOLU-CORTEF) inj  50 mg Intravenous Q6H  . insulin aspart  0-15 Units Subcutaneous Q4H  . ipratropium-albuterol  3 mL Nebulization Q4H  . mouth rinse  15 mL Mouth Rinse 10 times per day  . rosuvastatin  40 mg Oral q1800  . ticagrelor  90 mg Per Tube BID    PRN Medications: acetaminophen, acetaminophen, albuterol, atropine, fentaNYL, midazolam, ondansetron (ZOFRAN) IV, sodium chloride flush, vecuronium Hemodynamic parameters: CVP:  [12 mmHg-20 mmHg] 17 mmHg Intake/Output: 12/27 0701 - 12/28 0700 In: 1464.4 [P.O.:60; I.V.:1127.1; IV Piggyback:277.2] Out: 770 [Urine:770]  Ventilator  Settings: Vent Mode: PRVC FiO2 (%):  [30 %] 30 % Set Rate:  [18 bmp-22 bmp] 18 bmp Vt Set:  [550 mL] 550 mL PEEP:  [5 cmH20] 5 cmH20 Plateau Pressure:  [20 cmH20-24 cmH20] 21 cmH20   Other Labs:     LAB RESULTS:  Basic Metabolic Panel: Recent Labs  Lab 11/23/19 1624 11/24/19 0051 11/24/19 0838 11/25/19 0418 11/26/19 0422 11/27/19 0403  NA  --  142 143 143 145 143  K  --  5.8* 4.2 3.2* 3.0* 3.0*  CL  --  113* 110 103 104 106  CO2  --  20* 24 25 32 29  GLUCOSE  --  207* 140* 150* 119* 99  BUN  --  28* 34* 28* 24* 25*  CREATININE  --  1.40* 1.44* 1.26* 0.99 0.89  CALCIUM  --  6.1* 6.9* 6.6* 7.0* 7.4*  MG 2.3  --   --  1.8  --   --    Liver Function Tests: Recent Labs  Lab 11/23/19 1627 11/24/19 0051 11/25/19 0418 11/26/19 0422 11/27/19 0403  AST 65* 143* 167* 105* 70*  ALT 42 56* 65* 53* 46*  ALKPHOS 95 53 41 36* 37*  BILITOT 0.7 0.7 0.7 0.9 0.9  PROT 6.6 5.3* 4.9* 4.9* 5.1*  ALBUMIN 3.3* 2.7* 2.6* 2.5* 2.4*   No results for input(s): LIPASE, AMYLASE in the last 168 hours. No results for input(s): AMMONIA in the last 168 hours. CBC: Recent Labs  Lab 11/23/19 1627 11/23/19 2314 11/24/19 0051 11/25/19 0418 11/26/19 0422 11/27/19 0403  WBC 20.6* 28.8* 28.0* 20.1* 14.8* 12.0*  NEUTROABS 13.4*  --   --  17.3* 13.0* 9.8*  HGB 16.8 15.1 15.6 12.7* 10.6* 10.5*  HCT 50.1 48.3 47.4 37.5* 32.5* 31.7*  MCV 100.0 105.0* 101.5* 98.2 100.6* 101.0*  PLT 195 200 207 133* 117* 123*   Cardiac Enzymes: No results for input(s): CKTOTAL, CKMB, CKMBINDEX, TROPONINI in the last 168 hours. BNP: Invalid input(s): POCBNP CBG: Recent Labs   Lab 11/26/19 1621 11/26/19 1931 11/26/19 2333 11/27/19 0401 11/27/19 0741  GLUCAP 122* 111* 95 95 79     IMAGING RESULTS:  Imaging: DG Chest Port 1 View  Result Date: 11/27/2019 CLINICAL DATA:  Acute respiratory failure. EXAM: PORTABLE CHEST 1 VIEW COMPARISON:  Chest x-ray 11/23/2019 FINDINGS: The endotracheal tube is 2.7 cm above the carina. The NG tube is coursing down the esophagus and into the stomach. The  right IJ central venous catheter is stable with its tip in the mid SVC. The cardiac silhouette, mediastinal and hilar contours are within normal limits and stable. The external pacer paddles have been removed. Slight improved lung aeration. There is persistent interstitial prominence which could reflect mild edema but no infiltrates or effusions. IMPRESSION: 1. Support apparatus in good position, unchanged. 2. Interstitial prominence could suggest mild edema but overall improved aeration. Electronically Signed   By: Marijo Sanes M.D.   On: 11/27/2019 07:16      ASSESSMENT AND PLAN      63 yo white male with acute and severe hypoxic respiratory failure from acute V fib cardiac arrest and sudden cardiac deathSTEMI from acute RCA OCCLUSION andfrom COCAINE Toxicity. Now with Cardiogenic shock requiring multiple vasopressors. Concern for possible aspiration pneumonia.   Severe acute hypoxic and hypercapnic respiratory failure Continue full mechanical support Continue BD therapy Wean FiO2 and PEEP as tolerated  Acute MI and cardiac failure-STEMI and cocaine abuse Follow-up cardiology recommendations Completed hypothermia protocol Antiplatelet agent therapy as per cardiology  Acute kidney injury and renal failure Follow Chem-7 Follow urine output Foley as needed Avoid nephrotoxic agents   Neurology Completed hypothermia protocol Plan for sedation vacation and assess neurological status We will need to consider CT or MRI for further  assessment   Cardiac Acute MI Follow cardiology recommendations Continue ICU monitoring  Infectious disease Continue antibiotics for aspiration pneumonia   GI GI PROPHYLAXIS as indicated  NUTRITIONAL STATUS DIET-->TF's as tolerated Constipation protocol as indicated  ELECTROLYTES -follow labs as needed -replace as needed -pharmacy consultation and following   ENDO - will use ICU hypoglycemic\Hyperglycemia protocol if indicated    DVT/GI PRX ordered TRANSFUSIONS AS NEEDED MONITOR FSBS ASSESS the need for LABS as needed  S as needed   Critical care provider statement:    Critical care time (minutes):  32   Critical care time was exclusive of:  Separately billable procedures and treating other patients   Critical care was necessary to treat or prevent imminent or life-threatening deterioration of the following conditions:   Acute hypoxemic hypercapnic respiratory failure, cardiac arrest, cocaine abuse, acute kidney injury, multiple comorbid conditions   Critical care was time spent personally by me on the following activities:  Development of treatment plan with patient or surrogate, discussions with consultants, evaluation of patient's response to treatment, examination of patient, obtaining history from patient or surrogate, ordering and performing treatments and interventions, ordering and review of laboratory studies and re-evaluation of patient's condition.  I assumed direction of critical care for this patient from another provider in my specialty: no    This document was prepared using Dragon voice recognition software and may include unintentional dictation errors.    Ottie Glazier, M.D.  Division of [place]

## 2019-11-27 NOTE — Progress Notes (Signed)
[facility]  ROUNDING NOTE   Subjective:   Patient is a sedated currently with propofol and fentanyl.  He is ventilator dependent FiO2 30% Has a NG tube that is clamped with green bile colored fluid Urine output is good. 12/27 0701 - 12/28 0700 In: 1464.4 [P.O.:60; I.V.:1127.1; IV Piggyback:277.2] Out: 770 [Urine:770]   Objective:  Vital signs in last 24 hours:  Temp:  [97.6 F (36.4 C)-98.5 F (36.9 C)] 98 F (36.7 C) (12/28 0400) Pulse Rate:  [49-82] 52 (12/28 0600) Resp:  [17-24] 18 (12/28 0600) BP: (96-126)/(64-84) 112/69 (12/27 2000) SpO2:  [92 %-99 %] 99 % (12/28 0600) Arterial Line BP: (111-180)/(57-94) 153/83 (12/28 0600) FiO2 (%):  [30 %] 30 % (12/28 0600) Weight:  [94.5 kg] 94.5 kg (12/28 0423)  Weight change: 2.6 kg Filed Weights   11/25/19 0500 11/26/19 0500 11/27/19 0423  Weight: 93.7 kg 91.9 kg 94.5 kg    Intake/Output: I/O last 3 completed shifts: In: 2906.3 [P.O.:60; I.V.:2444.7; IV Piggyback:401.7] Out: 1270 [Urine:1220; Emesis/NG output:50]   Intake/Output this shift:  No intake/output data recorded.  Physical Exam: General: Critically ill  Head: ETT  Eyes: Anicteric, pupils are small and round  Lungs:  PRVC FiO2 30%  Heart: Regular rate and rhythm, sinus, bradycardic  Abdomen:  Soft, nontender, bowel sounds are present  Extremities:  trace peripheral edema.  Neurologic: Intubated and sedated  Skin: No lesions    Foley catheter present    Basic Metabolic Panel: Recent Labs  Lab 11/23/19 1624 11/24/19 0051 11/24/19 0838 11/25/19 0418 11/26/19 0422 11/27/19 0403  NA  --  142 143 143 145 143  K  --  5.8* 4.2 3.2* 3.0* 3.0*  CL  --  113* 110 103 104 106  CO2  --  20* 24 25 32 29  GLUCOSE  --  207* 140* 150* 119* 99  BUN  --  28* 34* 28* 24* 25*  CREATININE  --  1.40* 1.44* 1.26* 0.99 0.89  CALCIUM  --  6.1* 6.9* 6.6* 7.0* 7.4*  MG 2.3  --   --  1.8  --   --     Liver Function Tests: Recent Labs  Lab 11/23/19 1627  11/24/19 0051 11/25/19 0418 11/26/19 0422 11/27/19 0403  AST 65* 143* 167* 105* 70*  ALT 42 56* 65* 53* 46*  ALKPHOS 95 53 41 36* 37*  BILITOT 0.7 0.7 0.7 0.9 0.9  PROT 6.6 5.3* 4.9* 4.9* 5.1*  ALBUMIN 3.3* 2.7* 2.6* 2.5* 2.4*   No results for input(s): LIPASE, AMYLASE in the last 168 hours. No results for input(s): AMMONIA in the last 168 hours.  CBC: Recent Labs  Lab 11/23/19 1627 11/23/19 2314 11/24/19 0051 11/25/19 0418 11/26/19 0422 11/27/19 0403  WBC 20.6* 28.8* 28.0* 20.1* 14.8* 12.0*  NEUTROABS 13.4*  --   --  17.3* 13.0* 9.8*  HGB 16.8 15.1 15.6 12.7* 10.6* 10.5*  HCT 50.1 48.3 47.4 37.5* 32.5* 31.7*  MCV 100.0 105.0* 101.5* 98.2 100.6* 101.0*  PLT 195 200 207 133* 117* 123*    Cardiac Enzymes: No results for input(s): CKTOTAL, CKMB, CKMBINDEX, TROPONINI in the last 168 hours.  BNP: Invalid input(s): POCBNP  CBG: Recent Labs  Lab 11/26/19 1621 11/26/19 1931 11/26/19 2333 11/27/19 0401 11/27/19 0741  GLUCAP 122* 111* 95 95 79    Microbiology: Results for orders placed or performed during the hospital encounter of 11/23/19  Respiratory Panel by RT PCR (Flu A&B, Covid) - Nasopharyngeal Swab  Status: None   Collection Time: 11/23/19  4:24 PM   Specimen: Nasopharyngeal Swab  Result Value Ref Range Status   SARS Coronavirus 2 by RT PCR NEGATIVE NEGATIVE Final    Comment: (NOTE) SARS-CoV-2 target nucleic acids are NOT DETECTED. The SARS-CoV-2 RNA is generally detectable in upper respiratoy specimens during the acute phase of infection. The lowest concentration of SARS-CoV-2 viral copies this assay can detect is 131 copies/mL. A negative result does not preclude SARS-Cov-2 infection and should not be used as the sole basis for treatment or other patient management decisions. A negative result may occur with  improper specimen collection/handling, submission of specimen other than nasopharyngeal swab, presence of viral mutation(s) within the areas  targeted by this assay, and inadequate number of viral copies (<131 copies/mL). A negative result must be combined with clinical observations, patient history, and epidemiological information. The expected result is Negative. Fact Sheet for Patients:  PinkCheek.be Fact Sheet for Healthcare Providers:  GravelBags.it This test is not yet ap proved or cleared by the [place] FDA and  has been authorized for detection and/or diagnosis of SARS-CoV-2 by FDA under an Emergency Use Authorization (EUA). This EUA will remain  in effect (meaning this test can be used) for the duration of the COVID-19 declaration under Section 564(b)(1) of the Act, 21 U.S.C. section 360bbb-3(b)(1), unless the authorization is terminated or revoked sooner.    Influenza A by PCR NEGATIVE NEGATIVE Final   Influenza B by PCR NEGATIVE NEGATIVE Final    Comment: (NOTE) The Xpert Xpress SARS-CoV-2/FLU/RSV assay is intended as an aid in  the diagnosis of influenza from Nasopharyngeal swab specimens and  should not be used as a sole basis for treatment. Nasal washings and  aspirates are unacceptable for Xpert Xpress SARS-CoV-2/FLU/RSV  testing. Fact Sheet for Patients: PinkCheek.be Fact Sheet for Healthcare Providers: GravelBags.it This test is not yet approved or cleared by the [place] FDA and  has been authorized for detection and/or diagnosis of SARS-CoV-2 by  FDA under an Emergency Use Authorization (EUA). This EUA will remain  in effect (meaning this test can be used) for the duration of the  Covid-19 declaration under Section 564(b)(1) of the Act, 21  U.S.C. section 360bbb-3(b)(1), unless the authorization is  terminated or revoked. Performed at [facility], [address], [place], [place] [address]   Urine Culture     Status: None   Collection Time: 11/23/19  4:27 PM    Specimen: Urine, Random  Result Value Ref Range Status   Specimen Description   Final    URINE, RANDOM Performed at [facility] [address] [place], [place] [address]    Special Requests   Final    Normal Performed at [facility], [address] [place], [place] [address]    Culture   Final    NO GROWTH Performed at [facility], [place] [address] [place],  [address]    Report Status 11/25/2019 FINAL  Final  MRSA PCR Screening     Status: None   Collection Time: 11/23/19  9:44 PM   Specimen: Nasopharyngeal  Result Value Ref Range Status   MRSA by PCR NEGATIVE NEGATIVE Final    Comment:        The GeneXpert MRSA Assay (FDA approved for NASAL specimens only), is one component of a comprehensive MRSA colonization surveillance program. It is not intended to diagnose MRSA infection nor to guide or monitor treatment for MRSA infections. Performed  at [facility], [address] [place], [place] [address]   CULTURE, BLOOD (ROUTINE X 2) w Reflex to ID Panel     Status: None (Preliminary result)   Collection Time: 11/24/19  4:03 AM   Specimen: BLOOD  Result Value Ref Range Status   Specimen Description BLOOD LEFT UPPER ARM  Final   Special Requests   Final    BOTTLES DRAWN AEROBIC AND ANAEROBIC Blood Culture adequate volume   Culture   Final    NO GROWTH 3 DAYS Performed at [facility], [address], [place], [place] [address]    Report Status PENDING  Incomplete  CULTURE, BLOOD (ROUTINE X 2) w Reflex to ID Panel     Status: None (Preliminary result)   Collection Time: 11/24/19  4:03 AM   Specimen: BLOOD  Result Value Ref Range Status   Specimen Description BLOOD BLOOD LEFT FOREARM  Final   Special Requests   Final    BOTTLES DRAWN AEROBIC ONLY Blood Culture results may not be optimal due to an inadequate volume of blood received in culture bottles   Culture   Final    NO GROWTH 3 DAYS Performed at [facility], [address] [place], [place] [address]    Report Status PENDING  Incomplete  Culture, respiratory (non-expectorated)     Status: None   Collection Time: 11/25/19  4:48 AM   Specimen: Tracheal Aspirate; Respiratory  Result Value Ref Range Status   Specimen Description   Final    TRACHEAL ASPIRATE Performed at [facility], [address] [place], [place] [address]    Special Requests   Final    Normal Performed at [facility], [place], [place] [address]    Gram Stain   Final    RARE WBC PRESENT, PREDOMINANTLY MONONUCLEAR RARE GRAM POSITIVE COCCI IN PAIRS    Culture   Final    RARE Consistent with normal respiratory flora. Performed at [facility], [place] [address], [place], [place] [address]    Report Status 11/27/2019 FINAL  Final    Coagulation Studies: No results for input(s): LABPROT, INR in the last 72 hours.  Urinalysis: No results for input(s): COLORURINE, LABSPEC, PHURINE, GLUCOSEU, HGBUR, BILIRUBINUR, KETONESUR, PROTEINUR, UROBILINOGEN, NITRITE, LEUKOCYTESUR in the last 72 hours.  Invalid input(s): APPERANCEUR    Imaging: DG Chest Port 1 View  Result Date: 11/27/2019 CLINICAL DATA:  Acute respiratory failure. EXAM: PORTABLE CHEST 1 VIEW COMPARISON:  Chest x-ray 11/23/2019 FINDINGS: The endotracheal tube is 2.7 cm above the carina. The NG tube is coursing down the esophagus and into the stomach. The right IJ central venous catheter is stable with its tip in the mid SVC. The cardiac silhouette, mediastinal and hilar contours are within normal limits and stable. The external pacer paddles have been removed. Slight improved lung aeration. There is persistent interstitial prominence which could reflect mild edema but no infiltrates or effusions. IMPRESSION: 1. Support apparatus in good position, unchanged. 2. Interstitial prominence could suggest mild edema but overall improved aeration. Electronically Signed    By: Marijo Sanes M.D.   On: 11/27/2019 07:16     Medications:   . sodium chloride    . sodium chloride    . famotidine (PEPCID) IV Stopped (11/26/19 2301)  . fentaNYL infusion INTRAVENOUS 200 mcg/hr (11/27/19 0804)  . norepinephrine (LEVOPHED) Adult infusion Stopped (11/26/19 1308)  . piperacillin-tazobactam (ZOSYN)  IV 12.5 mL/hr at 11/27/19 0600  .  propofol (DIPRIVAN) infusion 60 mcg/kg/min (11/27/19 0600)   . aspirin  81 mg Per Tube Daily  . chlorhexidine gluconate (MEDLINE KIT)  15 mL Mouth Rinse BID  . Chlorhexidine Gluconate Cloth  6 each Topical Daily  . fentaNYL (SUBLIMAZE) injection  50 mcg Intravenous Once  . hydrocortisone sod succinate (SOLU-CORTEF) inj  50 mg Intravenous Q6H  . insulin aspart  0-15 Units Subcutaneous Q4H  . ipratropium-albuterol  3 mL Nebulization Q4H  . mouth rinse  15 mL Mouth Rinse 10 times per day  . potassium chloride  30 mEq Per Tube Q4H  . rosuvastatin  40 mg Oral q1800  . ticagrelor  90 mg Per Tube BID   acetaminophen, acetaminophen, albuterol, atropine, fentaNYL, midazolam, ondansetron (ZOFRAN) IV, sodium chloride flush, vecuronium  Assessment/ Plan:  Mr. Kurt [name] is a 63 y.o. white male with COPD, hypertension, substance , who was admitted to [facility] on 11/23/2019 for STEMI  1. Acute renal failure with metabolic acidosis and hyperkalemia: now with hypokalemia requiring replacement.  Baseline creatinine of 1.02, normal GFR on 03/16/2018.  Nonoliguric urine output Urine with glycosuria, hematuria and proteinuria. Cardiac catheterization with IV dye exposure on 12/24.  - holding lisinoporil - Discontinued Lokelma -Urine output and serum creatinine has improved. We will sign off. please reconsult as necessary.     LOS: 4 Irina Okelly 12/28/20208:59 AM

## 2019-11-28 LAB — CBC WITH DIFFERENTIAL/PLATELET
Abs Immature Granulocytes: 0.12 10*3/uL — ABNORMAL HIGH (ref 0.00–0.07)
Basophils Absolute: 0 10*3/uL (ref 0.0–0.1)
Basophils Relative: 0 %
Eosinophils Absolute: 0.1 10*3/uL (ref 0.0–0.5)
Eosinophils Relative: 1 %
HCT: 32.6 % — ABNORMAL LOW (ref 39.0–52.0)
Hemoglobin: 10.4 g/dL — ABNORMAL LOW (ref 13.0–17.0)
Immature Granulocytes: 1 %
Lymphocytes Relative: 24 %
Lymphs Abs: 2.6 10*3/uL (ref 0.7–4.0)
MCH: 33 pg (ref 26.0–34.0)
MCHC: 31.9 g/dL (ref 30.0–36.0)
MCV: 103.5 fL — ABNORMAL HIGH (ref 80.0–100.0)
Monocytes Absolute: 0.9 10*3/uL (ref 0.1–1.0)
Monocytes Relative: 8 %
Neutro Abs: 6.9 10*3/uL (ref 1.7–7.7)
Neutrophils Relative %: 66 %
Platelets: 121 10*3/uL — ABNORMAL LOW (ref 150–400)
RBC: 3.15 MIL/uL — ABNORMAL LOW (ref 4.22–5.81)
RDW: 13.6 % (ref 11.5–15.5)
WBC: 10.5 10*3/uL (ref 4.0–10.5)
nRBC: 0 % (ref 0.0–0.2)

## 2019-11-28 LAB — GLUCOSE, CAPILLARY
Glucose-Capillary: 76 mg/dL (ref 70–99)
Glucose-Capillary: 57 mg/dL — ABNORMAL LOW (ref 70–99)
Glucose-Capillary: 88 mg/dL (ref 70–99)
Glucose-Capillary: 38 mg/dL — CL (ref 70–99)
Glucose-Capillary: 35 mg/dL — CL (ref 70–99)
Glucose-Capillary: 41 mg/dL — CL (ref 70–99)
Glucose-Capillary: 53 mg/dL — ABNORMAL LOW (ref 70–99)
Glucose-Capillary: 203 mg/dL — ABNORMAL HIGH (ref 70–99)
Glucose-Capillary: 43 mg/dL — CL (ref 70–99)
Glucose-Capillary: 52 mg/dL — ABNORMAL LOW (ref 70–99)
Glucose-Capillary: 64 mg/dL — ABNORMAL LOW (ref 70–99)
Glucose-Capillary: 116 mg/dL — ABNORMAL HIGH (ref 70–99)
Glucose-Capillary: 105 mg/dL — ABNORMAL HIGH (ref 70–99)
Glucose-Capillary: 81 mg/dL (ref 70–99)
Glucose-Capillary: 139 mg/dL — ABNORMAL HIGH (ref 70–99)

## 2019-11-28 LAB — MAGNESIUM: Magnesium: 2.3 mg/dL (ref 1.7–2.4)

## 2019-11-28 LAB — COMPREHENSIVE METABOLIC PANEL
ALT: 39 U/L (ref 0–44)
AST: 45 U/L — ABNORMAL HIGH (ref 15–41)
Albumin: 2.3 g/dL — ABNORMAL LOW (ref 3.5–5.0)
Alkaline Phosphatase: 36 U/L — ABNORMAL LOW (ref 38–126)
Anion gap: 5 (ref 5–15)
BUN: 28 mg/dL — ABNORMAL HIGH (ref 8–23)
CO2: 30 mmol/L (ref 22–32)
Calcium: 7.7 mg/dL — ABNORMAL LOW (ref 8.9–10.3)
Chloride: 109 mmol/L (ref 98–111)
Creatinine, Ser: 0.75 mg/dL (ref 0.61–1.24)
GFR calc Af Amer: >60 mL/min (ref >60)
GFR calc non Af Amer: >60 mL/min (ref >60)
Glucose, Bld: 94 mg/dL (ref 70–99)
Potassium: 3.2 mmol/L — ABNORMAL LOW (ref 3.5–5.1)
Sodium: 144 mmol/L (ref 135–145)
Total Bilirubin: 0.7 mg/dL (ref 0.3–1.2)
Total Protein: 4.9 g/dL — ABNORMAL LOW (ref 6.5–8.1)

## 2019-11-28 LAB — PHOSPHORUS: Phosphorus: 3.1 mg/dL (ref 2.5–4.6)

## 2019-11-28 MED ORDER — SODIUM CHLORIDE 0.9 % IV SOLN
INTRAVENOUS | Status: DC
  Filled 2019-11-28: qty 500

## 2019-11-28 MED ORDER — ROSUVASTATIN CALCIUM 20 MG PO TABS
40.0000 mg | ORAL_TABLET | Freq: Every day | ORAL | Status: DC
  Administered 2019-11-29 – 2019-11-30 (×2): 40 mg
  Filled 2019-11-28: qty 2
  Filled 2019-11-28: qty 4
  Filled 2019-11-28 (×2): qty 2
  Filled 2019-11-28: qty 4
  Filled 2019-11-28: qty 2

## 2019-11-28 MED ORDER — DEXTROSE 50 % IV SOLN
25.0000 g | Freq: Once | INTRAVENOUS | Status: AC
  Administered 2019-11-28: 25 g via INTRAVENOUS

## 2019-11-28 MED ORDER — POTASSIUM CHLORIDE 20 MEQ/15ML (10%) PO SOLN
30.0000 meq | ORAL | Status: AC
  Administered 2019-11-28: 30 meq
  Filled 2019-11-28 (×2): qty 30

## 2019-11-28 MED ORDER — DEXTROSE 50 % IV SOLN
INTRAVENOUS | Status: AC
  Administered 2019-11-28: 50 mL
  Filled 2019-11-28: qty 50

## 2019-11-28 MED ORDER — SODIUM CHLORIDE 0.9 % IV SOLN
INTRAVENOUS | Status: DC
  Filled 2019-11-28 (×2): qty 180

## 2019-11-28 MED ORDER — METHYLPREDNISOLONE SODIUM SUCC 125 MG IJ SOLR
60.0000 mg | Freq: Once | INTRAMUSCULAR | Status: AC
  Administered 2019-11-28: 13:00:00 60 mg via INTRAVENOUS
  Filled 2019-11-28: qty 2

## 2019-11-28 MED ORDER — DEXTROSE 50 % IV SOLN
INTRAVENOUS | Status: AC
  Filled 2019-11-28: qty 50

## 2019-11-28 MED ORDER — SODIUM CHLORIDE 0.9 % IV SOLN
INTRAVENOUS | Status: DC
  Filled 2019-11-28 (×4): qty 640

## 2019-11-28 MED ORDER — DEXTROSE 50 % IV SOLN
1.0000 | Freq: Once | INTRAVENOUS | Status: AC
  Administered 2019-12-04: 50 mL via INTRAVENOUS
  Filled 2019-11-28: qty 50

## 2019-11-28 MED ORDER — DEXTROSE 50 % IV SOLN: 12.5000 g | Freq: Once | INTRAVENOUS | Status: DC

## 2019-11-28 NOTE — Progress Notes (Signed)
CRITICAL CARE PROGRESS NOTE    Name: Kurt [name] MRN: [id number] DOB: 09/07/1956     LOS: 5   SUBJECTIVE FINDINGS & SIGNIFICANT EVENTS   Patient description:   Pt with hx of polysubstance abuse,   Lines / Drains: PIVx2  Cultures / Sepsis markers: n/a  Antibiotics: zosyn   Protocols / Consultants: neurology  Tests / Events: EEG  Overnight: Aggitation with weaning of sedatives. Will initiate nourishment today via OGT   12/24-intubated/Vent support, cardiac arrest, s/p CATH-100% OCCLUSION OF RCA, COCAINE TOXICITY 12/24-HYPOTHERMIA PROTOCOL 12/24 - Critically ill, requiring 3 vasopressors, 100% FiO2 & 11PEEP 12/25 remains on pressors, on vent, fio2 100% 12/26 remains on hypothermia protocol 12/27 completed hypothermia protocol 12/28- remains with high fiO2 requirement and difficulty to wean 12/29 - recruitment maneuvers done, remains hypoxemic poorly responsive with weaning of sedation.  Discussed with son at bedside today.   PAST MEDICAL HISTORY   Past Medical History:  Diagnosis Date  . COPD (chronic obstructive pulmonary disease) ([place])   . ED (erectile dysfunction)   . Hypertension   . Lumbar disc disease      SURGICAL HISTORY   Past Surgical History:  Procedure Laterality Date  . APPENDECTOMY    . CORONARY STENT INTERVENTION N/A 11/23/2019   Procedure: CORONARY STENT INTERVENTION;  Surgeon: Yolonda Kida, MD;  Location: [facility];  Service: Cardiovascular;  Laterality: N/A;  RCA  . CORONARY/GRAFT ACUTE MI REVASCULARIZATION N/A 11/23/2019   Procedure: Coronary/Graft Acute MI Revascularization;  Surgeon: Yolonda Kida, MD;  Location: [facility];  Service: Cardiovascular;  Laterality: N/A;  . LEFT HEART CATH AND CORONARY ANGIOGRAPHY N/A  11/23/2019   Procedure: LEFT HEART CATH AND CORONARY ANGIOGRAPHY;  Surgeon: Yolonda Kida, MD;  Location: [facility];  Service: Cardiovascular;  Laterality: N/A;     FAMILY HISTORY   Family History  Problem Relation Age of Onset  . Hyperlipidemia Father   . Cancer Father   . Heart disease Father        heart valve disease (from Agent Orange?)  . Alcohol abuse Mother   . Hypertension Neg Hx   . Diabetes Neg Hx   . Colon cancer Neg Hx   . Colon polyps Neg Hx   . Pancreatic cancer Neg Hx   . Rectal cancer Neg Hx   . Stomach cancer Neg Hx      SOCIAL HISTORY   Social History   Tobacco Use  . Smoking status: Current Every Day Smoker  . Smokeless tobacco: Never Used  Substance Use Topics  . Alcohol use: Yes  . Drug use: Yes    Types: Marijuana     MEDICATIONS   Current Medication:  Current Facility-Administered Medications:  .  0.9 %  sodium chloride infusion, , Intravenous, Continuous, Funke, Mary E, MD .  0.9 %  sodium chloride infusion, , Intravenous, Continuous, Kasa, Kurian, MD .  acetaminophen (TYLENOL) tablet 650 mg, 650 mg, Oral, Q4H PRN, Flora Lipps, MD .  acetaminophen (TYLENOL) tablet 650 mg, 650 mg, Oral, Q4H PRN, Callwood, Dwayne D, MD .  albuterol (PROVENTIL) (2.5 MG/3ML) 0.083% nebulizer solution 2.5 mg, 2.5 mg, Nebulization, Q2H PRN, Kasa, Kurian, MD .  aspirin chewable tablet 81 mg, 81 mg, Per Tube, Daily, Benita Gutter, RPH, 81 mg at 11/28/19 0853 .  atropine 1 MG/10ML injection 1 mg, 1 mg, Intravenous, PRN, Awilda Bill, NP .  chlorhexidine gluconate (MEDLINE KIT) (PERIDEX) 0.12 % solution 15 mL,  15 mL, Mouth Rinse, BID, [name] D, NP, 15 mL at 11/28/19 0855 .  Chlorhexidine Gluconate Cloth 2 % PADS 6 each, 6 each, Topical, Daily, [name], NP, 6 each at 11/27/19 [phone] .  dextrose 10 % infusion, , Intravenous, Continuous, Jalia Zuniga, MD, Last Rate: 40 mL/hr at 11/28/19 1046, Rate Change at 11/28/19 1046 .   dextrose 50 % solution 50 mL, 1 ampule, Intravenous, Once, Blakeney, Dana G, NP .  dextrose 50 % solution, , , ,  .  famotidine (PEPCID) IVPB 20 mg premix, 20 mg, Intravenous, Q12H, Kasa, Kurian, MD, Last Rate: 100 mL/hr at 11/28/19 0919, 20 mg at 11/28/19 0919 .  feeding supplement (PRO-STAT SUGAR FREE 64) liquid 60 mL, 60 mL, Per Tube, BID, Faydra Korman, MD, 60 mL at 11/28/19 0853 .  feeding supplement (VITAL HIGH PROTEIN) liquid 1,000 mL, 1,000 mL, Per Tube, Q24H, Tieisha Darden, MD, 1,000 mL at 11/27/19 1623 .  fentaNYL (SUBLIMAZE) bolus via infusion 50 mcg, 50 mcg, Intravenous, Q15 min PRN, [name] D, NP, 50 mcg at 11/25/19 2220 .  fentaNYL (SUBLIMAZE) injection 50 mcg, 50 mcg, Intravenous, Once, [name] D, NP .  fentaNYL 2566mg in NS 251m(1041mml) infusion-PREMIX, 0-400 mcg/hr, Intravenous, Continuous, Blakeney, DanDreama SaaP, Last Rate: 25 mL/hr at 11/28/19 0945, 250 mcg/hr at 11/28/19 0945 .  ipratropium-albuterol (DUONEB) 0.5-2.5 (3) MG/3ML nebulizer solution 3 mL, 3 mL, Nebulization, Q4H, Kasa, Kurian, MD, 3 mL at 11/28/19 0807 .  MEDLINE mouth rinse, 15 mL, Mouth Rinse, 10 times per day, KeeDarel Hong NP, 15 mL at 11/28/19 0854 .  midazolam (VERSED) injection 2 mg, 2 mg, Intravenous, Q2H PRN, KeeDarel Hong NP, 2 mg at 11/26/19 0144 .  multivitamin liquid 15 mL, 15 mL, Per Tube, Daily, AleOttie GlazierD, 15 mL at 11/28/19 0853 .  ondansetron (ZOFRAN) injection 4 mg, 4 mg, Intravenous, Q6H PRN, Callwood, Dwayne D, MD .  piperacillin-tazobactam (ZOSYN) IVPB 3.375 g, 3.375 g, Intravenous, Q8H, KeeDarel Hong NP, Last Rate: 12.5 mL/hr at 11/28/19 0517, 3.375 g at 11/28/19 0517 .  potassium chloride 20 MEQ/15ML (10%) solution 30 mEq, 30 mEq, Per Tube, Q4H, BlaAwilda BillP, 30 mEq at 11/28/19 0853 .  propofol (DIPRIVAN) 1000 MG/100ML infusion, 5-80 mcg/kg/min, Intravenous, Titrated, KeeDarel Hong NP, Last Rate: 39.8 mL/hr at 11/28/19 1032, 80  mcg/kg/min at 11/28/19 1032 .  rosuvastatin (CRESTOR) tablet 40 mg, 40 mg, Oral, q1800, Callwood, Dwayne D, MD, 40 mg at 11/27/19 1624 .  sodium chloride flush (NS) 0.9 % injection 3 mL, 3 mL, Intravenous, PRN, Callwood, Dwayne D, MD .  ticagrelor (BRILINTA) tablet 90 mg, 90 mg, Per Tube, BID, ChaBenita GutterPH, 90 mg at 11/28/19 [id number] vecuronium (NORCURON) injection 10 mg, 10 mg, Intravenous, Q2H PRN, KeeDarel Hong NP, 10 mg at 11/24/19 2306    ALLERGIES   Patient has no known allergies.    REVIEW OF SYSTEMS     Unable to obtain due to sedation   PHYSICAL EXAMINATION   Vital Signs: Temp:  [97.9 F (36.6 C)-98.4 F (36.9 C)] 97.9 F (36.6 C) (12/29 0901) Pulse Rate:  [52-96] 88 (12/29 0901) Resp:  [10-20] 18 (12/29 0901) SpO2:  [71 %-96 %] 95 % (12/29 0901) Arterial Line BP: (67-171)/(50-88) 92/65 (12/29 0901) FiO2 (%):  [30 %-60 %] 60 % (12/29 0945) Weight:  [95.2 kg] 95.2 kg (12/29 0355)  GENERAL:chronically ill appearing HEAD: Normocephalic, atraumatic.  EYES: Pupils equal, round,  reactive to light.  No scleral icterus.  MOUTH: Moist mucosal membrane. NECK: Supple. No thyromegaly. No nodules. No JVD.  PULMONARY: Bibasilar crackles  CARDIOVASCULAR: S1 and S2. Regular rate and rhythm. No murmurs, rubs, or gallops.  GASTROINTESTINAL: Soft, nontender, non-distended. No masses. Positive bowel sounds. No hepatosplenomegaly.  MUSCULOSKELETAL: No swelling, clubbing, or edema.  NEUROLOGIC: Mild distress due to acute illness SKIN:intact,warm,dry   PERTINENT DATA     Infusions: . sodium chloride    . sodium chloride    . dextrose 40 mL/hr at 11/28/19 1046  . famotidine (PEPCID) IV 20 mg (11/28/19 0919)  . fentaNYL infusion INTRAVENOUS 250 mcg/hr (11/28/19 0945)  . piperacillin-tazobactam (ZOSYN)  IV 3.375 g (11/28/19 0517)  . propofol (DIPRIVAN) infusion 80 mcg/kg/min (11/28/19 1032)   Scheduled Medications: . aspirin  81 mg Per Tube Daily  .  chlorhexidine gluconate (MEDLINE KIT)  15 mL Mouth Rinse BID  . Chlorhexidine Gluconate Cloth  6 each Topical Daily  . dextrose  1 ampule Intravenous Once  . dextrose      . feeding supplement (PRO-STAT SUGAR FREE 64)  60 mL Per Tube BID  . feeding supplement (VITAL HIGH PROTEIN)  1,000 mL Per Tube Q24H  . fentaNYL (SUBLIMAZE) injection  50 mcg Intravenous Once  . ipratropium-albuterol  3 mL Nebulization Q4H  . mouth rinse  15 mL Mouth Rinse 10 times per day  . multivitamin  15 mL Per Tube Daily  . potassium chloride  30 mEq Per Tube Q4H  . rosuvastatin  40 mg Oral q1800  . ticagrelor  90 mg Per Tube BID   PRN Medications: acetaminophen, acetaminophen, albuterol, atropine, fentaNYL, midazolam, ondansetron (ZOFRAN) IV, sodium chloride flush, vecuronium Hemodynamic parameters: CVP:  [7 mmHg-40 mmHg] 31 mmHg Intake/Output: 12/28 0701 - 12/29 0700 In: 1742.7 [I.V.:1488.8; NG/GT:12.3; IV Piggyback:241.5] Out: 5885 [Urine:1255]  Ventilator  Settings: Vent Mode: PRVC FiO2 (%):  [30 %-60 %] 60 % Set Rate:  [18 bmp] 18 bmp Vt Set:  [550 mL] 550 mL PEEP:  [5 cmH20-10 cmH20] 10 cmH20   Other Labs:     LAB RESULTS:  Basic Metabolic Panel: Recent Labs  Lab 11/23/19 1624 11/24/19 0838 11/25/19 0418 11/26/19 0422 11/27/19 0403 11/28/19 0345  NA  --  143 143 145 143 144  K  --  4.2 3.2* 3.0* 3.0* 3.2*  CL  --  110 103 104 106 109  CO2  --  24 25 32 29 30  GLUCOSE  --  140* 150* 119* 99 94  BUN  --  34* 28* 24* 25* 28*  CREATININE  --  1.44* 1.26* 0.99 0.89 0.75  CALCIUM  --  6.9* 6.6* 7.0* 7.4* 7.7*  MG 2.3  --  1.8  --   --  2.3  PHOS  --   --   --   --   --  3.1   Liver Function Tests: Recent Labs  Lab 11/24/19 0051 11/25/19 0418 11/26/19 0422 11/27/19 0403 11/28/19 0345  AST 143* 167* 105* 70* 45*  ALT 56* 65* 53* 46* 39  ALKPHOS 53 41 36* 37* 36*  BILITOT 0.7 0.7 0.9 0.9 0.7  PROT 5.3* 4.9* 4.9* 5.1* 4.9*  ALBUMIN 2.7* 2.6* 2.5* 2.4* 2.3*   No results for  input(s): LIPASE, AMYLASE in the last 168 hours. No results for input(s): AMMONIA in the last 168 hours. CBC: Recent Labs  Lab 11/23/19 1627 11/24/19 0051 11/25/19 0418 11/26/19 0422 11/27/19 0403 11/28/19 0345  WBC 20.6* 28.0*  20.1* 14.8* 12.0* 10.5  NEUTROABS 13.4*  --  17.3* 13.0* 9.8* 6.9  HGB 16.8 15.6 12.7* 10.6* 10.5* 10.4*  HCT 50.1 47.4 37.5* 32.5* 31.7* 32.6*  MCV 100.0 101.5* 98.2 100.6* 101.0* 103.5*  PLT 195 207 133* 117* 123* 121*   Cardiac Enzymes: No results for input(s): CKTOTAL, CKMB, CKMBINDEX, TROPONINI in the last 168 hours. BNP: Invalid input(s): POCBNP CBG: Recent Labs  Lab 11/28/19 0352 11/28/19 0813 11/28/19 0841 11/28/19 0857 11/28/19 0917  GLUCAP 88 38* 35* 41* 53*     IMAGING RESULTS:  Imaging: DG Chest Port 1 View  Result Date: 11/27/2019 CLINICAL DATA:  Acute respiratory failure. EXAM: PORTABLE CHEST 1 VIEW COMPARISON:  Chest x-ray 11/23/2019 FINDINGS: The endotracheal tube is 2.7 cm above the carina. The NG tube is coursing down the esophagus and into the stomach. The right IJ central venous catheter is stable with its tip in the mid SVC. The cardiac silhouette, mediastinal and hilar contours are within normal limits and stable. The external pacer paddles have been removed. Slight improved lung aeration. There is persistent interstitial prominence which could reflect mild edema but no infiltrates or effusions. IMPRESSION: 1. Support apparatus in good position, unchanged. 2. Interstitial prominence could suggest mild edema but overall improved aeration. Electronically Signed   By: Marijo Sanes M.D.   On: 11/27/2019 07:16   EEG adult  Result Date: 11/27/2019 Alexis Goodell, MD     11/27/2019  3:10 PM ELECTROENCEPHALOGRAM REPORT Patient: Nashid Pellum       Room #: IC07A-AA EEG No. ID: [id number] Age: 63 y.o.        Sex: male Referring Physician: Kasa Report Date:  11/27/2019       Interpreting Physician: Alexis Goodell History: Ralph Brouwer is an 63 y.o. male s/p arrest Medications: Propofol, Fentanyl, Zosyn, Brilinta, Crestor, Insulin, ASA Conditions of Recording:  This is a 21 channel routine scalp EEG performed with bipolar and monopolar montages arranged in accordance to the international 10/20 system of electrode placement. One channel was dedicated to EKG recording. The patient is in the intubated and sedated state. Description:  The waking background activity is discontinuous.  It consists of intermittent, generalized periods of attenuation lasting up to 15 seconds alternating with periods of higher voltage, disorganized activity.  This disorganized burst activity has an underlying slow rhythm consisting of polymorphic delta activity with superimposed faster rhythms. The patient received noxious stimulation with no activation of the background noted.  Hyperventilation and iIntermittent photic stimulation were not performed. IMPRESSION: This is an abnormal EEG due to a burst-suppression pattern seen throughout the tracing.  Burst suppression pattern can be seen in a variety of circumstances, including anesthesia (medicatoin effect), drug intoxication, hypothermia, as well as cerebral anoxia.  Clinical/neurological, and radiographic correlation advised.  Alexis Goodell, MD Neurology [phone] 11/27/2019, 2:58 PM      ASSESSMENT AND PLAN      63 yo white male with acute and severe hypoxic respiratory failure from acute V fib cardiac arrest and sudden cardiac deathSTEMI from acute RCA OCCLUSION andfrom COCAINE Toxicity. Now with Cardiogenic shock requiring multiple vasopressors. Concern for possible aspiration pneumonia.   Severe acute hypoxic and hypercapnic respiratory failure Continue full mechanical support Continue BD therapy Wean FiO2 and PEEP as tolerated -CXR reviewed as above with mild impromvement -   Acute MI and cardiac failure-STEMI and cocaine abuse Follow-up cardiology  recommendations Completed hypothermia protocol Antiplatelet agent therapy as per cardiology  Acute kidney injury and renal failure Follow Chem-7  Follow urine output Foley as needed Avoid nephrotoxic agents   Neurology Completed hypothermia protocol Plan for sedation vacation and assess neurological status We will need to consider CT or MRI for further assessment   Cardiac Acute MI Follow cardiology recommendations Continue ICU monitoring  Infectious disease Continue antibiotics for aspiration pneumonia   GI GI PROPHYLAXIS as indicated  NUTRITIONAL STATUS DIET-->TF's as tolerated Constipation protocol as indicated  ELECTROLYTES -follow labs as needed -replace as needed -pharmacy consultation and following   ENDO - will use ICU hypoglycemic\Hyperglycemia protocol if indicated    DVT/GI PRX ordered TRANSFUSIONS AS NEEDED MONITOR FSBS ASSESS the need for LABS as needed  S as needed   Critical care provider statement:    Critical care time (minutes):  32   Critical care time was exclusive of:  Separately billable procedures and treating other patients   Critical care was necessary to treat or prevent imminent or life-threatening deterioration of the following conditions:   Acute hypoxemic hypercapnic respiratory failure, cardiac arrest, cocaine abuse, acute kidney injury, multiple comorbid conditions   Critical care was time spent personally by me on the following activities:  Development of treatment plan with patient or surrogate, discussions with consultants, evaluation of patient's response to treatment, examination of patient, obtaining history from patient or surrogate, ordering and performing treatments and interventions, ordering and review of laboratory studies and re-evaluation of patient's condition.  I assumed direction of critical care for this patient from another provider in my specialty: no    This document was prepared using Dragon  voice recognition software and may include unintentional dictation errors.    Ottie Glazier, M.D.  Division of [place]

## 2019-11-28 NOTE — Progress Notes (Signed)
Attempted to assist tele visit to patient with son.  Invitation sent no response > 30 minutes.  [phone]  Vear Clock, RN

## 2019-11-29 ENCOUNTER — Inpatient Hospital Stay: Payer: 59

## 2019-11-29 LAB — CBC WITH DIFFERENTIAL/PLATELET
Abs Immature Granulocytes: 0.22 10*3/uL — ABNORMAL HIGH (ref 0.00–0.07)
Basophils Absolute: 0 10*3/uL (ref 0.0–0.1)
Basophils Relative: 0 %
Eosinophils Absolute: 0 10*3/uL (ref 0.0–0.5)
Eosinophils Relative: 0 %
HCT: 33.1 % — ABNORMAL LOW (ref 39.0–52.0)
Hemoglobin: 10.5 g/dL — ABNORMAL LOW (ref 13.0–17.0)
Immature Granulocytes: 2 %
Lymphocytes Relative: 7 %
Lymphs Abs: 0.7 10*3/uL (ref 0.7–4.0)
MCH: 33.1 pg (ref 26.0–34.0)
MCHC: 31.7 g/dL (ref 30.0–36.0)
MCV: 104.4 fL — ABNORMAL HIGH (ref 80.0–100.0)
Monocytes Absolute: 1 10*3/uL (ref 0.1–1.0)
Monocytes Relative: 10 %
Neutro Abs: 7.7 10*3/uL (ref 1.7–7.7)
Neutrophils Relative %: 81 %
Platelets: 113 10*3/uL — ABNORMAL LOW (ref 150–400)
RBC: 3.17 MIL/uL — ABNORMAL LOW (ref 4.22–5.81)
RDW: 13.2 % (ref 11.5–15.5)
WBC: 9.6 10*3/uL (ref 4.0–10.5)
nRBC: 0 % (ref 0.0–0.2)

## 2019-11-29 LAB — BLOOD GAS, ARTERIAL
Acid-base deficit: 9.3 mmol/L — ABNORMAL HIGH (ref 0.0–2.0)
Bicarbonate: 20.4 mmol/L (ref 20.0–28.0)
FIO2: 1
O2 Saturation: 95.9 %
Patient temperature: 37
pCO2 arterial: 60 mmHg — ABNORMAL HIGH (ref 32.0–48.0)
pH, Arterial: 7.14 — CL (ref 7.350–7.450)
pO2, Arterial: 104 mmHg (ref 83.0–108.0)

## 2019-11-29 LAB — COMPREHENSIVE METABOLIC PANEL
ALT: 36 U/L (ref 0–44)
AST: 34 U/L (ref 15–41)
Albumin: 2.3 g/dL — ABNORMAL LOW (ref 3.5–5.0)
Alkaline Phosphatase: 37 U/L — ABNORMAL LOW (ref 38–126)
Anion gap: 3 — ABNORMAL LOW (ref 5–15)
BUN: 27 mg/dL — ABNORMAL HIGH (ref 8–23)
CO2: 30 mmol/L (ref 22–32)
Calcium: 7.7 mg/dL — ABNORMAL LOW (ref 8.9–10.3)
Chloride: 109 mmol/L (ref 98–111)
Creatinine, Ser: 0.74 mg/dL (ref 0.61–1.24)
GFR calc Af Amer: >60 mL/min (ref >60)
GFR calc non Af Amer: >60 mL/min (ref >60)
Glucose, Bld: 130 mg/dL — ABNORMAL HIGH (ref 70–99)
Potassium: 4.7 mmol/L (ref 3.5–5.1)
Sodium: 142 mmol/L (ref 135–145)
Total Bilirubin: 0.7 mg/dL (ref 0.3–1.2)
Total Protein: 5.1 g/dL — ABNORMAL LOW (ref 6.5–8.1)

## 2019-11-29 LAB — GLUCOSE, CAPILLARY
Glucose-Capillary: 126 mg/dL — ABNORMAL HIGH (ref 70–99)
Glucose-Capillary: 84 mg/dL (ref 70–99)
Glucose-Capillary: 105 mg/dL — ABNORMAL HIGH (ref 70–99)
Glucose-Capillary: 167 mg/dL — ABNORMAL HIGH (ref 70–99)
Glucose-Capillary: 202 mg/dL — ABNORMAL HIGH (ref 70–99)
Glucose-Capillary: 161 mg/dL — ABNORMAL HIGH (ref 70–99)

## 2019-11-29 MED ORDER — METHYLPREDNISOLONE SODIUM SUCC 40 MG IJ SOLR
40.0000 mg | INTRAMUSCULAR | Status: DC
  Administered 2019-11-29 – 2019-12-05 (×7): 40 mg via INTRAVENOUS
  Filled 2019-11-29 (×7): qty 1

## 2019-11-29 MED ORDER — DEXMEDETOMIDINE HCL IN NACL 400 MCG/100ML IV SOLN
0.4000 ug/kg/h | INTRAVENOUS | Status: DC
  Administered 2019-11-29: 1.2 ug/kg/h via INTRAVENOUS
  Administered 2019-11-29: 0.8 ug/kg/h via INTRAVENOUS
  Administered 2019-11-29: 1.2 ug/kg/h via INTRAVENOUS
  Administered 2019-11-30: 19:00:00 0.8 ug/kg/h via INTRAVENOUS
  Administered 2019-11-30: 07:00:00 1.2 ug/kg/h via INTRAVENOUS
  Administered 2019-11-30: 02:00:00 0.8 ug/kg/h via INTRAVENOUS
  Administered 2019-11-30: 1.2 ug/kg/h via INTRAVENOUS
  Administered 2019-11-30: 0.6 ug/kg/h via INTRAVENOUS
  Administered 2019-12-01: 1 ug/kg/h via INTRAVENOUS
  Administered 2019-12-01: 0.8 ug/kg/h via INTRAVENOUS
  Administered 2019-12-01: 20:00:00 1.2 ug/kg/h via INTRAVENOUS
  Administered 2019-12-01: 06:00:00 0.8 ug/kg/h via INTRAVENOUS
  Administered 2019-12-02: 1 ug/kg/h via INTRAVENOUS
  Administered 2019-12-02 (×4): 1.2 ug/kg/h via INTRAVENOUS
  Administered 2019-12-03: 0.4 ug/kg/h via INTRAVENOUS
  Administered 2019-12-03: 05:00:00 0.6 ug/kg/h via INTRAVENOUS
  Filled 2019-11-29 (×17): qty 100
  Filled 2019-11-29: qty 200
  Filled 2019-11-29 (×3): qty 100

## 2019-11-29 MED ORDER — FUROSEMIDE 10 MG/ML IJ SOLN
40.0000 mg | Freq: Once | INTRAMUSCULAR | Status: AC
  Administered 2019-11-29: 11:00:00 40 mg via INTRAVENOUS
  Filled 2019-11-29: qty 4

## 2019-11-29 MED ORDER — AMLODIPINE BESYLATE 5 MG PO TABS
5.0000 mg | ORAL_TABLET | Freq: Once | ORAL | Status: AC
  Administered 2019-11-29: 18:00:00 5 mg
  Filled 2019-11-29: qty 1

## 2019-11-29 MED ORDER — PIVOT 1.5 CAL PO LIQD
1000.0000 mL | ORAL | Status: DC
  Administered 2019-11-29 – 2019-12-01 (×3): 1000 mL
  Filled 2019-11-29 (×7): qty 1000

## 2019-11-29 MED ORDER — FUROSEMIDE 10 MG/ML IJ SOLN
INTRAMUSCULAR | Status: AC
  Filled 2019-11-29: qty 4

## 2019-11-29 MED ORDER — FUROSEMIDE 10 MG/ML IJ SOLN
40.0000 mg | Freq: Once | INTRAMUSCULAR | Status: AC
  Administered 2019-11-29: 40 mg via INTRAVENOUS

## 2019-11-29 MED ORDER — ENOXAPARIN SODIUM 40 MG/0.4ML ~~LOC~~ SOLN
40.0000 mg | SUBCUTANEOUS | Status: DC
  Administered 2019-11-29 – 2019-12-10 (×12): 40 mg via SUBCUTANEOUS
  Filled 2019-11-29 (×12): qty 0.4

## 2019-11-29 MED ORDER — SODIUM CHLORIDE 0.9 % IV SOLN
2.0000 g | Freq: Two times a day (BID) | INTRAVENOUS | Status: DC
  Administered 2019-11-29 – 2019-12-03 (×9): 2 g via INTRAVENOUS
  Filled 2019-11-29 (×9): qty 2
  Filled 2019-11-29 (×2): qty 20

## 2019-11-29 NOTE — Progress Notes (Signed)
CRITICAL CARE PROGRESS NOTE    Name: Kurt [name] MRN: [id number] DOB: 1956-07-19     LOS: 6   SUBJECTIVE FINDINGS & SIGNIFICANT EVENTS   Patient description:   Pt with hx of polysubstance abuse,   Lines / Drains: PIVx2  Cultures / Sepsis markers: n/a  Antibiotics: zosyn   Protocols / Consultants: neurology  Tests / Events: EEG  Overnight: Aggitation with weaning of sedatives. Will initiate nourishment today via OGT   12/24-intubated/Vent support, cardiac arrest, s/p CATH-100% OCCLUSION OF RCA, COCAINE TOXICITY 12/24-HYPOTHERMIA PROTOCOL 12/24 - Critically ill, requiring 3 vasopressors, 100% FiO2 & 11PEEP 12/25 remains on pressors, on vent, fio2 100% 12/26 remains on hypothermia protocol 12/27 completed hypothermia protocol 12/28- remains with high fiO2 requirement and difficulty to wean 12/29 - recruitment maneuvers done, remains hypoxemic poorly responsive with weaning of sedation.  Discussed with son at bedside today.  12/30- patient remains with episodes of hypoglycemia. Plan is to diurese today as well as control CBG and BP better with SBP after  PAST MEDICAL HISTORY   Past Medical History:  Diagnosis Date  . COPD (chronic obstructive pulmonary disease) ([place])   . ED (erectile dysfunction)   . Hypertension   . Lumbar disc disease      SURGICAL HISTORY   Past Surgical History:  Procedure Laterality Date  . APPENDECTOMY    . CORONARY STENT INTERVENTION N/A 11/23/2019   Procedure: CORONARY STENT INTERVENTION;  Surgeon: Yolonda Kida, MD;  Location: [facility];  Service: Cardiovascular;  Laterality: N/A;  RCA  . CORONARY/GRAFT ACUTE MI REVASCULARIZATION N/A 11/23/2019   Procedure: Coronary/Graft Acute MI Revascularization;  Surgeon: Yolonda Kida, MD;   Location: [facility];  Service: Cardiovascular;  Laterality: N/A;  . LEFT HEART CATH AND CORONARY ANGIOGRAPHY N/A 11/23/2019   Procedure: LEFT HEART CATH AND CORONARY ANGIOGRAPHY;  Surgeon: Yolonda Kida, MD;  Location: [facility];  Service: Cardiovascular;  Laterality: N/A;     FAMILY HISTORY   Family History  Problem Relation Age of Onset  . Hyperlipidemia Father   . Cancer Father   . Heart disease Father        heart valve disease (from Agent Orange?)  . Alcohol abuse Mother   . Hypertension Neg Hx   . Diabetes Neg Hx   . Colon cancer Neg Hx   . Colon polyps Neg Hx   . Pancreatic cancer Neg Hx   . Rectal cancer Neg Hx   . Stomach cancer Neg Hx      SOCIAL HISTORY   Social History   Tobacco Use  . Smoking status: Current Every Day Smoker  . Smokeless tobacco: Never Used  Substance Use Topics  . Alcohol use: Yes  . Drug use: Yes    Types: Marijuana     MEDICATIONS   Current Medication:  Current Facility-Administered Medications:  .  0.9 %  sodium chloride infusion, , Intravenous, Continuous, Funke, Mary E, MD .  0.9 %  sodium chloride infusion, , Intravenous, Continuous, Kasa, Kurian, MD .  acetaminophen (TYLENOL) tablet 650 mg, 650 mg, Oral, Q4H PRN, Flora Lipps, MD .  acetaminophen (TYLENOL) tablet 650 mg, 650 mg, Oral, Q4H PRN, Callwood, Dwayne D, MD .  albuterol (PROVENTIL) (2.5 MG/3ML) 0.083% nebulizer solution 2.5 mg, 2.5 mg, Nebulization, Q2H PRN, Kasa, Kurian, MD .  aspirin chewable tablet 81 mg, 81 mg, Per Tube, Daily, Benita Gutter, RPH, 81 mg at 11/29/19 0845 .  atropine 1 MG/10ML  injection 1 mg, 1 mg, Intravenous, PRN, Awilda Bill, NP .  chlorhexidine gluconate (MEDLINE KIT) (PERIDEX) 0.12 % solution 15 mL, 15 mL, Mouth Rinse, BID, [name] D, NP, 15 mL at 11/29/19 0846 .  Chlorhexidine Gluconate Cloth 2 % PADS 6 each, 6 each, Topical, Daily, [name], NP, 6 each at 11/29/19 [phone] .  dextrose 50 %  solution 50 mL, 1 ampule, Intravenous, Once, Blakeney, Dreama Saa, NP .  famotidine (PEPCID) IVPB 20 mg premix, 20 mg, Intravenous, Q12H, Flora Lipps, MD, Stopped at 11/28/19 2305 .  feeding supplement (PRO-STAT SUGAR FREE 64) liquid 60 mL, 60 mL, Per Tube, BID, Berlynn Warsame, MD, 60 mL at 11/29/19 0846 .  feeding supplement (VITAL HIGH PROTEIN) liquid 1,000 mL, 1,000 mL, Per Tube, Q24H, Melayah Skorupski, MD, 1,000 mL at 11/28/19 1741 .  fentaNYL (SUBLIMAZE) bolus via infusion 50 mcg, 50 mcg, Intravenous, Q15 min PRN, [name] D, NP, 50 mcg at 11/25/19 2220 .  fentaNYL (SUBLIMAZE) injection 50 mcg, 50 mcg, Intravenous, Once, [name] D, NP .  fentaNYL 2550mg in NS 2543m(1057mml) infusion-PREMIX, 0-400 mcg/hr, Intravenous, Continuous, Blakeney, DanDreama SaaP, Last Rate: 10 mL/hr at 11/29/19 0528, 100 mcg/hr at 11/29/19 0528 .  ipratropium-albuterol (DUONEB) 0.5-2.5 (3) MG/3ML nebulizer solution 3 mL, 3 mL, Nebulization, Q4H, Kasa, Kurian, MD, 3 mL at 11/29/19 0726 .  MEDLINE mouth rinse, 15 mL, Mouth Rinse, 10 times per day, KeeDarel Hong NP, 15 mL at 11/29/19 0528 .  midazolam (VERSED) injection 2 mg, 2 mg, Intravenous, Q2H PRN, KeeDarel Hong NP, 2 mg at 11/26/19 0144 .  multivitamin liquid 15 mL, 15 mL, Per Tube, Daily, AleOttie GlazierD, 15 mL at 11/29/19 0846 .  ondansetron (ZOFRAN) injection 4 mg, 4 mg, Intravenous, Q6H PRN, Callwood, Dwayne D, MD .  piperacillin-tazobactam (ZOSYN) IVPB 3.375 g, 3.375 g, Intravenous, Q8H, KeeDarel Hong NP, Last Rate: 12.5 mL/hr at 11/29/19 0528, Rate Verify at 11/29/19 0528 .  propofol (DIPRIVAN) 1000 MG/100ML infusion, 5-80 mcg/kg/min, Intravenous, Titrated, KeeDarel Hong NP, Last Rate: 24.9 mL/hr at 11/29/19 0528, 50 mcg/kg/min at 11/29/19 0528 .  rosuvastatin (CRESTOR) tablet 40 mg, 40 mg, Per Tube, q1800, Abdulrahim Siddiqi, MD .  sodium chloride 0.9 % 640 mL with dextrose 250 g infusion, , Intravenous, Continuous, Jerita Wimbush,  Hanah Moultry, MD, Last Rate: 30 mL/hr at 11/29/19 0528, Rate Verify at 11/29/19 0528 .  sodium chloride flush (NS) 0.9 % injection 3 mL, 3 mL, Intravenous, PRN, Callwood, Dwayne D, MD .  ticagrelor (BRILINTA) tablet 90 mg, 90 mg, Per Tube, BID, ChaBenita GutterPH, 90 mg at 11/29/19 0845 .  vecuronium (NORCURON) injection 10 mg, 10 mg, Intravenous, Q2H PRN, KeeDarel Hong NP, 10 mg at 11/24/19 2306    ALLERGIES   Patient has no known allergies.    REVIEW OF SYSTEMS     Unable to obtain due to sedation   PHYSICAL EXAMINATION   Vital Signs: Temp:  [97.3 F (36.3 C)-99 F (37.2 C)] 98.6 F (37 C) (12/30 0900) Pulse Rate:  [57-83] 69 (12/30 0900) Resp:  [18-19] 18 (12/30 0900) SpO2:  [93 %-99 %] 96 % (12/30 0900) Arterial Line BP: (77-124)/(44-98) 114/54 (12/30 0900) FiO2 (%):  [45 %-60 %] 45 % (12/30 0808) Weight:  [95.9 kg] 95.9 kg (12/30 0405)  GENERAL:chronically ill appearing HEAD: Normocephalic, atraumatic.  EYES: Pupils equal, round, reactive to light.  No scleral icterus.  MOUTH: Moist mucosal membrane. NECK: Supple. No thyromegaly. No  nodules. No JVD.  PULMONARY: Bibasilar crackles  CARDIOVASCULAR: S1 and S2. Regular rate and rhythm. No murmurs, rubs, or gallops.  GASTROINTESTINAL: Soft, nontender, non-distended. No masses. Positive bowel sounds. No hepatosplenomegaly.  MUSCULOSKELETAL: No swelling, clubbing, or edema.  NEUROLOGIC: Mild distress due to acute illness SKIN:intact,warm,dry   PERTINENT DATA     Infusions: . sodium chloride    . sodium chloride    . famotidine (PEPCID) IV Stopped (11/28/19 2305)  . fentaNYL infusion INTRAVENOUS 100 mcg/hr (11/29/19 0528)  . piperacillin-tazobactam (ZOSYN)  IV 12.5 mL/hr at 11/29/19 0528  . propofol (DIPRIVAN) infusion 50 mcg/kg/min (11/29/19 0528)  . sodium chloride 0.9 % 640 mL with dextrose 250 g infusion 30 mL/hr at 11/29/19 3009   Scheduled Medications: . aspirin  81 mg Per Tube Daily  . chlorhexidine  gluconate (MEDLINE KIT)  15 mL Mouth Rinse BID  . Chlorhexidine Gluconate Cloth  6 each Topical Daily  . dextrose  1 ampule Intravenous Once  . feeding supplement (PRO-STAT SUGAR FREE 64)  60 mL Per Tube BID  . feeding supplement (VITAL HIGH PROTEIN)  1,000 mL Per Tube Q24H  . fentaNYL (SUBLIMAZE) injection  50 mcg Intravenous Once  . ipratropium-albuterol  3 mL Nebulization Q4H  . mouth rinse  15 mL Mouth Rinse 10 times per day  . multivitamin  15 mL Per Tube Daily  . rosuvastatin  40 mg Per Tube q1800  . ticagrelor  90 mg Per Tube BID   PRN Medications: acetaminophen, acetaminophen, albuterol, atropine, fentaNYL, midazolam, ondansetron (ZOFRAN) IV, sodium chloride flush, vecuronium Hemodynamic parameters: CVP:  [10 mmHg-31 mmHg] 11 mmHg Intake/Output: 12/29 0701 - 12/30 0700 In: 2745.6 [I.V.:1794.8; NG/GT:700; IV Piggyback:250.8] Out: 1200 [QZRAQ:7622]  Ventilator  Settings: Vent Mode: PRVC FiO2 (%):  [45 %-60 %] 45 % Set Rate:  [18 bmp] 18 bmp Vt Set:  [550 mL] 550 mL PEEP:  [10 cmH20] 10 cmH20   Other Labs:     LAB RESULTS:  Basic Metabolic Panel: Recent Labs  Lab 11/23/19 1624 11/25/19 0418 11/26/19 0422 11/27/19 0403 11/28/19 0345 11/29/19 0359  NA  --  143 145 143 144 142  K  --  3.2* 3.0* 3.0* 3.2* 4.7  CL  --  103 104 106 109 109  CO2  --  25 32 '29 30 30  ' GLUCOSE  --  150* 119* 99 94 130*  BUN  --  28* 24* 25* 28* 27*  CREATININE  --  1.26* 0.99 0.89 0.75 0.74  CALCIUM  --  6.6* 7.0* 7.4* 7.7* 7.7*  MG 2.3 1.8  --   --  2.3  --   PHOS  --   --   --   --  3.1  --    Liver Function Tests: Recent Labs  Lab 11/25/19 0418 11/26/19 0422 11/27/19 0403 11/28/19 0345 11/29/19 0359  AST 167* 105* 70* 45* 34  ALT 65* 53* 46* 39 36  ALKPHOS 41 36* 37* 36* 37*  BILITOT 0.7 0.9 0.9 0.7 0.7  PROT 4.9* 4.9* 5.1* 4.9* 5.1*  ALBUMIN 2.6* 2.5* 2.4* 2.3* 2.3*   No results for input(s): LIPASE, AMYLASE in the last 168 hours. No results for input(s): AMMONIA in  the last 168 hours. CBC: Recent Labs  Lab 11/25/19 0418 11/26/19 0422 11/27/19 0403 11/28/19 0345 11/29/19 0359  WBC 20.1* 14.8* 12.0* 10.5 9.6  NEUTROABS 17.3* 13.0* 9.8* 6.9 7.7  HGB 12.7* 10.6* 10.5* 10.4* 10.5*  HCT 37.5* 32.5* 31.7* 32.6* 33.1*  MCV  98.2 100.6* 101.0* 103.5* 104.4*  PLT 133* 117* 123* 121* 113*   Cardiac Enzymes: No results for input(s): CKTOTAL, CKMB, CKMBINDEX, TROPONINI in the last 168 hours. BNP: Invalid input(s): POCBNP CBG: Recent Labs  Lab 11/28/19 1557 11/28/19 1932 11/28/19 2314 11/29/19 0402 11/29/19 0823  GLUCAP 105* 81 139* 126* 84     IMAGING RESULTS:  Imaging: DG Chest Port 1 View  Result Date: 11/29/2019 CLINICAL DATA:  Acute respiratory failure. EXAM: PORTABLE CHEST 1 VIEW COMPARISON:  November 27, 2019. FINDINGS: Stable cardiomediastinal silhouette. Endotracheal and nasogastric tubes are unchanged in position. No pneumothorax is noted. Increased bibasilar opacities are noted concerning for worsening atelectasis or edema with small right pleural effusion. Bony thorax is unremarkable. IMPRESSION: Stable support apparatus. Increased bibasilar opacities are noted concerning for worsening atelectasis or edema with small right pleural effusion. No pneumothorax is noted. Electronically Signed   By: Marijo Conception M.D.   On: 11/29/2019 07:58   EEG adult  Result Date: 11/27/2019 Alexis Goodell, MD     11/27/2019  3:10 PM ELECTROENCEPHALOGRAM REPORT Patient: Kurt [name]       Room #: IC07A-AA EEG No. ID: [id number] Age: 63 y.o.        Sex: male Referring Physician: Kasa Report Date:  11/27/2019       Interpreting Physician: Alexis Goodell History: Kurt [name] is an 63 y.o. male s/p arrest Medications: Propofol, Fentanyl, Zosyn, Brilinta, Crestor, Insulin, ASA Conditions of Recording:  This is a 21 channel routine scalp EEG performed with bipolar and monopolar montages arranged in accordance to the international 10/20 system of electrode  placement. One channel was dedicated to EKG recording. The patient is in the intubated and sedated state. Description:  The waking background activity is discontinuous.  It consists of intermittent, generalized periods of attenuation lasting up to 15 seconds alternating with periods of higher voltage, disorganized activity.  This disorganized burst activity has an underlying slow rhythm consisting of polymorphic delta activity with superimposed faster rhythms. The patient received noxious stimulation with no activation of the background noted.  Hyperventilation and iIntermittent photic stimulation were not performed. IMPRESSION: This is an abnormal EEG due to a burst-suppression pattern seen throughout the tracing.  Burst suppression pattern can be seen in a variety of circumstances, including anesthesia (medicatoin effect), drug intoxication, hypothermia, as well as cerebral anoxia.  Clinical/neurological, and radiographic correlation advised.  Alexis Goodell, MD Neurology [phone] 11/27/2019, 2:58 PM      ASSESSMENT AND PLAN      63 yo white male with acute and severe hypoxic respiratory failure from acute V fib cardiac arrest and sudden cardiac deathSTEMI from acute RCA OCCLUSION andfrom COCAINE Toxicity. Now with Cardiogenic shock requiring multiple vasopressors. Concern for possible aspiration pneumonia.   Severe acute hypoxic and hypercapnic respiratory failure Continue full mechanical support Continue BD therapy Wean FiO2 and PEEP as tolerated CXR with worsening airspace opacities - possibly due to net + fluid balance currently at +10L.  Will diurese today. Have changed fluids to D25at 30/hr due to hypoglycemia.     Acute MI and cardiac failure-STEMI and cocaine abuse Follow-up cardiology recommendations Completed hypothermia protocol Antiplatelet agent therapy as per cardiology   Acute kidney injury and renal failure Follow Chem-7 Follow urine output Foley as  needed Avoid nephrotoxic agents   Neurology Completed hypothermia protocol Plan for sedation vacation and assess neurological status We will need to consider CT or MRI for further assessment   Cardiac Acute MI Follow cardiology recommendations Continue ICU  monitoring  Infectious disease Continue antibiotics for aspiration pneumonia   GI GI PROPHYLAXIS as indicated  NUTRITIONAL STATUS DIET-->TF's as tolerated Constipation protocol as indicated  ELECTROLYTES -follow labs as needed -replace as needed -pharmacy consultation and following   ENDO - will use ICU hypoglycemic\Hyperglycemia protocol if indicated    DVT/GI PRX ordered TRANSFUSIONS AS NEEDED MONITOR FSBS ASSESS the need for LABS as needed  S as needed   Critical care provider statement:    Critical care time (minutes):  32   Critical care time was exclusive of:  Separately billable procedures and treating other patients   Critical care was necessary to treat or prevent imminent or life-threatening deterioration of the following conditions:   Acute hypoxemic hypercapnic respiratory failure, cardiac arrest, cocaine abuse, acute kidney injury, multiple comorbid conditions   Critical care was time spent personally by me on the following activities:  Development of treatment plan with patient or surrogate, discussions with consultants, evaluation of patient's response to treatment, examination of patient, obtaining history from patient or surrogate, ordering and performing treatments and interventions, ordering and review of laboratory studies and re-evaluation of patient's condition.  I assumed direction of critical care for this patient from another provider in my specialty: no    This document was prepared using Dragon voice recognition software and may include unintentional dictation errors.    Ottie Glazier, M.D.  Division of [place]

## 2019-11-29 NOTE — Progress Notes (Signed)
[facility] Cardiology    SUBJECTIVE: Intubated sedated   Vitals:   11/29/19 0400 11/29/19 0405 11/29/19 0500 11/29/19 0600  BP:      Pulse: (!) 57  63 68  Resp: 18  18 18   Temp: (!) 97.3 F (36.3 C)  (!) 97.3 F (36.3 C) 97.7 F (36.5 C)  TempSrc: Bladder     SpO2: 98%  99% 98%  Weight:  95.9 kg    Height:         Intake/Output Summary (Last 24 hours) at 11/29/2019 [id number] Last data filed at 11/29/2019 [id number] Gross per 24 hour  Intake 2745.55 ml  Output 1200 ml  Net 1545.55 ml      PHYSICAL EXAM  General: Well developed, well nourished, in no acute distress HEENT:  Normocephalic and atramatic Neck:  No JVD.  Lungs: Clear bilaterally to auscultation and percussion. Heart: HRRR . Normal S1 and S2 without gallops or murmurs.  Abdomen: Bowel sounds are positive, abdomen soft and non-tender  Msk:  Back normal, normal gait. Normal strength and tone for age. Extremities: No clubbing, cyanosis or edema.   Neuro: Alert and oriented X 3. Psych:  Good affect, responds appropriately   LABS: Basic Metabolic Panel: Recent Labs    11/28/19 0345 11/29/19 0359  NA 144 142  K 3.2* 4.7  CL 109 109  CO2 30 30  GLUCOSE 94 130*  BUN 28* 27*  CREATININE 0.75 0.74  CALCIUM 7.7* 7.7*  MG 2.3  --   PHOS 3.1  --    Liver Function Tests: Recent Labs    11/28/19 0345 11/29/19 0359  AST 45* 34  ALT 39 36  ALKPHOS 36* 37*  BILITOT 0.7 0.7  PROT 4.9* 5.1*  ALBUMIN 2.3* 2.3*   No results for input(s): LIPASE, AMYLASE in the last 72 hours. CBC: Recent Labs    11/28/19 0345 11/29/19 0359  WBC 10.5 9.6  NEUTROABS 6.9 7.7  HGB 10.4* 10.5*  HCT 32.6* 33.1*  MCV 103.5* 104.4*  PLT 121* 113*   Cardiac Enzymes: No results for input(s): CKTOTAL, CKMB, CKMBINDEX, TROPONINI in the last 72 hours. BNP: Invalid input(s): POCBNP D-Dimer: No results for input(s): DDIMER in the last 72 hours. Hemoglobin A1C: No results for input(s): HGBA1C in the last 72 hours. Fasting Lipid Panel:  Recent Labs    11/26/19 2232  TRIG 227*   Thyroid Function Tests: No results for input(s): TSH, T4TOTAL, T3FREE, THYROIDAB in the last 72 hours.  Invalid input(s): FREET3 Anemia Panel: No results for input(s): VITAMINB12, FOLATE, FERRITIN, TIBC, IRON, RETICCTPCT in the last 72 hours.  EEG adult  Result Date: 11/27/2019 Alexis Goodell, MD     11/27/2019  3:10 PM ELECTROENCEPHALOGRAM REPORT Patient: Selestino Grainer       Room #: IC07A-AA EEG No. ID: [id number] Age: 63 y.o.        Sex: male Referring Physician: Kasa Report Date:  11/27/2019       Interpreting Physician: Alexis Goodell History: Lander Hemauer is an 63 y.o. male s/p arrest Medications: Propofol, Fentanyl, Zosyn, Brilinta, Crestor, Insulin, ASA Conditions of Recording:  This is a 21 channel routine scalp EEG performed with bipolar and monopolar montages arranged in accordance to the international 10/20 system of electrode placement. One channel was dedicated to EKG recording. The patient is in the intubated and sedated state. Description:  The waking background activity is discontinuous.  It consists of intermittent, generalized periods of attenuation lasting up to 15 seconds alternating with periods of higher  voltage, disorganized activity.  This disorganized burst activity has an underlying slow rhythm consisting of polymorphic delta activity with superimposed faster rhythms. The patient received noxious stimulation with no activation of the background noted.  Hyperventilation and iIntermittent photic stimulation were not performed. IMPRESSION: This is an abnormal EEG due to a burst-suppression pattern seen throughout the tracing.  Burst suppression pattern can be seen in a variety of circumstances, including anesthesia (medicatoin effect), drug intoxication, hypothermia, as well as cerebral anoxia.  Clinical/neurological, and radiographic correlation advised.  Alexis Goodell, MD Neurology [phone] 11/27/2019, 2:58 PM     Echo  relatively preserved left ventricular function, diminished RV function  TELEMETRY: Sinus rhythm nonspecific ST-T wave changes no significant arrhythmias:  ASSESSMENT AND PLAN:  Active Problems:   Cardiac arrest [facility])   STEMI involving right coronary artery ([place])    1.  Status post cardiac arrest STEMI status post PCI and stent RCA with DES Cocaine abuse Respiratory failure Multisystem organ failure Hypotension Sepsis . Plan Agree with ICU level care Continue respiratory and vent support Maintain aspirin and Brilinta post PCI and stent Continue blood pressure support with pressors and fluids Agree with renal input with acute renal insufficiency Continue telemetry cardiac input at this point     Yolonda Kida, MD 11/29/2019 7:11 AM

## 2019-11-29 NOTE — Progress Notes (Signed)
Patient hypertensive. B/P via arterial line and cuff. MD at bedside to assess. New order for additional lasix 40 mg.

## 2019-11-29 NOTE — Progress Notes (Signed)
[facility]    SUBJECTIVE: Intubated sedated   Vitals:   11/29/19 0400 11/29/19 0405 11/29/19 0500 11/29/19 0600  BP:      Pulse: (!) 57  63 68  Resp: 18  18 18   Temp: (!) 97.3 F (36.3 C)  (!) 97.3 F (36.3 C) 97.7 F (36.5 C)  TempSrc: Bladder     SpO2: 98%  99% 98%  Weight:  95.9 kg    Height:         Intake/Output Summary (Last 24 hours) at 11/29/2019 [id number] Last data filed at 11/29/2019 [id number] Gross per 24 hour  Intake 2745.55 ml  Output 1200 ml  Net 1545.55 ml      PHYSICAL EXAM  General: Well developed, well nourished, in no acute distress HEENT:  Normocephalic and atramatic Neck:  No JVD.  Lungs: Clear bilaterally to auscultation and percussion. Heart: HRRR . Normal S1 and S2 without gallops or murmurs.  Abdomen: Bowel sounds are positive, abdomen soft and non-tender  Msk:  Back normal, normal gait. Normal strength and tone for age. Extremities: No clubbing, cyanosis or edema.   Neuro: Alert and oriented X 3. Psych:  Good affect, responds appropriately   LABS: Basic Metabolic Panel: Recent Labs    11/28/19 0345 11/29/19 0359  NA 144 142  K 3.2* 4.7  CL 109 109  CO2 30 30  GLUCOSE 94 130*  BUN 28* 27*  CREATININE 0.75 0.74  CALCIUM 7.7* 7.7*  MG 2.3  --   PHOS 3.1  --    Liver Function Tests: Recent Labs    11/28/19 0345 11/29/19 0359  AST 45* 34  ALT 39 36  ALKPHOS 36* 37*  BILITOT 0.7 0.7  PROT 4.9* 5.1*  ALBUMIN 2.3* 2.3*   No results for input(s): LIPASE, AMYLASE in the last 72 hours. CBC: Recent Labs    11/28/19 0345 11/29/19 0359  WBC 10.5 9.6  NEUTROABS 6.9 7.7  HGB 10.4* 10.5*  HCT 32.6* 33.1*  MCV 103.5* 104.4*  PLT 121* 113*   Cardiac Enzymes: No results for input(s): CKTOTAL, CKMB, CKMBINDEX, TROPONINI in the last 72 hours. BNP: Invalid input(s): POCBNP D-Dimer: No results for input(s): DDIMER in the last 72 hours. Hemoglobin A1C: No results for input(s): HGBA1C in the last 72 hours. Fasting Lipid Panel:  Recent Labs    11/26/19 2232  TRIG 227*   Thyroid Function Tests: No results for input(s): TSH, T4TOTAL, T3FREE, THYROIDAB in the last 72 hours.  Invalid input(s): FREET3 Anemia Panel: No results for input(s): VITAMINB12, FOLATE, FERRITIN, TIBC, IRON, RETICCTPCT in the last 72 hours.  EEG adult  Result Date: 11/27/2019 Alexis Goodell, MD     11/27/2019  3:10 PM ELECTROENCEPHALOGRAM REPORT Patient: Kurt [name]       Room #: IC07A-AA EEG No. ID: [id number] Age: 63 y.o.        Sex: male Referring Physician: Kasa Report Date:  11/27/2019       Interpreting Physician: Alexis Goodell History: Kurt [name] is an 63 y.o. male s/p arrest Medications: Propofol, Fentanyl, Zosyn, Brilinta, Crestor, Insulin, ASA Conditions of Recording:  This is a 21 channel routine scalp EEG performed with bipolar and monopolar montages arranged in accordance to the international 10/20 system of electrode placement. One channel was dedicated to EKG recording. The patient is in the intubated and sedated state. Description:  The waking background activity is discontinuous.  It consists of intermittent, generalized periods of attenuation lasting up to 15 seconds alternating with periods of higher  voltage, disorganized activity.  This disorganized burst activity has an underlying slow rhythm consisting of polymorphic delta activity with superimposed faster rhythms. The patient received noxious stimulation with no activation of the background noted.  Hyperventilation and iIntermittent photic stimulation were not performed. IMPRESSION: This is an abnormal EEG due to a burst-suppression pattern seen throughout the tracing.  Burst suppression pattern can be seen in a variety of circumstances, including anesthesia (medicatoin effect), drug intoxication, hypothermia, as well as cerebral anoxia.  Clinical/neurological, and radiographic correlation advised.  Alexis Goodell, MD Neurology [phone] 11/27/2019, 2:58 PM     Echo  preserved left ventricular function reduced RV function  TELEMETRY: Normal sinus rhythm nonspecific ECG changes  ASSESSMENT AND PLAN:  Active Problems:   Cardiac arrest [facility])   STEMI involving right coronary artery ([place])    1.  Acute respiratory failure Cardiac arrest STEMI inferior Substance abuse Hypotension Sepsis Altered mental status . Plan Continue critical care management Continue ventilatory support Maintain aspirin and Brilinta post PCI and stent Continue pressor support for blood pressure as needed Agree with neurology involvement with altered mental status Broad-spectrum antibiotic therapy as necessary for possible sepsis Continue limited cardiac input at this point    Yolonda Kida, MD 11/29/2019 7:17 AM

## 2019-11-29 NOTE — Progress Notes (Signed)
Nutrition Follow-up  DOCUMENTATION CODES:   Not applicable  INTERVENTION:  Initiate Pivot 1.5 Cal at 50 mL/hr (1200 mL goal daily volume). Provides 1800 kcal, 113 grams of protein, 900 mL H2O daily.  Continue minimum free water flush of 30 mL Q4hrs to maintain tube patency.  Will discontinue liquid MVI as new TF regimen meets 100% RDIs for vitamins/minerals.  NUTRITION DIAGNOSIS:   Inadequate oral intake related to inability to eat as evidenced by NPO status.  Ongoing - addressing with TF regimen.  GOAL:   Provide needs based on ASPEN/SCCM guidelines  Met with TF regimen.  MONITOR:   Vent status, Labs, Weight trends, TF tolerance, I & O's  REASON FOR ASSESSMENT:   Ventilator    ASSESSMENT:   63 year old male with PMHx of COPD, ED, HTN admitted with acute cardiac arrest and acute STEMI with RCA occlusion associated with cocaine toxicity s/p PCI to proximal RCA, s/p 36C TTM protocol.  Patient is currently intubated on ventilator support MV: 10 L/min Temp (24hrs), Avg:98.3 F (36.8 C), Min:97.3 F (36.3 C), Max:99 F (37.2 C)  Propofol: now discontinued  Medications reviewed and include: Solu-Medrol 40 mg Q24hrs IV, liquid MVI daily, ceftriaxone, Precedex gtt, famotidine, fentanyl gtt.  Labs reviewed: CBG 84-126, BUN 27.  Enteral Access: OGT  TF regimen: Vital High Protein at 20 mL/hr + Pro-Stat 60 mL BID  Discussed on rounds. Plan is to increase calories provided in tube feeds today to see if it will help with reducing dextrose infusion.   Diet Order:   Diet Order            Diet NPO time specified  Diet effective now             EDUCATION NEEDS:   No education needs have been identified at this time  Skin:  Skin Assessment: Reviewed RN Assessment  Last BM:  11/29/2019 - type 7 per rectal tube  Height:   Ht Readings from Last 1 Encounters:  11/23/19 _0  (1.753 m)   Weight:   Wt Readings from Last 1 Encounters:  11/29/19 95.9 kg    Ideal Body Weight:  72.7 kg  BMI:  Body mass index is 31.22 kg/m.  Estimated Nutritional Needs:   Kcal:  1874 (PSU 2003b w/ MSJ 1629, Ve 10, Tmax 37.2)  Protein:  100-124 grams (1.2-1.5 grams/kg)  Fluid:  2-2.3 L/day  Jacklynn Barnacle, MS, RD, LDN Office: [phone] Pager: [phone] After Hours/Weekend Pager: [phone]

## 2019-11-29 NOTE — Progress Notes (Signed)
Cath lab RN to bedside to remove femoral sheath. Dressing CDI. Patient to remain flat x 1 hour.

## 2019-11-30 ENCOUNTER — Inpatient Hospital Stay: Payer: 59

## 2019-11-30 LAB — CBC WITH DIFFERENTIAL/PLATELET
Abs Immature Granulocytes: 0.23 10*3/uL — ABNORMAL HIGH (ref 0.00–0.07)
Basophils Absolute: 0 10*3/uL (ref 0.0–0.1)
Basophils Relative: 0 %
Eosinophils Absolute: 0.1 10*3/uL (ref 0.0–0.5)
Eosinophils Relative: 1 %
HCT: 35.5 % — ABNORMAL LOW (ref 39.0–52.0)
Hemoglobin: 12 g/dL — ABNORMAL LOW (ref 13.0–17.0)
Immature Granulocytes: 2 %
Lymphocytes Relative: 13 %
Lymphs Abs: 1.6 10*3/uL (ref 0.7–4.0)
MCH: 33.1 pg (ref 26.0–34.0)
MCHC: 33.8 g/dL (ref 30.0–36.0)
MCV: 98.1 fL (ref 80.0–100.0)
Monocytes Absolute: 1.7 10*3/uL — ABNORMAL HIGH (ref 0.1–1.0)
Monocytes Relative: 15 %
Neutro Abs: 8.3 10*3/uL — ABNORMAL HIGH (ref 1.7–7.7)
Neutrophils Relative %: 69 %
Platelets: 141 10*3/uL — ABNORMAL LOW (ref 150–400)
RBC: 3.62 MIL/uL — ABNORMAL LOW (ref 4.22–5.81)
RDW: 13 % (ref 11.5–15.5)
WBC: 12 10*3/uL — ABNORMAL HIGH (ref 4.0–10.5)
nRBC: 0 % (ref 0.0–0.2)

## 2019-11-30 LAB — GLUCOSE, CAPILLARY
Glucose-Capillary: 149 mg/dL — ABNORMAL HIGH (ref 70–99)
Glucose-Capillary: 139 mg/dL — ABNORMAL HIGH (ref 70–99)
Glucose-Capillary: 167 mg/dL — ABNORMAL HIGH (ref 70–99)
Glucose-Capillary: 150 mg/dL — ABNORMAL HIGH (ref 70–99)
Glucose-Capillary: 110 mg/dL — ABNORMAL HIGH (ref 70–99)

## 2019-11-30 LAB — COMPREHENSIVE METABOLIC PANEL
ALT: 39 U/L (ref 0–44)
AST: 39 U/L (ref 15–41)
Albumin: 2.5 g/dL — ABNORMAL LOW (ref 3.5–5.0)
Alkaline Phosphatase: 39 U/L (ref 38–126)
Anion gap: 7 (ref 5–15)
BUN: 30 mg/dL — ABNORMAL HIGH (ref 8–23)
CO2: 33 mmol/L — ABNORMAL HIGH (ref 22–32)
Calcium: 8 mg/dL — ABNORMAL LOW (ref 8.9–10.3)
Chloride: 107 mmol/L (ref 98–111)
Creatinine, Ser: 0.87 mg/dL (ref 0.61–1.24)
GFR calc Af Amer: >60 mL/min (ref >60)
GFR calc non Af Amer: >60 mL/min (ref >60)
Glucose, Bld: 144 mg/dL — ABNORMAL HIGH (ref 70–99)
Potassium: 3.6 mmol/L (ref 3.5–5.1)
Sodium: 147 mmol/L — ABNORMAL HIGH (ref 135–145)
Total Bilirubin: 0.6 mg/dL (ref 0.3–1.2)
Total Protein: 5.7 g/dL — ABNORMAL LOW (ref 6.5–8.1)

## 2019-11-30 LAB — CULTURE, BLOOD (ROUTINE X 2)
Culture: NO GROWTH
Culture: NO GROWTH
Special Requests: ADEQUATE

## 2019-11-30 LAB — TRIGLYCERIDES: Triglycerides: 100 mg/dL (ref <150)

## 2019-11-30 MED ORDER — HYDRALAZINE HCL 20 MG/ML IJ SOLN
INTRAMUSCULAR | Status: AC
  Administered 2019-11-30: 10 mg via INTRAVENOUS
  Filled 2019-11-30: qty 1

## 2019-11-30 MED ORDER — SODIUM CHLORIDE 0.9 % IV SOLN
INTRAVENOUS | Status: DC
  Filled 2019-11-30: qty 640

## 2019-11-30 MED ORDER — PROPOFOL 1000 MG/100ML IV EMUL
5.0000 ug/kg/min | INTRAVENOUS | Status: DC
  Administered 2019-11-30: 17:00:00 20 ug/kg/min via INTRAVENOUS
  Administered 2019-11-30: 50 ug/kg/min via INTRAVENOUS
  Administered 2019-12-01: 30 ug/kg/min via INTRAVENOUS
  Administered 2019-12-01: 29.972 ug/kg/min via INTRAVENOUS
  Filled 2019-11-30 (×4): qty 100

## 2019-11-30 MED ORDER — HYDRALAZINE HCL 20 MG/ML IJ SOLN
10.0000 mg | Freq: Four times a day (QID) | INTRAMUSCULAR | Status: DC | PRN
  Administered 2019-11-30 – 2019-12-06 (×4): 10 mg via INTRAVENOUS
  Filled 2019-11-30 (×4): qty 1

## 2019-11-30 NOTE — Progress Notes (Signed)
Patient weaned this am. He became hypertensive, increased heart rate and respirations. Patient was diaphoretic and agitated, restarted sedation. Will continue to monitor.

## 2019-11-30 NOTE — Progress Notes (Signed)
[facility]  SUBJECTIVE: Patient intubated   Vitals:   11/30/19 1118 11/30/19 1256 11/30/19 1312 11/30/19 1400  BP:  (!) 171/104  117/77  Pulse:    99  Resp:    15  Temp:    (!) 100.9 F (38.3 C)  TempSrc:      SpO2: 95%  94% 96%  Weight:      Height:         Intake/Output Summary (Last 24 hours) at 11/30/2019 1428 Last data filed at 11/30/2019 1400 Gross per 24 hour  Intake 2203.27 ml  Output 6920 ml  Net -4716.73 ml      PHYSICAL EXAM  General: Intubated HEENT:  Normocephalic and atramatic Neck:  No JVD.  Lungs: Clear bilaterally to auscultation and percussion. Heart: Tachycardia. Normal S1 and S2 without gallops or murmurs.  Abdomen: Bowel sounds are positive, abdomen soft and non-tender  Msk:  Back normal, normal gait. Normal strength and tone for age. Extremities: No clubbing, cyanosis or edema.   Neuro: Sedated Psych: Sedated   LABS: Basic Metabolic Panel: Recent Labs    11/28/19 0345 11/29/19 0359 11/30/19 0428  NA 144 142 147*  K 3.2* 4.7 3.6  CL 109 109 107  CO2 30 30 33*  GLUCOSE 94 130* 144*  BUN 28* 27* 30*  CREATININE 0.75 0.74 0.87  CALCIUM 7.7* 7.7* 8.0*  MG 2.3  --   --   PHOS 3.1  --   --    Liver Function Tests: Recent Labs    11/29/19 0359 11/30/19 0428  AST 34 39  ALT 36 39  ALKPHOS 37* 39  BILITOT 0.7 0.6  PROT 5.1* 5.7*  ALBUMIN 2.3* 2.5*   No results for input(s): LIPASE, AMYLASE in the last 72 hours. CBC: Recent Labs    11/29/19 0359 11/30/19 0428  WBC 9.6 12.0*  NEUTROABS 7.7 8.3*  HGB 10.5* 12.0*  HCT 33.1* 35.5*  MCV 104.4* 98.1  PLT 113* 141*   Cardiac Enzymes: No results for input(s): CKTOTAL, CKMB, CKMBINDEX, TROPONINI in the last 72 hours. BNP: Invalid input(s): POCBNP D-Dimer: No results for input(s): DDIMER in the last 72 hours. Hemoglobin A1C: No results for input(s): HGBA1C in the last 72 hours. Fasting Lipid Panel: Recent Labs    11/30/19 0428  TRIG 100   Thyroid Function  Tests: No results for input(s): TSH, T4TOTAL, T3FREE, THYROIDAB in the last 72 hours.  Invalid input(s): FREET3 Anemia Panel: No results for input(s): VITAMINB12, FOLATE, FERRITIN, TIBC, IRON, RETICCTPCT in the last 72 hours.  DG Chest Port 1 View  Result Date: 11/30/2019 CLINICAL DATA:  Pulmonary disease. EXAM: PORTABLE CHEST 1 VIEW COMPARISON:  Radiograph yesterday. FINDINGS: Endotracheal tube is 3.3 cm from the carina. Enteric tube tip below the diaphragm not included in the field of view. Right internal jugular central venous catheter tip projects over the upper SVC. Hazy bilateral lung base opacities likely combination of pleural fluid and atelectasis/airspace disease, unchanged. There is vascular congestion. Heart is normal in size with unchanged mediastinal contours allowing for rotation. No pneumothorax. IMPRESSION: 1. Vascular congestion with unchanged hazy lung base opacities, likely combination of pleural effusions and atelectasis/airspace disease. 2. Stable support apparatus. Electronically Signed   By: Keith Rake M.D.   On: 11/30/2019 03:14   DG Chest Port 1 View  Result Date: 11/29/2019 CLINICAL DATA:  Acute respiratory failure. EXAM: PORTABLE CHEST 1 VIEW COMPARISON:  November 27, 2019. FINDINGS: Stable cardiomediastinal silhouette. Endotracheal and nasogastric tubes are unchanged in position.  No pneumothorax is noted. Increased bibasilar opacities are noted concerning for worsening atelectasis or edema with small right pleural effusion. Bony thorax is unremarkable. IMPRESSION: Stable support apparatus. Increased bibasilar opacities are noted concerning for worsening atelectasis or edema with small right pleural effusion. No pneumothorax is noted. Electronically Signed   By: Marijo Conception M.D.   On: 11/29/2019 07:58     Echo LVEF 50 to 55%  TELEMETRY: Sinus rhythm:  ASSESSMENT AND PLAN:  Active Problems:   Cardiac arrest [facility])   STEMI involving right coronary artery  ([place])    1.  Inferior STEMI, status post primary PCI, with DES RCA 2.  Cardiac arrest, hypothermia protocol 3.  Respiratory failure, multifactorial, still intubated 4.  Multisystem organ failure status post cardiac arrest 5.  Acute renal failure secondary to hypertension, improved 6.  Polysubstance abuse, with probable underlying withdrawal 7.  Possible sepsis/aspiration pneumonia, on wide spectrum antibiotics  Recommendations  1.  Agree with overall current therapy 2.  Continue dual antiplatelet therapy uninterrupted for 1 year 3.  No further cardiac diagnostics at this time   Isaias Cowman, MD, PhD, [facility] 11/30/2019 2:28 PM

## 2019-11-30 NOTE — TOC Progression Note (Signed)
Transition of Care [facility]) - Progression Note    Patient Details  Name: Kurt [name] MRN: [id number] Date of Birth: [birth year]  Transition of Care [facility]) CM/SW [place], RN Phone Number: 11/30/2019, 11:35 AM  Clinical Narrative:     Patient remains on a ventilator and sedated, RNCM to continue to monitor for needs       Expected Discharge Plan and Services                                                 Social Determinants of Health (SDOH) Interventions    Readmission Risk Interventions No flowsheet data found.

## 2019-11-30 NOTE — Progress Notes (Signed)
CRITICAL CARE PROGRESS NOTE    Name: Kurt [name] MRN: [id number] DOB: [birth year]     LOS: 7   SUBJECTIVE FINDINGS & SIGNIFICANT EVENTS   Patient description:   Pt with hx of polysubstance abuse,   Lines / Drains: PIVx2  Cultures / Sepsis markers: n/a  Antibiotics: zosyn   Protocols / Consultants: neurology  Tests / Events: EEG  Overnight: Aggitation with weaning of sedatives. Will initiate nourishment today via OGT   12/24-intubated/Vent support, cardiac arrest, s/p CATH-100% OCCLUSION OF RCA, COCAINE TOXICITY 12/24-HYPOTHERMIA PROTOCOL 12/24 - Critically ill, requiring 3 vasopressors, 100% FiO2 & 11PEEP 12/25 remains on pressors, on vent, fio2 100% 12/26 remains on hypothermia protocol 12/27 completed hypothermia protocol 12/28- remains with high fiO2 requirement and difficulty to wean 12/29 - recruitment maneuvers done, remains hypoxemic poorly responsive with weaning of sedation.  Discussed with son at bedside today.  12/30- patient remains with episodes of hypoglycemia. Plan is to diurese today as well as control CBG and BP better with SBP after 12/31 - post aggressive diuresis overnight, SBT today with plan for liberation from MV if pass.   PAST MEDICAL HISTORY   Past Medical History:  Diagnosis Date  . COPD (chronic obstructive pulmonary disease) ([place])   . ED (erectile dysfunction)   . Hypertension   . Lumbar disc disease      SURGICAL HISTORY   Past Surgical History:  Procedure Laterality Date  . APPENDECTOMY    . CORONARY STENT INTERVENTION N/A 11/23/2019   Procedure: CORONARY STENT INTERVENTION;  Surgeon: Yolonda Kida, MD;  Location: [facility];  Service: Cardiovascular;  Laterality: N/A;  RCA  . CORONARY/GRAFT ACUTE MI REVASCULARIZATION N/A  11/23/2019   Procedure: Coronary/Graft Acute MI Revascularization;  Surgeon: Yolonda Kida, MD;  Location: [facility];  Service: Cardiovascular;  Laterality: N/A;  . LEFT HEART CATH AND CORONARY ANGIOGRAPHY N/A 11/23/2019   Procedure: LEFT HEART CATH AND CORONARY ANGIOGRAPHY;  Surgeon: Yolonda Kida, MD;  Location: [facility];  Service: Cardiovascular;  Laterality: N/A;     FAMILY HISTORY   Family History  Problem Relation Age of Onset  . Hyperlipidemia Father   . Cancer Father   . Heart disease Father        heart valve disease (from Agent Orange?)  . Alcohol abuse Mother   . Hypertension Neg Hx   . Diabetes Neg Hx   . Colon cancer Neg Hx   . Colon polyps Neg Hx   . Pancreatic cancer Neg Hx   . Rectal cancer Neg Hx   . Stomach cancer Neg Hx      SOCIAL HISTORY   Social History   Tobacco Use  . Smoking status: Current Every Day Smoker  . Smokeless tobacco: Never Used  Substance Use Topics  . Alcohol use: Yes  . Drug use: Yes    Types: Marijuana     MEDICATIONS   Current Medication:  Current Facility-Administered Medications:  .  0.9 %  sodium chloride infusion, , Intravenous, Continuous, Funke, Mary E, MD .  0.9 %  sodium chloride infusion, , Intravenous, Continuous, Kasa, Kurian, MD .  acetaminophen (TYLENOL) tablet 650 mg, 650 mg, Oral, Q4H PRN, Flora Lipps, MD .  acetaminophen (TYLENOL) tablet 650 mg, 650 mg, Oral, Q4H PRN, Callwood, Dwayne D, MD .  albuterol (PROVENTIL) (2.5 MG/3ML) 0.083% nebulizer solution 2.5 mg, 2.5 mg, Nebulization, Q2H PRN, Mortimer Fries, Kurian, MD .  aspirin chewable tablet 81 mg, 81 mg,  Per Tube, Daily, Benita Gutter, RPH, 81 mg at 11/30/19 0930 .  atropine 1 MG/10ML injection 1 mg, 1 mg, Intravenous, PRN, Awilda Bill, NP .  cefTRIAXone (ROCEPHIN) 2 g in sodium chloride 0.9 % 100 mL IVPB, 2 g, Intravenous, Q12H, Dillan Candela, MD, Last Rate: 200 mL/hr at 11/30/19 1029, 2 g at 11/30/19 1029 .   chlorhexidine gluconate (MEDLINE KIT) (PERIDEX) 0.12 % solution 15 mL, 15 mL, Mouth Rinse, BID, [name] D, NP, 15 mL at 11/30/19 0728 .  Chlorhexidine Gluconate Cloth 2 % PADS 6 each, 6 each, Topical, Daily, [name], NP, 6 each at 11/29/19 [phone] .  dexmedetomidine (PRECEDEX) 400 MCG/100ML (4 mcg/mL) infusion, 0.4-1.2 mcg/kg/hr, Intravenous, Titrated, Diogo Anne, MD, Last Rate: 14.39 mL/hr at 11/30/19 1025, 0.6 mcg/kg/hr at 11/30/19 1025 .  dextrose 50 % solution 50 mL, 1 ampule, Intravenous, Once, Blakeney, Dreama Saa, NP .  enoxaparin (LOVENOX) injection 40 mg, 40 mg, Subcutaneous, Q24H, Momin Misko, MD, 40 mg at 11/30/19 0930 .  famotidine (PEPCID) IVPB 20 mg premix, 20 mg, Intravenous, Q12H, Kasa, Kurian, MD, Last Rate: 100 mL/hr at 11/30/19 0930, 20 mg at 11/30/19 0930 .  feeding supplement (PIVOT 1.5 CAL) liquid 1,000 mL, 1,000 mL, Per Tube, Continuous, Ottie Glazier, MD, Stopped at 11/29/19 1600 .  fentaNYL (SUBLIMAZE) bolus via infusion 50 mcg, 50 mcg, Intravenous, Q15 min PRN, [name] D, NP, 50 mcg at 11/25/19 2220 .  fentaNYL (SUBLIMAZE) injection 50 mcg, 50 mcg, Intravenous, Once, [name] D, NP .  fentaNYL 2568mg in NS 2562m(1012mml) infusion-PREMIX, 0-400 mcg/hr, Intravenous, Continuous, Blakeney, DanDreama SaaP, Last Rate: 20 mL/hr at 11/30/19 0537, 200 mcg/hr at 11/30/19 0537 .  hydrALAZINE (APRESOLINE) injection 10 mg, 10 mg, Intravenous, Q6H PRN, KeeDarel Hong NP, 10 mg at 11/30/19 0053 .  ipratropium-albuterol (DUONEB) 0.5-2.5 (3) MG/3ML nebulizer solution 3 mL, 3 mL, Nebulization, Q4H, Kasa, Kurian, MD, 3 mL at 11/30/19 0752 .  MEDLINE mouth rinse, 15 mL, Mouth Rinse, 10 times per day, KeeDarel Hong NP, 15 mL at 11/30/19 0932 .  methylPREDNISolone sodium succinate (SOLU-MEDROL) 40 mg/mL injection 40 mg, 40 mg, Intravenous, Q24H, Mackenzye Mackel, MD, 40 mg at 11/30/19 0930 .  midazolam (VERSED) injection 2 mg, 2 mg, Intravenous, Q2H PRN,  KeeDarel Hong NP, 2 mg at 11/30/19 0645 .  multivitamin liquid 15 mL, 15 mL, Per Tube, Daily, AleLanney Ginsuad, MD, 15 mL at 11/30/19 0930 .  ondansetron (ZOFRAN) injection 4 mg, 4 mg, Intravenous, Q6H PRN, Callwood, Dwayne D, MD .  rosuvastatin (CRESTOR) tablet 40 mg, 40 mg, Per Tube, q1800, AleOttie GlazierD, 40 mg at 11/29/19 1806 .  sodium chloride 0.9 % 640 mL with dextrose 250 g infusion, , Intravenous, Continuous, Kasa, Kurian, MD .  sodium chloride flush (NS) 0.9 % injection 3 mL, 3 mL, Intravenous, PRN, Callwood, Dwayne D, MD, 3 mL at 11/30/19 0931 .  ticagrelor (BRILINTA) tablet 90 mg, 90 mg, Per Tube, BID, ChaBenita GutterPH, 90 mg at 11/30/19 0930 .  vecuronium (NORCURON) injection 10 mg, 10 mg, Intravenous, Q2H PRN, KeeDarel Hong NP, 10 mg at 11/24/19 2306    ALLERGIES   Patient has no known allergies.    REVIEW OF SYSTEMS     Unable to obtain due to sedation   PHYSICAL EXAMINATION   Vital Signs: Temp:  [97.7 F (36.5 C)-99.1 F (37.3 C)] 99.1 F (37.3 C) (12/31 0900) Pulse Rate:  [56-116] 63 (12/31 0725) Resp:  [  13-23] 18 (12/31 0900) BP: (108-211)/(58-128) 128/81 (12/31 0900) SpO2:  [95 %-100 %] 95 % (12/31 0900) Arterial Line BP: (106-214)/(63-130) 125/80 (12/31 0700) FiO2 (%):  [45 %] 45 % (12/31 0810) Weight:  [96.2 kg] 96.2 kg (12/31 0429)  GENERAL:chronically ill appearing HEAD: Normocephalic, atraumatic.  EYES: Pupils equal, round, reactive to light.  No scleral icterus.  MOUTH: Moist mucosal membrane. NECK: Supple. No thyromegaly. No nodules. No JVD.  PULMONARY: Bibasilar crackles  CARDIOVASCULAR: S1 and S2. Regular rate and rhythm. No murmurs, rubs, or gallops.  GASTROINTESTINAL: Soft, nontender, non-distended. No masses. Positive bowel sounds. No hepatosplenomegaly.  MUSCULOSKELETAL: No swelling, clubbing, or edema.  NEUROLOGIC: Mild distress due to acute illness SKIN:intact,warm,dry   PERTINENT DATA     Infusions: . sodium  chloride    . sodium chloride    . cefTRIAXone (ROCEPHIN)  IV 2 g (11/30/19 1029)  . dexmedetomidine (PRECEDEX) IV infusion 0.6 mcg/kg/hr (11/30/19 1025)  . famotidine (PEPCID) IV 20 mg (11/30/19 0930)  . feeding supplement (PIVOT 1.5 CAL) Stopped (11/29/19 1600)  . fentaNYL infusion INTRAVENOUS 200 mcg/hr (11/30/19 0537)  . sodium chloride 0.9 % 640 mL with dextrose 250 g infusion     Scheduled Medications: . aspirin  81 mg Per Tube Daily  . chlorhexidine gluconate (MEDLINE KIT)  15 mL Mouth Rinse BID  . Chlorhexidine Gluconate Cloth  6 each Topical Daily  . dextrose  1 ampule Intravenous Once  . enoxaparin (LOVENOX) injection  40 mg Subcutaneous Q24H  . fentaNYL (SUBLIMAZE) injection  50 mcg Intravenous Once  . ipratropium-albuterol  3 mL Nebulization Q4H  . mouth rinse  15 mL Mouth Rinse 10 times per day  . methylPREDNISolone (SOLU-MEDROL) injection  40 mg Intravenous Q24H  . multivitamin  15 mL Per Tube Daily  . rosuvastatin  40 mg Per Tube q1800  . ticagrelor  90 mg Per Tube BID   PRN Medications: acetaminophen, acetaminophen, albuterol, atropine, fentaNYL, hydrALAZINE, midazolam, ondansetron (ZOFRAN) IV, sodium chloride flush, vecuronium Hemodynamic parameters:   Intake/Output: 12/30 0701 - 12/31 0700 In: 2200.3 [I.V.:1674.4; NG/GT:225.8; IV ONGEXBMWU:132] Out: 4401 [Urine:5970; Stool:200]  Ventilator  Settings: Vent Mode: PRVC FiO2 (%):  [45 %] 45 % Set Rate:  [18 bmp] 18 bmp Vt Set:  [550 mL] 550 mL PEEP:  [10 cmH20] 10 cmH20   Other Labs:     LAB RESULTS:  Basic Metabolic Panel: Recent Labs  Lab 11/23/19 1624 11/25/19 0418 11/26/19 0422 11/27/19 0403 11/28/19 0345 11/29/19 0359 11/30/19 0428  NA  --  143 145 143 144 142 147*  K  --  3.2* 3.0* 3.0* 3.2* 4.7 3.6  CL  --  103 104 106 109 109 107  CO2  --  25 32 _0 33*  GLUCOSE  --  150* 119* 99 94 130* 144*  BUN  --  28* 24* 25* 28* 27* 30*  CREATININE  --  1.26* 0.99 0.89 0.75 0.74 0.87   CALCIUM  --  6.6* 7.0* 7.4* 7.7* 7.7* 8.0*  MG 2.3 1.8  --   --  2.3  --   --   PHOS  --   --   --   --  3.1  --   --    Liver Function Tests: Recent Labs  Lab 11/26/19 0422 11/27/19 0403 11/28/19 0345 11/29/19 0359 11/30/19 0428  AST 105* 70* 45* 34 39  ALT 53* 46* 39 36 39  ALKPHOS 36* 37* 36* 37* 39  BILITOT 0.9 0.9 0.7  0.7 0.6  PROT 4.9* 5.1* 4.9* 5.1* 5.7*  ALBUMIN 2.5* 2.4* 2.3* 2.3* 2.5*   No results for input(s): LIPASE, AMYLASE in the last 168 hours. No results for input(s): AMMONIA in the last 168 hours. CBC: Recent Labs  Lab 11/26/19 0422 11/27/19 0403 11/28/19 0345 11/29/19 0359 11/30/19 0428  WBC 14.8* 12.0* 10.5 9.6 12.0*  NEUTROABS 13.0* 9.8* 6.9 7.7 8.3*  HGB 10.6* 10.5* 10.4* 10.5* 12.0*  HCT 32.5* 31.7* 32.6* 33.1* 35.5*  MCV 100.6* 101.0* 103.5* 104.4* 98.1  PLT 117* 123* 121* 113* 141*   Cardiac Enzymes: No results for input(s): CKTOTAL, CKMB, CKMBINDEX, TROPONINI in the last 168 hours. BNP: Invalid input(s): POCBNP CBG: Recent Labs  Lab 11/29/19 1528 11/29/19 1919 11/29/19 2325 11/30/19 0425 11/30/19 0747  GLUCAP 167* 202* 161* 149* 139*     IMAGING RESULTS:  Imaging: DG Chest Port 1 View  Result Date: 11/30/2019 CLINICAL DATA:  Pulmonary disease. EXAM: PORTABLE CHEST 1 VIEW COMPARISON:  Radiograph yesterday. FINDINGS: Endotracheal tube is 3.3 cm from the carina. Enteric tube tip below the diaphragm not included in the field of view. Right internal jugular central venous catheter tip projects over the upper SVC. Hazy bilateral lung base opacities likely combination of pleural fluid and atelectasis/airspace disease, unchanged. There is vascular congestion. Heart is normal in size with unchanged mediastinal contours allowing for rotation. No pneumothorax. IMPRESSION: 1. Vascular congestion with unchanged hazy lung base opacities, likely combination of pleural effusions and atelectasis/airspace disease. 2. Stable support apparatus.  Electronically Signed   By: Keith Rake M.D.   On: 11/30/2019 03:14   DG Chest Port 1 View  Result Date: 11/29/2019 CLINICAL DATA:  Acute respiratory failure. EXAM: PORTABLE CHEST 1 VIEW COMPARISON:  November 27, 2019. FINDINGS: Stable cardiomediastinal silhouette. Endotracheal and nasogastric tubes are unchanged in position. No pneumothorax is noted. Increased bibasilar opacities are noted concerning for worsening atelectasis or edema with small right pleural effusion. Bony thorax is unremarkable. IMPRESSION: Stable support apparatus. Increased bibasilar opacities are noted concerning for worsening atelectasis or edema with small right pleural effusion. No pneumothorax is noted. Electronically Signed   By: Marijo Conception M.D.   On: 11/29/2019 07:58      ASSESSMENT AND PLAN      63 yo white male with acute and severe hypoxic respiratory failure from acute V fib cardiac arrest and sudden cardiac deathSTEMI from acute RCA OCCLUSION andfrom COCAINE Toxicity. Now with Cardiogenic shock requiring multiple vasopressors. Concern for possible aspiration pneumonia.   Severe acute hypoxic and hypercapnic respiratory failure Continue full mechanical support Continue BD therapy Wean FiO2 and PEEP as tolerated CXR with worsening airspace opacities - possibly due to net + fluid balance currently at +10L.  Will diurese today. Have changed fluids to D25at 30/hr due to hypoglycemia.     Acute MI and cardiac failure-STEMI and cocaine abuse Follow-up cardiology recommendations Completed hypothermia protocol Antiplatelet agent therapy as per cardiology   Acute kidney injury and renal failure Follow Chem-7 Follow urine output Foley as needed Avoid nephrotoxic agents   Hypoglycemia   - continue D10 gtt   - OG feeds initiated and CBGs imporved - will hold for now due to SBP  Neurology Completed hypothermia protocol Plan for sedation vacation and assess neurological status We  will need to consider CT or MRI for further assessment   Cardiac Acute MI Follow cardiology recommendations Continue ICU monitoring  Infectious disease Continue antibiotics for aspiration pneumonia   GI GI PROPHYLAXIS as indicated  NUTRITIONAL STATUS DIET-->TF's as tolerated Constipation protocol as indicated  ELECTROLYTES -follow labs as needed -replace as needed -pharmacy consultation and following   ENDO - will use ICU hypoglycemic\Hyperglycemia protocol if indicated    DVT/GI PRX ordered TRANSFUSIONS AS NEEDED MONITOR FSBS ASSESS the need for LABS as needed  S as needed   Critical care provider statement:    Critical care time (minutes):  32   Critical care time was exclusive of:  Separately billable procedures and treating other patients   Critical care was necessary to treat or prevent imminent or life-threatening deterioration of the following conditions:   Acute hypoxemic hypercapnic respiratory failure, cardiac arrest, cocaine abuse, acute kidney injury, multiple comorbid conditions   Critical care was time spent personally by me on the following activities:  Development of treatment plan with patient or surrogate, discussions with consultants, evaluation of patient's response to treatment, examination of patient, obtaining history from patient or surrogate, ordering and performing treatments and interventions, ordering and review of laboratory studies and re-evaluation of patient's condition.  I assumed direction of critical care for this patient from another provider in my specialty: no    This document was prepared using Dragon voice recognition software and may include unintentional dictation errors.    Ottie Glazier, M.D.  Division of [facility]

## 2019-12-01 LAB — GLUCOSE, CAPILLARY
Glucose-Capillary: 101 mg/dL — ABNORMAL HIGH (ref 70–99)
Glucose-Capillary: 107 mg/dL — ABNORMAL HIGH (ref 70–99)
Glucose-Capillary: 122 mg/dL — ABNORMAL HIGH (ref 70–99)
Glucose-Capillary: 122 mg/dL — ABNORMAL HIGH (ref 70–99)
Glucose-Capillary: 122 mg/dL — ABNORMAL HIGH (ref 70–99)
Glucose-Capillary: 129 mg/dL — ABNORMAL HIGH (ref 70–99)

## 2019-12-01 LAB — CBC WITH DIFFERENTIAL/PLATELET
Abs Immature Granulocytes: 0.23 10*3/uL — ABNORMAL HIGH (ref 0.00–0.07)
Basophils Absolute: 0 10*3/uL (ref 0.0–0.1)
Basophils Relative: 0 %
Eosinophils Absolute: 0.1 10*3/uL (ref 0.0–0.5)
Eosinophils Relative: 1 %
HCT: 35.4 % — ABNORMAL LOW (ref 39.0–52.0)
Hemoglobin: 11.5 g/dL — ABNORMAL LOW (ref 13.0–17.0)
Immature Granulocytes: 2 %
Lymphocytes Relative: 16 %
Lymphs Abs: 2.2 10*3/uL (ref 0.7–4.0)
MCH: 32.5 pg (ref 26.0–34.0)
MCHC: 32.5 g/dL (ref 30.0–36.0)
MCV: 100 fL (ref 80.0–100.0)
Monocytes Absolute: 0.9 10*3/uL (ref 0.1–1.0)
Monocytes Relative: 7 %
Neutro Abs: 10.2 10*3/uL — ABNORMAL HIGH (ref 1.7–7.7)
Neutrophils Relative %: 74 %
Platelets: 157 10*3/uL (ref 150–400)
RBC: 3.54 MIL/uL — ABNORMAL LOW (ref 4.22–5.81)
RDW: 13.3 % (ref 11.5–15.5)
WBC: 13.7 10*3/uL — ABNORMAL HIGH (ref 4.0–10.5)
nRBC: 0 % (ref 0.0–0.2)

## 2019-12-01 LAB — BASIC METABOLIC PANEL
Anion gap: 7 (ref 5–15)
BUN: 31 mg/dL — ABNORMAL HIGH (ref 8–23)
CO2: 30 mmol/L (ref 22–32)
Calcium: 8.1 mg/dL — ABNORMAL LOW (ref 8.9–10.3)
Chloride: 111 mmol/L (ref 98–111)
Creatinine, Ser: 0.61 mg/dL (ref 0.61–1.24)
GFR calc Af Amer: >60 mL/min (ref >60)
GFR calc non Af Amer: >60 mL/min (ref >60)
Glucose, Bld: 155 mg/dL — ABNORMAL HIGH (ref 70–99)
Potassium: 3.2 mmol/L — ABNORMAL LOW (ref 3.5–5.1)
Sodium: 148 mmol/L — ABNORMAL HIGH (ref 135–145)

## 2019-12-01 MED ORDER — CHLORHEXIDINE GLUCONATE CLOTH 2 % EX PADS
6.0000 | MEDICATED_PAD | Freq: Every day | CUTANEOUS | Status: DC
  Administered 2019-12-01 – 2019-12-10 (×8): 6 via TOPICAL

## 2019-12-01 MED ORDER — GLYCOPYRROLATE 0.2 MG/ML IJ SOLN
0.2000 mg | INTRAMUSCULAR | Status: DC | PRN
  Administered 2019-12-01: 0.2 mg via INTRAVENOUS
  Filled 2019-12-01 (×2): qty 1

## 2019-12-01 MED ORDER — FUROSEMIDE 10 MG/ML IJ SOLN
40.0000 mg | Freq: Two times a day (BID) | INTRAMUSCULAR | Status: DC
  Administered 2019-12-01 – 2019-12-02 (×2): 40 mg via INTRAVENOUS
  Filled 2019-12-01 (×3): qty 4

## 2019-12-01 NOTE — Progress Notes (Signed)
Patient very agitated with stimulation. He is currently on avaps vt 500 r20 max/min pressure 25/12 35%.  Tolerating ok as long as he is not disturbed.

## 2019-12-01 NOTE — Progress Notes (Signed)
[facility]  SUBJECTIVE: Intubated   Vitals:   12/01/19 0333 12/01/19 0337 12/01/19 0400 12/01/19 0500  BP:  116/75 102/62 112/66  Pulse:  89 88 81  Resp:  20 17 18   Temp:  99.1 F (37.3 C) 99.1 F (37.3 C) 99 F (37.2 C)  TempSrc:  Bladder    SpO2:  (!) 88% 100% 98%  Weight: 93.6 kg     Height:         Intake/Output Summary (Last 24 hours) at 12/01/2019 0830 Last data filed at 12/01/2019 [id number] Gross per 24 hour  Intake 1787.39 ml  Output 2250 ml  Net -462.61 ml      PHYSICAL EXAM  General: Intubated HEENT:  Normocephalic and atramatic Neck:  No JVD.  Lungs: Clear bilaterally to auscultation and percussion. Heart: HRRR . Normal S1 and S2 without gallops or murmurs.  Abdomen: Bowel sounds are positive, abdomen soft and non-tender  Msk:  Back normal, normal gait. Normal strength and tone for age. Extremities: No clubbing, cyanosis or edema.   Neuro: Sedated Psych: Sedated   LABS: Basic Metabolic Panel: Recent Labs    11/30/19 0428 12/01/19 0359  NA 147* 148*  K 3.6 3.2*  CL 107 111  CO2 33* 30  GLUCOSE 144* 155*  BUN 30* 31*  CREATININE 0.87 0.61  CALCIUM 8.0* 8.1*   Liver Function Tests: Recent Labs    11/29/19 0359 11/30/19 0428  AST 34 39  ALT 36 39  ALKPHOS 37* 39  BILITOT 0.7 0.6  PROT 5.1* 5.7*  ALBUMIN 2.3* 2.5*   No results for input(s): LIPASE, AMYLASE in the last 72 hours. CBC: Recent Labs    11/30/19 0428 12/01/19 0359  WBC 12.0* 13.7*  NEUTROABS 8.3* 10.2*  HGB 12.0* 11.5*  HCT 35.5* 35.4*  MCV 98.1 100.0  PLT 141* 157   Cardiac Enzymes: No results for input(s): CKTOTAL, CKMB, CKMBINDEX, TROPONINI in the last 72 hours. BNP: Invalid input(s): POCBNP D-Dimer: No results for input(s): DDIMER in the last 72 hours. Hemoglobin A1C: No results for input(s): HGBA1C in the last 72 hours. Fasting Lipid Panel: Recent Labs    11/30/19 0428  TRIG 100   Thyroid Function Tests: No results for input(s): TSH, T4TOTAL, T3FREE,  THYROIDAB in the last 72 hours.  Invalid input(s): FREET3 Anemia Panel: No results for input(s): VITAMINB12, FOLATE, FERRITIN, TIBC, IRON, RETICCTPCT in the last 72 hours.  DG Chest Port 1 View  Result Date: 11/30/2019 CLINICAL DATA:  Pulmonary disease. EXAM: PORTABLE CHEST 1 VIEW COMPARISON:  Radiograph yesterday. FINDINGS: Endotracheal tube is 3.3 cm from the carina. Enteric tube tip below the diaphragm not included in the field of view. Right internal jugular central venous catheter tip projects over the upper SVC. Hazy bilateral lung base opacities likely combination of pleural fluid and atelectasis/airspace disease, unchanged. There is vascular congestion. Heart is normal in size with unchanged mediastinal contours allowing for rotation. No pneumothorax. IMPRESSION: 1. Vascular congestion with unchanged hazy lung base opacities, likely combination of pleural effusions and atelectasis/airspace disease. 2. Stable support apparatus. Electronically Signed   By: Keith Rake M.D.   On: 11/30/2019 03:14     Echo LVEF 50 to 55%  TELEMETRY: Sinus rhythm at 77 bpm:  ASSESSMENT AND PLAN:  Active Problems:   Cardiac arrest [facility])   STEMI involving right coronary artery ([place])    1.  Inferior STEMI, status post primary PCI with DES RCA 2.  Cardiac arrest, hypothermia protocol 3.  Respiratory failure, multifactorial,  still intubated, difficulty to wean 4.  Multisystem organ failure status post cardiac arrest 5.  Acute renal failure secondary to cardiac arrest, improved 6.  Polysubstance abuse, with probable underlying withdrawal symptoms 7.  Possible/sepsis aspiration pneumonia, on broad-spectrum antibiotics  Recommendations  1.  Agree with overall current therapy 2.  Continue dual antiplatelet therapy uninterrupted for 1 year 3.  No further cardiac diagnostics at this time   Kurt Cowman, MD, PhD, [facility] 12/01/2019 8:30 AM

## 2019-12-01 NOTE — Progress Notes (Signed)
CRITICAL CARE PROGRESS NOTE    Name: Kurt [name] MRN: [id number] DOB: 1956-01-06     LOS: 8   SUBJECTIVE FINDINGS & SIGNIFICANT EVENTS   Patient description:   Pt with hx of polysubstance abuse,   Lines / Drains: PIVx2  Cultures / Sepsis markers: n/a  Antibiotics: zosyn   Protocols / Consultants: neurology  Tests / Events: EEG  Overnight: Aggitation with weaning of sedatives. Will initiate nourishment today via OGT   12/24-intubated/Vent support, cardiac arrest, s/p CATH-100% OCCLUSION OF RCA, COCAINE TOXICITY 12/24-HYPOTHERMIA PROTOCOL 12/24 - Critically ill, requiring 3 vasopressors, 100% FiO2 & 11PEEP 12/25 remains on pressors, on vent, fio2 100% 12/26 remains on hypothermia protocol 12/27 completed hypothermia protocol 12/28- remains with high fiO2 requirement and difficulty to wean 12/29 - recruitment maneuvers done, remains hypoxemic poorly responsive with weaning of sedation.  Discussed with son at bedside today.  12/30- patient remains with episodes of hypoglycemia. Plan is to diurese today as well as control CBG and BP better with SBP after 12/31 - post aggressive diuresis overnight, SBT today with plan for liberation from MV if pass.  1/1 - patient is more awake , he will have another weaning SBT trial today   PAST MEDICAL HISTORY   Past Medical History:  Diagnosis Date  . COPD (chronic obstructive pulmonary disease) ([place])   . ED (erectile dysfunction)   . Hypertension   . Lumbar disc disease      SURGICAL HISTORY   Past Surgical History:  Procedure Laterality Date  . APPENDECTOMY    . CORONARY STENT INTERVENTION N/A 11/23/2019   Procedure: CORONARY STENT INTERVENTION;  Surgeon: Yolonda Kida, MD;  Location: [facility];  Service: Cardiovascular;   Laterality: N/A;  RCA  . CORONARY/GRAFT ACUTE MI REVASCULARIZATION N/A 11/23/2019   Procedure: Coronary/Graft Acute MI Revascularization;  Surgeon: Yolonda Kida, MD;  Location: [facility];  Service: Cardiovascular;  Laterality: N/A;  . LEFT HEART CATH AND CORONARY ANGIOGRAPHY N/A 11/23/2019   Procedure: LEFT HEART CATH AND CORONARY ANGIOGRAPHY;  Surgeon: Yolonda Kida, MD;  Location: [facility];  Service: Cardiovascular;  Laterality: N/A;     FAMILY HISTORY   Family History  Problem Relation Age of Onset  . Hyperlipidemia Father   . Cancer Father   . Heart disease Father        heart valve disease (from Agent Orange?)  . Alcohol abuse Mother   . Hypertension Neg Hx   . Diabetes Neg Hx   . Colon cancer Neg Hx   . Colon polyps Neg Hx   . Pancreatic cancer Neg Hx   . Rectal cancer Neg Hx   . Stomach cancer Neg Hx      SOCIAL HISTORY   Social History   Tobacco Use  . Smoking status: Current Every Day Smoker  . Smokeless tobacco: Never Used  Substance Use Topics  . Alcohol use: Yes  . Drug use: Yes    Types: Marijuana     MEDICATIONS   Current Medication:  Current Facility-Administered Medications:  .  0.9 %  sodium chloride infusion, , Intravenous, Continuous, Funke, Mary E, MD .  0.9 %  sodium chloride infusion, , Intravenous, Continuous, Kasa, Kurian, MD .  acetaminophen (TYLENOL) tablet 650 mg, 650 mg, Oral, Q4H PRN, Flora Lipps, MD .  acetaminophen (TYLENOL) tablet 650 mg, 650 mg, Oral, Q4H PRN, Callwood, Dwayne D, MD .  albuterol (PROVENTIL) (2.5 MG/3ML) 0.083% nebulizer solution 2.5 mg, 2.5  mg, Nebulization, Q2H PRN, Kasa, Kurian, MD .  aspirin chewable tablet 81 mg, 81 mg, Per Tube, Daily, Benita Gutter, RPH, 81 mg at 12/01/19 1001 .  atropine 1 MG/10ML injection 1 mg, 1 mg, Intravenous, PRN, Awilda Bill, NP .  cefTRIAXone (ROCEPHIN) 2 g in sodium chloride 0.9 % 100 mL IVPB, 2 g, Intravenous, Q12H, Angeletta Goelz, MD,  Last Rate: 200 mL/hr at 12/01/19 1035, 2 g at 12/01/19 1035 .  chlorhexidine gluconate (MEDLINE KIT) (PERIDEX) 0.12 % solution 15 mL, 15 mL, Mouth Rinse, BID, [name] D, NP, 15 mL at 12/01/19 0723 .  Chlorhexidine Gluconate Cloth 2 % PADS 6 each, 6 each, Topical, Daily, Tekila Caillouet, MD .  dexmedetomidine (PRECEDEX) 400 MCG/100ML (4 mcg/mL) infusion, 0.4-1.2 mcg/kg/hr, Intravenous, Titrated, Brigetta Beckstrom, MD, Last Rate: 24 mL/hr at 12/01/19 1034, 1 mcg/kg/hr at 12/01/19 1034 .  dextrose 50 % solution 50 mL, 1 ampule, Intravenous, Once, Blakeney, Dreama Saa, NP .  enoxaparin (LOVENOX) injection 40 mg, 40 mg, Subcutaneous, Q24H, Lanney Gins, Darwyn Ponzo, MD, 40 mg at 12/01/19 1001 .  famotidine (PEPCID) IVPB 20 mg premix, 20 mg, Intravenous, Q12H, Kasa, Kurian, MD, Last Rate: 100 mL/hr at 12/01/19 1002, 20 mg at 12/01/19 1002 .  feeding supplement (PIVOT 1.5 CAL) liquid 1,000 mL, 1,000 mL, Per Tube, Continuous, Sage Hammill, MD, Last Rate: 50 mL/hr at 12/01/19 1033, 1,000 mL at 12/01/19 1033 .  fentaNYL (SUBLIMAZE) bolus via infusion 50 mcg, 50 mcg, Intravenous, Q15 min PRN, [name] D, NP, 50 mcg at 11/25/19 2220 .  fentaNYL (SUBLIMAZE) injection 50 mcg, 50 mcg, Intravenous, Once, [name] D, NP .  fentaNYL 2534mg in NS 2545m(1061mml) infusion-PREMIX, 0-400 mcg/hr, Intravenous, Continuous, BlaAwilda BillP, Stopped at 11/30/19 [id number] hydrALAZINE (APRESOLINE) injection 10 mg, 10 mg, Intravenous, Q6H PRN, KeeDarel Hong NP, 10 mg at 11/30/19 1256 .  ipratropium-albuterol (DUONEB) 0.5-2.5 (3) MG/3ML nebulizer solution 3 mL, 3 mL, Nebulization, Q4H, Kasa, Kurian, MD, 3 mL at 12/01/19 1124 .  MEDLINE mouth rinse, 15 mL, Mouth Rinse, 10 times per day, KeeDarel Hong NP, 15 mL at 12/01/19 1002 .  methylPREDNISolone sodium succinate (SOLU-MEDROL) 40 mg/mL injection 40 mg, 40 mg, Intravenous, Q24H, Jennine Peddy, MD, 40 mg at 12/01/19 1001 .  midazolam (VERSED) injection 2  mg, 2 mg, Intravenous, Q2H PRN, KeeDarel Hong NP, 2 mg at 11/30/19 0645 .  multivitamin liquid 15 mL, 15 mL, Per Tube, Daily, AleLanney Ginsuad, MD, 15 mL at 12/01/19 1001 .  ondansetron (ZOFRAN) injection 4 mg, 4 mg, Intravenous, Q6H PRN, Callwood, Dwayne D, MD .  propofol (DIPRIVAN) 1000 MG/100ML infusion, 5-80 mcg/kg/min, Intravenous, Titrated, Trenyce Loera, MD, Last Rate: 8.63 mL/hr at 12/01/19 1000, 15 mcg/kg/min at 12/01/19 1000 .  rosuvastatin (CRESTOR) tablet 40 mg, 40 mg, Per Tube, q1800, AleOttie GlazierD, 40 mg at 11/30/19 1701 .  sodium chloride flush (NS) 0.9 % injection 3 mL, 3 mL, Intravenous, PRN, Callwood, Dwayne D, MD, 3 mL at 11/30/19 0931 .  ticagrelor (BRILINTA) tablet 90 mg, 90 mg, Per Tube, BID, ChaBenita GutterPH, 90 mg at 12/01/19 1001 .  vecuronium (NORCURON) injection 10 mg, 10 mg, Intravenous, Q2H PRN, KeeDarel Hong NP, 10 mg at 11/24/19 2306    ALLERGIES   Patient has no known allergies.    REVIEW OF SYSTEMS     Unable to obtain due to sedation   PHYSICAL EXAMINATION   Vital Signs: Temp:  [98.6 F (37 C)-100.9  F (38.3 C)] 99.5 F (37.5 C) (01/01 1100) Pulse Rate:  [58-136] 76 (01/01 1100) Resp:  [15-31] 19 (01/01 1100) BP: (102-171)/(52-104) 139/75 (01/01 1100) SpO2:  [88 %-100 %] 100 % (01/01 1100) FiO2 (%):  [45 %] 45 % (01/01 0732) Weight:  [93.6 kg] 93.6 kg (01/01 0333)  GENERAL:chronically ill appearing HEAD: Normocephalic, atraumatic.  EYES: Pupils equal, round, reactive to light.  No scleral icterus.  MOUTH: Moist mucosal membrane. NECK: Supple. No thyromegaly. No nodules. No JVD.  PULMONARY: Bibasilar crackles  CARDIOVASCULAR: S1 and S2. Regular rate and rhythm. No murmurs, rubs, or gallops.  GASTROINTESTINAL: Soft, nontender, non-distended. No masses. Positive bowel sounds. No hepatosplenomegaly.  MUSCULOSKELETAL: No swelling, clubbing, or edema.  NEUROLOGIC: Mild distress due to acute  illness SKIN:intact,warm,dry   PERTINENT DATA     Infusions: . sodium chloride    . sodium chloride    . cefTRIAXone (ROCEPHIN)  IV 2 g (12/01/19 1035)  . dexmedetomidine (PRECEDEX) IV infusion 1 mcg/kg/hr (12/01/19 1034)  . famotidine (PEPCID) IV 20 mg (12/01/19 1002)  . feeding supplement (PIVOT 1.5 CAL) 1,000 mL (12/01/19 1033)  . fentaNYL infusion INTRAVENOUS Stopped (11/30/19 4944)  . propofol (DIPRIVAN) infusion 15 mcg/kg/min (12/01/19 1000)   Scheduled Medications: . aspirin  81 mg Per Tube Daily  . chlorhexidine gluconate (MEDLINE KIT)  15 mL Mouth Rinse BID  . Chlorhexidine Gluconate Cloth  6 each Topical Daily  . dextrose  1 ampule Intravenous Once  . enoxaparin (LOVENOX) injection  40 mg Subcutaneous Q24H  . fentaNYL (SUBLIMAZE) injection  50 mcg Intravenous Once  . ipratropium-albuterol  3 mL Nebulization Q4H  . mouth rinse  15 mL Mouth Rinse 10 times per day  . methylPREDNISolone (SOLU-MEDROL) injection  40 mg Intravenous Q24H  . multivitamin  15 mL Per Tube Daily  . rosuvastatin  40 mg Per Tube q1800  . ticagrelor  90 mg Per Tube BID   PRN Medications: acetaminophen, acetaminophen, albuterol, atropine, fentaNYL, hydrALAZINE, midazolam, ondansetron (ZOFRAN) IV, sodium chloride flush, vecuronium Hemodynamic parameters:   Intake/Output: 12/31 0701 - 01/01 0700 In: 1787.4 [I.V.:799.9; NG/GT:687.5; IV HQPRFFMBW:466] Out: 5993 [Urine:1450; Stool:800]  Ventilator  Settings: Vent Mode: PRVC FiO2 (%):  [45 %] 45 % Set Rate:  [18 bmp] 18 bmp Vt Set:  [500 mL-550 mL] 500 mL PEEP:  [5 cmH20-10 cmH20] 10 cmH20 Plateau Pressure:  [17 cmH20-22 cmH20] 20 cmH20   Other Labs:     LAB RESULTS:  Basic Metabolic Panel: Recent Labs  Lab 11/25/19 0418 11/27/19 0403 11/28/19 0345 11/29/19 0359 11/30/19 0428 12/01/19 0359  NA 143 143 144 142 147* 148*  K 3.2* 3.0* 3.2* 4.7 3.6 3.2*  CL 103 106 109 109 107 111  CO2 '25 29 30 30 ' 33* 30  GLUCOSE 150* 99 94 130*  144* 155*  BUN 28* 25* 28* 27* 30* 31*  CREATININE 1.26* 0.89 0.75 0.74 0.87 0.61  CALCIUM 6.6* 7.4* 7.7* 7.7* 8.0* 8.1*  MG 1.8  --  2.3  --   --   --   PHOS  --   --  3.1  --   --   --    Liver Function Tests: Recent Labs  Lab 11/26/19 0422 11/27/19 0403 11/28/19 0345 11/29/19 0359 11/30/19 0428  AST 105* 70* 45* 34 39  ALT 53* 46* 39 36 39  ALKPHOS 36* 37* 36* 37* 39  BILITOT 0.9 0.9 0.7 0.7 0.6  PROT 4.9* 5.1* 4.9* 5.1* 5.7*  ALBUMIN 2.5* 2.4* 2.3* 2.3*  2.5*   No results for input(s): LIPASE, AMYLASE in the last 168 hours. No results for input(s): AMMONIA in the last 168 hours. CBC: Recent Labs  Lab 11/27/19 0403 11/28/19 0345 11/29/19 0359 11/30/19 0428 12/01/19 0359  WBC 12.0* 10.5 9.6 12.0* 13.7*  NEUTROABS 9.8* 6.9 7.7 8.3* 10.2*  HGB 10.5* 10.4* 10.5* 12.0* 11.5*  HCT 31.7* 32.6* 33.1* 35.5* 35.4*  MCV 101.0* 103.5* 104.4* 98.1 100.0  PLT 123* 121* 113* 141* 157   Cardiac Enzymes: No results for input(s): CKTOTAL, CKMB, CKMBINDEX, TROPONINI in the last 168 hours. BNP: Invalid input(s): POCBNP CBG: Recent Labs  Lab 11/30/19 1927 12/01/19 0017 12/01/19 0324 12/01/19 0746 12/01/19 1046  GLUCAP 110* 107* 129* 122* 122*     IMAGING RESULTS:  Imaging: DG Chest Port 1 View  Result Date: 11/30/2019 CLINICAL DATA:  Pulmonary disease. EXAM: PORTABLE CHEST 1 VIEW COMPARISON:  Radiograph yesterday. FINDINGS: Endotracheal tube is 3.3 cm from the carina. Enteric tube tip below the diaphragm not included in the field of view. Right internal jugular central venous catheter tip projects over the upper SVC. Hazy bilateral lung base opacities likely combination of pleural fluid and atelectasis/airspace disease, unchanged. There is vascular congestion. Heart is normal in size with unchanged mediastinal contours allowing for rotation. No pneumothorax. IMPRESSION: 1. Vascular congestion with unchanged hazy lung base opacities, likely combination of pleural effusions  and atelectasis/airspace disease. 2. Stable support apparatus. Electronically Signed   By: Keith Rake M.D.   On: 11/30/2019 03:14      ASSESSMENT AND PLAN      64 yo white male with acute and severe hypoxic respiratory failure from acute V fib cardiac arrest and sudden cardiac deathSTEMI from acute RCA OCCLUSION andfrom COCAINE Toxicity. Now with Cardiogenic shock requiring multiple vasopressors. Concern for possible aspiration pneumonia.   Severe acute hypoxic and hypercapnic respiratory failure SBT today - weaned off of propofol Chest physiotherapy with phelgm production  Continue BD therapy Wean FiO2 and PEEP as tolerated CXR with worsening airspace opacities - possibly due to net + fluid balance , have diuresed 5L fluid off over past 48 hours , FIO2 weaned to 40%    Acute MI and cardiac failure-STEMI and cocaine abuse Follow-up cardiology recommendations Completed hypothermia protocol Antiplatelet agent therapy as per cardiology   Acute kidney injury and renal failure Follow Chem-7 Follow urine output Foley as needed Avoid nephrotoxic agents   Hypoglycemia   - continue D10 gtt   - OG feeds initiated and CBGs imporved - will hold for now due to SBP  Neurology Completed hypothermia protocol Plan for sedation vacation and assess neurological status We will need to consider CT or MRI for further assessment   Cardiac Acute MI Follow cardiology recommendations Continue ICU monitoring  Infectious disease Continue antibiotics for aspiration pneumonia   GI GI PROPHYLAXIS as indicated  NUTRITIONAL STATUS DIET-->TF's as tolerated Constipation protocol as indicated  ELECTROLYTES -follow labs as needed -replace as needed -pharmacy consultation and following   ENDO - will use ICU hypoglycemic\Hyperglycemia protocol if indicated    DVT/GI PRX ordered TRANSFUSIONS AS NEEDED MONITOR FSBS ASSESS the need for LABS as needed  S  as needed   Critical care provider statement:    Critical care time (minutes):  32   Critical care time was exclusive of:  Separately billable procedures and treating other patients   Critical care was necessary to treat or prevent imminent or life-threatening deterioration of the following conditions:   Acute hypoxemic hypercapnic respiratory  failure, cardiac arrest, cocaine abuse, acute kidney injury, multiple comorbid conditions   Critical care was time spent personally by me on the following activities:  Development of treatment plan with patient or surrogate, discussions with consultants, evaluation of patient's response to treatment, examination of patient, obtaining history from patient or surrogate, ordering and performing treatments and interventions, ordering and review of laboratory studies and re-evaluation of patient's condition.  I assumed direction of critical care for this patient from another provider in my specialty: no    This document was prepared using Dragon voice recognition software and may include unintentional dictation errors.    Ottie Glazier, M.D.  Division of [facility]

## 2019-12-02 ENCOUNTER — Encounter: Payer: Self-pay | Admitting: Internal Medicine

## 2019-12-02 LAB — CBC WITH DIFFERENTIAL/PLATELET
Abs Immature Granulocytes: 0.14 10*3/uL — ABNORMAL HIGH (ref 0.00–0.07)
Basophils Absolute: 0 10*3/uL (ref 0.0–0.1)
Basophils Relative: 0 %
Eosinophils Absolute: 0.1 10*3/uL (ref 0.0–0.5)
Eosinophils Relative: 0 %
HCT: 35.7 % — ABNORMAL LOW (ref 39.0–52.0)
Hemoglobin: 11.4 g/dL — ABNORMAL LOW (ref 13.0–17.0)
Immature Granulocytes: 1 %
Lymphocytes Relative: 14 %
Lymphs Abs: 1.9 10*3/uL (ref 0.7–4.0)
MCH: 32.2 pg (ref 26.0–34.0)
MCHC: 31.9 g/dL (ref 30.0–36.0)
MCV: 100.8 fL — ABNORMAL HIGH (ref 80.0–100.0)
Monocytes Absolute: 1.9 10*3/uL — ABNORMAL HIGH (ref 0.1–1.0)
Monocytes Relative: 14 %
Neutro Abs: 9.8 10*3/uL — ABNORMAL HIGH (ref 1.7–7.7)
Neutrophils Relative %: 71 %
Platelets: 181 10*3/uL (ref 150–400)
RBC: 3.54 MIL/uL — ABNORMAL LOW (ref 4.22–5.81)
RDW: 12.9 % (ref 11.5–15.5)
WBC: 13.7 10*3/uL — ABNORMAL HIGH (ref 4.0–10.5)
nRBC: 0 % (ref 0.0–0.2)

## 2019-12-02 LAB — GLUCOSE, CAPILLARY
Glucose-Capillary: 112 mg/dL — ABNORMAL HIGH (ref 70–99)
Glucose-Capillary: 114 mg/dL — ABNORMAL HIGH (ref 70–99)
Glucose-Capillary: 119 mg/dL — ABNORMAL HIGH (ref 70–99)
Glucose-Capillary: 90 mg/dL (ref 70–99)
Glucose-Capillary: 93 mg/dL (ref 70–99)
Glucose-Capillary: 96 mg/dL (ref 70–99)
Glucose-Capillary: 97 mg/dL (ref 70–99)

## 2019-12-02 LAB — BASIC METABOLIC PANEL
Anion gap: 9 (ref 5–15)
BUN: 28 mg/dL — ABNORMAL HIGH (ref 8–23)
CO2: 32 mmol/L (ref 22–32)
Calcium: 8 mg/dL — ABNORMAL LOW (ref 8.9–10.3)
Chloride: 106 mmol/L (ref 98–111)
Creatinine, Ser: 0.74 mg/dL (ref 0.61–1.24)
GFR calc Af Amer: >60 mL/min (ref >60)
GFR calc non Af Amer: >60 mL/min (ref >60)
Glucose, Bld: 109 mg/dL — ABNORMAL HIGH (ref 70–99)
Potassium: 2.9 mmol/L — ABNORMAL LOW (ref 3.5–5.1)
Sodium: 147 mmol/L — ABNORMAL HIGH (ref 135–145)

## 2019-12-02 MED ORDER — THIAMINE HCL 100 MG PO TABS
100.0000 mg | ORAL_TABLET | Freq: Every day | ORAL | Status: DC
  Administered 2019-12-05 – 2019-12-11 (×7): 100 mg via ORAL
  Filled 2019-12-02 (×8): qty 1

## 2019-12-02 MED ORDER — FOLIC ACID 5 MG/ML IJ SOLN
1.0000 mg | Freq: Every day | INTRAMUSCULAR | Status: DC
  Administered 2019-12-03 – 2019-12-04 (×2): 1 mg via INTRAVENOUS
  Filled 2019-12-02 (×5): qty 0.2

## 2019-12-02 MED ORDER — LORAZEPAM 1 MG PO TABS: 1.0000 mg | ORAL_TABLET | ORAL | Status: AC | PRN

## 2019-12-02 MED ORDER — POTASSIUM CHLORIDE 10 MEQ/50ML IV SOLN
10.0000 meq | INTRAVENOUS | Status: AC
  Administered 2019-12-02 (×4): 10 meq via INTRAVENOUS
  Filled 2019-12-02 (×4): qty 50

## 2019-12-02 MED ORDER — LORAZEPAM 2 MG/ML IJ SOLN
INTRAMUSCULAR | Status: AC
  Filled 2019-12-02: qty 2

## 2019-12-02 MED ORDER — DEXTROSE 50 % IV SOLN
12.5000 g | Freq: Once | INTRAVENOUS | Status: AC
  Administered 2019-12-02: 04:00:00 12.5 g via INTRAVENOUS
  Filled 2019-12-02: qty 50

## 2019-12-02 MED ORDER — LORAZEPAM 2 MG/ML IJ SOLN
1.0000 mg | INTRAMUSCULAR | Status: AC | PRN
  Administered 2019-12-02: 4 mg via INTRAVENOUS

## 2019-12-02 MED ORDER — THIAMINE HCL 100 MG/ML IJ SOLN
100.0000 mg | Freq: Every day | INTRAMUSCULAR | Status: DC
  Administered 2019-12-02 – 2019-12-04 (×2): 100 mg via INTRAVENOUS
  Filled 2019-12-02 (×3): qty 2

## 2019-12-02 NOTE — Progress Notes (Signed)
Pt has remained alert and oriented to self with no c/o pain. Pt transitioned off bipap to [facility], lung sounds are diminished to auscultation, SpO2 > 90%. Pt passed swallow screen and has tolerated ice chips well. Pt has remained in NSR on cardiac monitor, HR/BP WNL. Son visited and updated at bedside. Precedex has been titrated down to 0.4- Pt is calm and cooperative.

## 2019-12-02 NOTE — Progress Notes (Signed)
CRITICAL CARE PROGRESS NOTE    Name: Ramiz Turpin MRN: [id number] DOB: March 01, 1956     LOS: 9   SUBJECTIVE FINDINGS & SIGNIFICANT EVENTS   Patient description:   Pt with hx of polysubstance abuse,   Lines / Drains: PIVx2  Cultures / Sepsis markers: n/a  Antibiotics: zosyn   Protocols / Consultants: neurology  Tests / Events: EEG  Overnight: Aggitation with weaning of sedatives. Will initiate nourishment today via OGT   12/24-intubated/Vent support, cardiac arrest, s/p CATH-100% OCCLUSION OF RCA, COCAINE TOXICITY 12/24-HYPOTHERMIA PROTOCOL 12/24 - Critically ill, requiring 3 vasopressors, 100% FiO2 & 11PEEP 12/25 remains on pressors, on vent, fio2 100% 12/26 remains on hypothermia protocol 12/27 completed hypothermia protocol 12/28- remains with high fiO2 requirement and difficulty to wean 12/29 - recruitment maneuvers done, remains hypoxemic poorly responsive with weaning of sedation.  Discussed with son at bedside today.  12/30- patient remains with episodes of hypoglycemia. Plan is to diurese today as well as control CBG and BP better with SBP after 12/31 - post aggressive diuresis overnight, SBT today with plan for liberation from MV if pass.  1/1 - patient is more awake , he will have another weaning SBT trial today  1/2 -patient was successfully extubated to BiPAP, required Precedex overnight due to residual agitation/withdrawal secondary to polysubstance abuse   PAST MEDICAL HISTORY   Past Medical History:  Diagnosis Date  . COPD (chronic obstructive pulmonary disease) ([place])   . ED (erectile dysfunction)   . Hypertension   . Lumbar disc disease      SURGICAL HISTORY   Past Surgical History:  Procedure Laterality Date  . APPENDECTOMY    . CORONARY STENT INTERVENTION  N/A 11/23/2019   Procedure: CORONARY STENT INTERVENTION;  Surgeon: Yolonda Kida, MD;  Location: [place] [facility];  Service: Cardiovascular;  Laterality: N/A;  RCA  . CORONARY/GRAFT ACUTE MI REVASCULARIZATION N/A 11/23/2019   Procedure: Coronary/Graft Acute MI Revascularization;  Surgeon: Yolonda Kida, MD;  Location: [place] [facility];  Service: Cardiovascular;  Laterality: N/A;  . LEFT HEART CATH AND CORONARY ANGIOGRAPHY N/A 11/23/2019   Procedure: LEFT HEART CATH AND CORONARY ANGIOGRAPHY;  Surgeon: Yolonda Kida, MD;  Location: [place] [facility];  Service: Cardiovascular;  Laterality: N/A;     FAMILY HISTORY   Family History  Problem Relation Age of Onset  . Hyperlipidemia Father   . Cancer Father   . Heart disease Father        heart valve disease (from Agent Orange?)  . Alcohol abuse Mother   . Hypertension Neg Hx   . Diabetes Neg Hx   . Colon cancer Neg Hx   . Colon polyps Neg Hx   . Pancreatic cancer Neg Hx   . Rectal cancer Neg Hx   . Stomach cancer Neg Hx      SOCIAL HISTORY   Social History   Tobacco Use  . Smoking status: Current Every Day Smoker  . Smokeless tobacco: Never Used  Substance Use Topics  . Alcohol use: Yes  . Drug use: Yes    Types: Marijuana     MEDICATIONS   Current Medication:  Current Facility-Administered Medications:  .  0.9 %  sodium chloride infusion, , Intravenous, Continuous, Funke, Mary E, MD .  0.9 %  sodium chloride infusion, , Intravenous, Continuous, Kasa, Kurian, MD .  acetaminophen (TYLENOL) tablet 650 mg, 650 mg, Oral, Q4H PRN, Flora Lipps, MD .  acetaminophen (TYLENOL) tablet 650 mg, 650 mg,  Oral, Q4H PRN, Callwood, Dwayne D, MD .  albuterol (PROVENTIL) (2.5 MG/3ML) 0.083% nebulizer solution 2.5 mg, 2.5 mg, Nebulization, Q2H PRN, Kasa, Kurian, MD .  aspirin chewable tablet 81 mg, 81 mg, Per Tube, Daily, Benita Gutter, RPH, 81 mg at 12/01/19 1001 .  atropine 1 MG/10ML injection 1 mg, 1 mg,  Intravenous, PRN, Awilda Bill, NP .  cefTRIAXone (ROCEPHIN) 2 g in sodium chloride 0.9 % 100 mL IVPB, 2 g, Intravenous, Q12H, Alisha Bacus, MD, Last Rate: 200 mL/hr at 12/01/19 2200, 2 g at 12/01/19 2200 .  chlorhexidine gluconate (MEDLINE KIT) (PERIDEX) 0.12 % solution 15 mL, 15 mL, Mouth Rinse, BID, [name] D, NP, 15 mL at 12/01/19 0723 .  Chlorhexidine Gluconate Cloth 2 % PADS 6 each, 6 each, Topical, Daily, Ottie Glazier, MD, 6 each at 12/01/19 2129 .  dexmedetomidine (PRECEDEX) 400 MCG/100ML (4 mcg/mL) infusion, 0.4-1.2 mcg/kg/hr, Intravenous, Titrated, Kimberlyann Hollar, MD, Last Rate: 28.8 mL/hr at 12/02/19 0815, 1.2 mcg/kg/hr at 12/02/19 0815 .  dextrose 50 % solution 50 mL, 1 ampule, Intravenous, Once, Blakeney, Dreama Saa, NP .  enoxaparin (LOVENOX) injection 40 mg, 40 mg, Subcutaneous, Q24H, Lanney Gins, Esias Mory, MD, 40 mg at 12/01/19 1001 .  famotidine (PEPCID) IVPB 20 mg premix, 20 mg, Intravenous, Q12H, Kasa, Kurian, MD, Last Rate: 100 mL/hr at 12/01/19 2128, 20 mg at 12/01/19 2128 .  feeding supplement (PIVOT 1.5 CAL) liquid 1,000 mL, 1,000 mL, Per Tube, Continuous, Ottie Glazier, MD, Stopped at 12/01/19 1338 .  furosemide (LASIX) injection 40 mg, 40 mg, Intravenous, Q12H, Ottie Glazier, MD, 40 mg at 12/02/19 0007 .  glycopyrrolate (ROBINUL) injection 0.2 mg, 0.2 mg, Intravenous, Q4H PRN, Lanney Gins, Verbena Boeding, MD, 0.2 mg at 12/01/19 1320 .  hydrALAZINE (APRESOLINE) injection 10 mg, 10 mg, Intravenous, Q6H PRN, [name] D, NP, 10 mg at 11/30/19 1256 .  ipratropium-albuterol (DUONEB) 0.5-2.5 (3) MG/3ML nebulizer solution 3 mL, 3 mL, Nebulization, Q4H, Kasa, Kurian, MD, 3 mL at 12/02/19 0752 .  methylPREDNISolone sodium succinate (SOLU-MEDROL) 40 mg/mL injection 40 mg, 40 mg, Intravenous, Q24H, Elvis Laufer, MD, 40 mg at 12/01/19 1001 .  multivitamin liquid 15 mL, 15 mL, Per Tube, Daily, Lanney Gins, Maryfer Tauzin, MD, 15 mL at 12/01/19 1001 .  ondansetron (ZOFRAN) injection 4 mg, 4  mg, Intravenous, Q6H PRN, Callwood, Dwayne D, MD .  potassium chloride 10 mEq in 50 mL *CENTRAL LINE* IVPB, 10 mEq, Intravenous, Q1 Hr x 4, Kamyiah Colantonio, MD .  rosuvastatin (CRESTOR) tablet 40 mg, 40 mg, Per Tube, q1800, Ottie Glazier, MD, 40 mg at 11/30/19 1701 .  sodium chloride flush (NS) 0.9 % injection 3 mL, 3 mL, Intravenous, PRN, Callwood, Dwayne D, MD, 3 mL at 11/30/19 0931 .  ticagrelor (BRILINTA) tablet 90 mg, 90 mg, Per Tube, BID, Benita Gutter, RPH, 90 mg at 12/01/19 1001    ALLERGIES   Patient has no known allergies.    REVIEW OF SYSTEMS     Unable to obtain due to sedation   PHYSICAL EXAMINATION   Vital Signs: Temp:  [96.4 F (35.8 C)-100.9 F (38.3 C)] 99.1 F (37.3 C) (01/02 0800) Pulse Rate:  [60-103] 69 (01/02 0800) Resp:  [14-30] 17 (01/02 0800) BP: (111-167)/(75-101) 114/84 (01/02 0800) SpO2:  [93 %-100 %] 96 % (01/02 0800) FiO2 (%):  [35 %-45 %] 35 % (01/02 0752) Weight:  [86.4 kg] 86.4 kg (01/02 0500)  GENERAL:chronically ill appearing HEAD: Normocephalic, atraumatic.  EYES: Pupils equal, round, reactive to light.  No scleral icterus.  MOUTH: Moist mucosal membrane. NECK: Supple. No thyromegaly. No nodules. No JVD.  PULMONARY: Bibasilar crackles  CARDIOVASCULAR: S1 and S2. Regular rate and rhythm. No murmurs, rubs, or gallops.  GASTROINTESTINAL: Soft, nontender, non-distended. No masses. Positive bowel sounds. No hepatosplenomegaly.  MUSCULOSKELETAL: No swelling, clubbing, or edema.  NEUROLOGIC: Mild distress due to acute illness SKIN:intact,warm,dry   PERTINENT DATA     Infusions: . sodium chloride    . sodium chloride    . cefTRIAXone (ROCEPHIN)  IV 2 g (12/01/19 2200)  . dexmedetomidine (PRECEDEX) IV infusion 1.2 mcg/kg/hr (12/02/19 0815)  . famotidine (PEPCID) IV 20 mg (12/01/19 2128)  . feeding supplement (PIVOT 1.5 CAL) Stopped (12/01/19 1338)  . potassium chloride     Scheduled Medications: . aspirin  81 mg Per Tube  Daily  . chlorhexidine gluconate (MEDLINE KIT)  15 mL Mouth Rinse BID  . Chlorhexidine Gluconate Cloth  6 each Topical Daily  . dextrose  1 ampule Intravenous Once  . enoxaparin (LOVENOX) injection  40 mg Subcutaneous Q24H  . furosemide  40 mg Intravenous Q12H  . ipratropium-albuterol  3 mL Nebulization Q4H  . methylPREDNISolone (SOLU-MEDROL) injection  40 mg Intravenous Q24H  . multivitamin  15 mL Per Tube Daily  . rosuvastatin  40 mg Per Tube q1800  . ticagrelor  90 mg Per Tube BID   PRN Medications: acetaminophen, acetaminophen, albuterol, atropine, glycopyrrolate, hydrALAZINE, ondansetron (ZOFRAN) IV, sodium chloride flush Hemodynamic parameters:   Intake/Output: 01/01 0701 - 01/02 0700 In: 1164.5 [I.V.:679.5; NG/GT:335; IV Piggyback:150] Out: 5093 [OIZTI:4580; Stool:100]  Ventilator  Settings: Vent Mode: Spontaneous FiO2 (%):  [35 %-45 %] 35 % Set Rate:  [18 bmp] 18 bmp Vt Set:  [550 mL] 550 mL PEEP:  [5 cmH20-10 cmH20] 5 cmH20 Pressure Support:  [5 cmH20] 5 cmH20 Plateau Pressure:  [19 cmH20] 19 cmH20   Other Labs:     LAB RESULTS:  Basic Metabolic Panel: Recent Labs  Lab 11/28/19 0345 11/29/19 0359 11/30/19 0428 12/01/19 0359 12/02/19 0552  NA 144 142 147* 148* 147*  K 3.2* 4.7 3.6 3.2* 2.9*  CL 109 109 107 111 106  CO2 30 30 33* 30 32  GLUCOSE 94 130* 144* 155* 109*  BUN 28* 27* 30* 31* 28*  CREATININE 0.75 0.74 0.87 0.61 0.74  CALCIUM 7.7* 7.7* 8.0* 8.1* 8.0*  MG 2.3  --   --   --   --   PHOS 3.1  --   --   --   --    Liver Function Tests: Recent Labs  Lab 11/26/19 0422 11/27/19 0403 11/28/19 0345 11/29/19 0359 11/30/19 0428  AST 105* 70* 45* 34 39  ALT 53* 46* 39 36 39  ALKPHOS 36* 37* 36* 37* 39  BILITOT 0.9 0.9 0.7 0.7 0.6  PROT 4.9* 5.1* 4.9* 5.1* 5.7*  ALBUMIN 2.5* 2.4* 2.3* 2.3* 2.5*   No results for input(s): LIPASE, AMYLASE in the last 168 hours. No results for input(s): AMMONIA in the last 168 hours. CBC: Recent Labs  Lab  11/28/19 0345 11/29/19 0359 11/30/19 0428 12/01/19 0359 12/02/19 0552  WBC 10.5 9.6 12.0* 13.7* 13.7*  NEUTROABS 6.9 7.7 8.3* 10.2* 9.8*  HGB 10.4* 10.5* 12.0* 11.5* 11.4*  HCT 32.6* 33.1* 35.5* 35.4* 35.7*  MCV 103.5* 104.4* 98.1 100.0 100.8*  PLT 121* 113* 141* 157 181   Cardiac Enzymes: No results for input(s): CKTOTAL, CKMB, CKMBINDEX, TROPONINI in the last 168 hours. BNP: Invalid input(s): POCBNP CBG: Recent Labs  Lab 12/01/19 1547 12/01/19  1929 12/02/19 0010 12/02/19 0335 12/02/19 0755  GLUCAP 122* 101* 90 93 96     IMAGING RESULTS:  Imaging: No results found.      ASSESSMENT AND PLAN      64 yo white male with acute and severe hypoxic respiratory failure from acute V fib cardiac arrest and sudden cardiac deathSTEMI from acute RCA OCCLUSION andfrom COCAINE Toxicity. Now with Cardiogenic shock requiring multiple vasopressors. Concern for possible aspiration pneumonia.   Severe acute hypoxic and hypercapnic respiratory failure Liberated off mechanical ventilation 1/1 Chest physiotherapy with phelgm production  Continue BD therapy Wean FiO2 and PEEP as tolerated CXR with worsening airspace opacities - partially due to pulmonary edema, this has improved quite a bit after diuresis and we will continue to diurese     Acute MI and cardiac failure-STEMI and cocaine abuse Follow-up cardiology recommendations Completed hypothermia protocol Antiplatelet agent therapy as per cardiology   Acute kidney injury and renal failure-resolved Follow Chem-7 Follow urine output Foley as needed Avoid nephrotoxic agents   Hypoglycemia   - continue D10 gtt   - OG feeds initiated and CBGs imporved - will hold for now due to SBP  Neurology Completed hypothermia protocol Plan for sedation vacation and assess neurological status We will need to consider CT or MRI for further assessment   Cardiac Acute MI Follow cardiology recommendations Continue  ICU monitoring  Infectious disease Continue antibiotics for aspiration pneumonia   GI GI PROPHYLAXIS as indicated  NUTRITIONAL STATUS DIET-->TF's as tolerated Constipation protocol as indicated  ELECTROLYTES -follow labs as needed -replace as needed -pharmacy consultation and following   ENDO - will use ICU hypoglycemic\Hyperglycemia protocol if indicated    DVT/GI PRX ordered TRANSFUSIONS AS NEEDED MONITOR FSBS ASSESS the need for LABS as needed  S as needed   Critical care provider statement:    Critical care time (minutes):  32   Critical care time was exclusive of:  Separately billable procedures and treating other patients   Critical care was necessary to treat or prevent imminent or life-threatening deterioration of the following conditions:   Acute hypoxemic hypercapnic respiratory failure, cardiac arrest, cocaine abuse, acute kidney injury, multiple comorbid conditions   Critical care was time spent personally by me on the following activities:  Development of treatment plan with patient or surrogate, discussions with consultants, evaluation of patient's response to treatment, examination of patient, obtaining history from patient or surrogate, ordering and performing treatments and interventions, ordering and review of laboratory studies and re-evaluation of patient's condition.  I assumed direction of critical care for this patient from another provider in my specialty: no    This document was prepared using Dragon voice recognition software and may include unintentional dictation errors.    Ottie Glazier, M.D.  Division of [place]

## 2019-12-02 NOTE — Progress Notes (Signed)
Central telemetry reported that patient had some idoventricular beats, but otherwise occasional PVCs. Will pass on to dayshift RN and continue to monitor.

## 2019-12-02 NOTE — Progress Notes (Signed)
[facility]  SUBJECTIVE: Extubated, on BiPAP, nonconversant   Vitals:   12/02/19 0500 12/02/19 0600 12/02/19 0700 12/02/19 0800  BP: 111/81 121/80 118/75 114/84  Pulse: 67 72 60 69  Resp: (!) 21 (!) 22 (!) 21 17  Temp: 99.7 F (37.6 C) 99.5 F (37.5 C) 99.5 F (37.5 C) 99.1 F (37.3 C)  TempSrc:   Bladder Bladder  SpO2: 96% 97% 97% 96%  Weight: 86.4 kg     Height:         Intake/Output Summary (Last 24 hours) at 12/02/2019 0853 Last data filed at 12/02/2019 0700 Gross per 24 hour  Intake 1071.66 ml  Output 7050 ml  Net -5978.34 ml      PHYSICAL EXAM  General: On BiPAP, wearing mittens HEENT:  Normocephalic and atramatic Neck:  No JVD.  Lungs: Clear bilaterally to auscultation and percussion. Heart: HRRR . Normal S1 and S2 without gallops or murmurs.  Abdomen: Bowel sounds are positive, abdomen soft and non-tender  Msk:  Back normal, normal gait. Normal strength and tone for age. Extremities: No clubbing, cyanosis or edema.   Neuro: Nonconversant Psych: Nonconversant   LABS: Basic Metabolic Panel: Recent Labs    12/01/19 0359 12/02/19 0552  NA 148* 147*  K 3.2* 2.9*  CL 111 106  CO2 30 32  GLUCOSE 155* 109*  BUN 31* 28*  CREATININE 0.61 0.74  CALCIUM 8.1* 8.0*   Liver Function Tests: Recent Labs    11/30/19 0428  AST 39  ALT 39  ALKPHOS 39  BILITOT 0.6  PROT 5.7*  ALBUMIN 2.5*   No results for input(s): LIPASE, AMYLASE in the last 72 hours. CBC: Recent Labs    12/01/19 0359 12/02/19 0552  WBC 13.7* 13.7*  NEUTROABS 10.2* 9.8*  HGB 11.5* 11.4*  HCT 35.4* 35.7*  MCV 100.0 100.8*  PLT 157 181   Cardiac Enzymes: No results for input(s): CKTOTAL, CKMB, CKMBINDEX, TROPONINI in the last 72 hours. BNP: Invalid input(s): POCBNP D-Dimer: No results for input(s): DDIMER in the last 72 hours. Hemoglobin A1C: No results for input(s): HGBA1C in the last 72 hours. Fasting Lipid Panel: Recent Labs    11/30/19 0428  TRIG 100   Thyroid  Function Tests: No results for input(s): TSH, T4TOTAL, T3FREE, THYROIDAB in the last 72 hours.  Invalid input(s): FREET3 Anemia Panel: No results for input(s): VITAMINB12, FOLATE, FERRITIN, TIBC, IRON, RETICCTPCT in the last 72 hours.  No results found.   Echo LVEF 50 to 55%  TELEMETRY: Sinus rhythm:  ASSESSMENT AND PLAN:  Active Problems:   Cardiac arrest [facility])   STEMI involving right coronary artery ([place])    1.  Inferior STEMI, status post primary PCI with DES RCA, preserved left ventricular function 2.  Cardiac arrest, hypothermia protocol 3.  Respiratory failure, multifactorial, extubated 4.  Polysubstance abuse, with recent cocaine use 5.  Probable aspiration pneumonia/sepsis, on broad-spectrum antibiotics 6.  Multisystem organ failure, status post cardiac arrest  Recommendations  1.  Agree with overall current therapy 2.  Dual antiplatelet therapy uninterrupted for 1 year 3.  No further cardiac diagnostics at this time   Isaias Cowman, MD, PhD, [facility] 12/02/2019 8:53 AM

## 2019-12-03 DIAGNOSIS — F1410 Cocaine abuse, uncomplicated: Secondary | ICD-10-CM

## 2019-12-03 LAB — CBC WITH DIFFERENTIAL/PLATELET
Abs Immature Granulocytes: 0.11 10*3/uL — ABNORMAL HIGH (ref 0.00–0.07)
Basophils Absolute: 0 10*3/uL (ref 0.0–0.1)
Basophils Relative: 0 %
Eosinophils Absolute: 0 10*3/uL (ref 0.0–0.5)
Eosinophils Relative: 0 %
HCT: 35.5 % — ABNORMAL LOW (ref 39.0–52.0)
Hemoglobin: 11.3 g/dL — ABNORMAL LOW (ref 13.0–17.0)
Immature Granulocytes: 1 %
Lymphocytes Relative: 16 %
Lymphs Abs: 2.1 10*3/uL (ref 0.7–4.0)
MCH: 32.4 pg (ref 26.0–34.0)
MCHC: 31.8 g/dL (ref 30.0–36.0)
MCV: 101.7 fL — ABNORMAL HIGH (ref 80.0–100.0)
Monocytes Absolute: 1.7 10*3/uL — ABNORMAL HIGH (ref 0.1–1.0)
Monocytes Relative: 13 %
Neutro Abs: 8.8 10*3/uL — ABNORMAL HIGH (ref 1.7–7.7)
Neutrophils Relative %: 70 %
Platelets: 197 10*3/uL (ref 150–400)
RBC: 3.49 MIL/uL — ABNORMAL LOW (ref 4.22–5.81)
RDW: 12.7 % (ref 11.5–15.5)
WBC: 12.7 10*3/uL — ABNORMAL HIGH (ref 4.0–10.5)
nRBC: 0 % (ref 0.0–0.2)

## 2019-12-03 LAB — CKMB (ARMC ONLY): CK, MB: 2.6 ng/mL (ref 0.5–5.0)

## 2019-12-03 LAB — GLUCOSE, CAPILLARY
Glucose-Capillary: 100 mg/dL — ABNORMAL HIGH (ref 70–99)
Glucose-Capillary: 108 mg/dL — ABNORMAL HIGH (ref 70–99)
Glucose-Capillary: 69 mg/dL — ABNORMAL LOW (ref 70–99)
Glucose-Capillary: 77 mg/dL (ref 70–99)
Glucose-Capillary: 80 mg/dL (ref 70–99)
Glucose-Capillary: 80 mg/dL (ref 70–99)
Glucose-Capillary: 87 mg/dL (ref 70–99)

## 2019-12-03 LAB — BASIC METABOLIC PANEL
Anion gap: 8 (ref 5–15)
BUN: 28 mg/dL — ABNORMAL HIGH (ref 8–23)
CO2: 28 mmol/L (ref 22–32)
Calcium: 8.2 mg/dL — ABNORMAL LOW (ref 8.9–10.3)
Chloride: 111 mmol/L (ref 98–111)
Creatinine, Ser: 0.7 mg/dL (ref 0.61–1.24)
GFR calc Af Amer: >60 mL/min (ref >60)
GFR calc non Af Amer: >60 mL/min (ref >60)
Glucose, Bld: 97 mg/dL (ref 70–99)
Potassium: 3.4 mmol/L — ABNORMAL LOW (ref 3.5–5.1)
Sodium: 147 mmol/L — ABNORMAL HIGH (ref 135–145)

## 2019-12-03 LAB — TROPONIN I (HIGH SENSITIVITY): Troponin I (High Sensitivity): 124 ng/L (ref <18)

## 2019-12-03 LAB — PHOSPHORUS: Phosphorus: 3.1 mg/dL (ref 2.5–4.6)

## 2019-12-03 LAB — MAGNESIUM: Magnesium: 2.1 mg/dL (ref 1.7–2.4)

## 2019-12-03 MED ORDER — METOPROLOL TARTRATE 25 MG PO TABS: 25.0000 mg | ORAL_TABLET | Freq: Two times a day (BID) | ORAL | Status: DC

## 2019-12-03 MED ORDER — ROSUVASTATIN CALCIUM 20 MG PO TABS
40.0000 mg | ORAL_TABLET | Freq: Every day | ORAL | Status: DC
  Administered 2019-12-03 – 2019-12-10 (×7): 40 mg via ORAL
  Filled 2019-12-03: qty 2
  Filled 2019-12-03: qty 4
  Filled 2019-12-03: qty 2
  Filled 2019-12-03 (×2): qty 4
  Filled 2019-12-03 (×7): qty 2
  Filled 2019-12-03: qty 4

## 2019-12-03 MED ORDER — DEXTROSE 50 % IV SOLN
12.5000 g | Freq: Once | INTRAVENOUS | Status: AC
  Administered 2019-12-03: 12.5 g via INTRAVENOUS

## 2019-12-03 MED ORDER — METOPROLOL TARTRATE 50 MG PO TABS
50.0000 mg | ORAL_TABLET | Freq: Two times a day (BID) | ORAL | Status: DC
  Administered 2019-12-03 – 2019-12-11 (×15): 50 mg via ORAL
  Filled 2019-12-03 (×16): qty 1

## 2019-12-03 MED ORDER — POTASSIUM CHLORIDE 10 MEQ/50ML IV SOLN
10.0000 meq | INTRAVENOUS | Status: AC
  Administered 2019-12-03 (×2): 10 meq via INTRAVENOUS
  Filled 2019-12-03 (×2): qty 50

## 2019-12-03 MED ORDER — DEXTROSE 50 % IV SOLN
INTRAVENOUS | Status: AC
  Filled 2019-12-03: qty 50

## 2019-12-03 MED ORDER — ASPIRIN 81 MG PO CHEW
81.0000 mg | CHEWABLE_TABLET | Freq: Every day | ORAL | Status: DC
  Administered 2019-12-04 – 2019-12-11 (×8): 81 mg via ORAL
  Filled 2019-12-03 (×8): qty 1

## 2019-12-03 MED ORDER — TICAGRELOR 90 MG PO TABS
90.0000 mg | ORAL_TABLET | Freq: Two times a day (BID) | ORAL | Status: DC
  Administered 2019-12-03 – 2019-12-11 (×16): 90 mg via ORAL
  Filled 2019-12-03 (×15): qty 1

## 2019-12-03 MED ORDER — POTASSIUM CHLORIDE CRYS ER 20 MEQ PO TBCR: 20.0000 meq | EXTENDED_RELEASE_TABLET | ORAL | Status: DC

## 2019-12-03 NOTE — Evaluation (Signed)
Occupational Therapy Evaluation Patient Details Name: Kurt [name] MRN: [id number] DOB: [birth year] Today's Date: 12/03/2019    History of Present Illness Pt is 64 y/o M with PMH COPD, HTN, and ED who was found down immediately with lit cigarette with EMS called. Pt adm with acute hypoxic respiratory failure from acute Vfib, cardiac arrest-STEMI, from acute RCA occlusion and cocaine toxicity, requiring vasopressors.   Clinical Impression   Pt seen for OT evaluation this date. Prior to hospital admission, pt was indep with self care and fxl mobility without AD.  Pt lives in [facility] with 4 STE with one of his sons living next door.  Currently pt demonstrates impairments in sitting and standing balance and tolerance as well as significantly decreased strength of trunk and limbs requiring MAX A for bed mobility, MOD A to maintain static sitting balance, MIN/MOD A with seated UB ADLs and total A with LB ADLs at this time d/t Poor dynamic sitting balance.  Unable to attempt stand this date for safety as pt unable to maintain static sitting balance without constant assistance and with decreased UE strength/ROM limiting pt's ability to participate in push or pull to stand. Pt will likely need 2p assist to attempt fxl transfers on future therapy sessions for safety. Pt would benefit from skilled OT to address noted impairments and functional limitations (see below for any additional details) in order to maximize safety and independence while minimizing falls risk and caregiver burden.  Upon hospital discharge, recommend pt discharge to SNF.    Follow Up Recommendations  SNF    Equipment Recommendations  Other (comment)(defer to next venue of care.)    Recommendations for Other Services PT consult(asked RN for PT order as well)     Precautions / Restrictions Precautions Precautions: Fall Restrictions Weight Bearing Restrictions: No Other Position/Activity Restrictions: monitor HR and O2      Mobility  Bed Mobility Overal bed mobility: Needs Assistance Bed Mobility: Supine to Sit;Sit to Supine     Supine to sit: Max assist Sit to supine: Max assist      Transfers                 General transfer comment: unable to attempt t/f at this time, pt unable to maintain static sitting balance without MOD A entire time. Anticipate 2p assist.    Balance Overall balance assessment: Needs assistance Sitting-balance support: Bilateral upper extremity supported;Feet supported Sitting balance-Leahy Scale: Poor Sitting balance - Comments: Requires MOD A to maintain static sit                                   ADL either performed or assessed with clinical judgement   ADL Overall ADL's : Needs assistance/impaired Eating/Feeding: Minimal assistance;Moderate assistance;Sitting Eating/Feeding Details (indicate cue type and reason): requires assist to maintain balance as well as perform hand to mouth, decreased UE strength Grooming: Wash/dry hands;Wash/dry face;Oral care;Minimal assistance;Moderate assistance;Sitting               Lower Body Dressing: Maximal assistance;Sitting/lateral leans     Toilet Transfer Details (indicate cue type and reason): unable to attempt for pt safety, pt unable to maintain static sitting balance without assist at all times, anticipate 2p assist beneficial Toileting- Clothing Manipulation and Hygiene: Total assistance Toileting - Clothing Manipulation Details (indicate cue type and reason): with currently with catheter and FMS  Vision Patient Visual Report: No change from baseline Additional Comments: difficult to formally assess.     Perception     Praxis      Pertinent Vitals/Pain Pain Assessment: No/denies pain     Hand Dominance Right   Extremity/Trunk Assessment Upper Extremity Assessment Upper Extremity Assessment: Generalized weakness;RUE deficits/detail;LUE deficits/detail RUE Deficits / Details: shld  3-/5, elbow3-/5, grip 3+/5 LUE Deficits / Details: shld 3-/5, elbow 3-/5, grip 4-/5   Lower Extremity Assessment Lower Extremity Assessment: Defer to PT evaluation;Generalized weakness       Communication Communication Communication: No difficulties   Cognition Arousal/Alertness: Awake/alert Behavior During Therapy: WFL for tasks assessed/performed Overall Cognitive Status: Impaired/Different from baseline Area of Impairment: Orientation;Attention;Memory;Following commands;Safety/judgement;Awareness                 Orientation Level: Disoriented to;Time;Situation Current Attention Level: Focused Memory: Decreased short-term memory Following Commands: Follows one step commands with increased time;Follows multi-step commands inconsistently Safety/Judgement: Decreased awareness of safety;Decreased awareness of deficits         General Comments       Exercises Other Exercises Other Exercises: OT facilitates education re: role of OT in acute setting as well as d/c recommendations. Pt and son present in room verbalize understanding. Pt needs reinforcement-MIN/MOD reception of education detected. Other Exercises: OT facilitates pt participation in grasp exercise x5 reps bilaterally with MOD verbal cues required for form, pace and technique. Son verbalized understanding. Pt needs reinforcement. Other Exercises: OT facilitates pt participation in straight arm raises x5 bilaterally with MOD verbal/visual cues for form and pace. Son verbalized understanding, to encourage exercise outside of therapy time.   Shoulder Instructions      Home Living Family/patient expects to be discharged to:: Private residence Living Arrangements: Alone Available Help at Discharge: Family;Available 24 hours/day(son lives next door) Type of Home: House Home Access: Stairs to enter [employer] of Steps: 4 Entrance Stairs-Rails: Right;Left;Can reach both Home Layout: One level      Bathroom Shower/Tub: Teacher, early years/pre: [place]: Environmental consultant - 4 wheels;Tub bench(all AD was his wife's-passed away in September 08, 2023)          Prior Functioning/Environment Level of Independence: Independent        Comments: Pt reports driving, no AD for fxl mobility within home or community. Pt is somewhat questionable historian-some confusion/increased processing time. But one son is present throughout assessment and confirms pt was Indep at baseline.        OT Problem List: Decreased strength;Decreased range of motion;Decreased activity tolerance;Impaired balance (sitting and/or standing);Decreased coordination;Decreased cognition;Decreased safety awareness;Decreased knowledge of use of DME or AE;Decreased knowledge of precautions;Cardiopulmonary status limiting activity      OT Treatment/Interventions: Self-care/ADL training;Therapeutic exercise;Energy conservation;DME and/or AE instruction;Therapeutic activities;Patient/family education;Balance training    OT Goals(Current goals can be found in the care plan section) Acute Rehab OT Goals Patient Stated Goal: to get out of the hospital OT Goal Formulation: With patient/family Time For Goal Achievement: 12/17/19 Potential to Achieve Goals: Good  OT Frequency: Min 2X/week   Barriers to D/C:            Co-evaluation              AM-PAC OT "6 Clicks" Daily Activity     Outcome Measure Help from another person eating meals?: A Little Help from another person taking care of personal grooming?: A Little Help from another person toileting, which includes using toliet, bedpan, or urinal?: Total  Help from another person bathing (including washing, rinsing, drying)?: A Lot Help from another person to put on and taking off regular upper body clothing?: A Lot Help from another person to put on and taking off regular lower body clothing?: Total 6 Click Score: 12   End of Session Nurse Communication:  Mobility status  Activity Tolerance: Patient tolerated treatment well Patient left: in bed;with call bell/phone within reach;with bed alarm set;with family/visitor present(son present throughout and at end of session)  OT Visit Diagnosis: Unsteadiness on feet (R26.81);Muscle weakness (generalized) (M62.81)                Time: [id number] OT Time Calculation (min): 54 min Charges:  OT General Charges $OT Visit: 1 Visit OT Evaluation $OT Eval Moderate Complexity: 1 Mod OT Treatments $Self Care/Home Management : 23-37 mins $Therapeutic Activity: 8-22 mins  Gerrianne Scale, MS, OTR/L ascom [phone] 12/03/19, 5:44 PM

## 2019-12-03 NOTE — Progress Notes (Signed)
[facility] Cardiology  SUBJECTIVE: Extubated, nonconversant   Vitals:   12/03/19 0000 12/03/19 0300 12/03/19 0400 12/03/19 0500  BP: 121/77 (!) 150/84 140/84   Pulse: 62 (!) 58 63 (!) 52  Resp: 15 13 15 19   Temp: 99.3 F (37.4 C) 98.4 F (36.9 C) 98.1 F (36.7 C) 97.7 F (36.5 C)  TempSrc:      SpO2: 91% 95% 96% 96%  Weight:    84.8 kg  Height:         Intake/Output Summary (Last 24 hours) at 12/03/2019 0934 Last data filed at 12/03/2019 0530 Gross per 24 hour  Intake 983.43 ml  Output 1650 ml  Net -666.57 ml      PHYSICAL EXAM  General: Well developed, well nourished, in no acute distress HEENT:  Normocephalic and atramatic Neck:  No JVD.  Lungs: Clear bilaterally to auscultation and percussion. Heart: HRRR . Normal S1 and S2 without gallops or murmurs.  Abdomen: Bowel sounds are positive, abdomen soft and non-tender  Msk:  Back normal, normal gait. Normal strength and tone for age. Extremities: No clubbing, cyanosis or edema.   Neuro: Disoriented Psych: Disoriented   LABS: Basic Metabolic Panel: Recent Labs    12/02/19 0552 12/03/19 0510  NA 147* 147*  K 2.9* 3.4*  CL 106 111  CO2 32 28  GLUCOSE 109* 97  BUN 28* 28*  CREATININE 0.74 0.70  CALCIUM 8.0* 8.2*  MG  --  2.1  PHOS  --  3.1   Liver Function Tests: No results for input(s): AST, ALT, ALKPHOS, BILITOT, PROT, ALBUMIN in the last 72 hours. No results for input(s): LIPASE, AMYLASE in the last 72 hours. CBC: Recent Labs    12/02/19 0552 12/03/19 0510  WBC 13.7* 12.7*  NEUTROABS 9.8* 8.8*  HGB 11.4* 11.3*  HCT 35.7* 35.5*  MCV 100.8* 101.7*  PLT 181 197   Cardiac Enzymes: No results for input(s): CKTOTAL, CKMB, CKMBINDEX, TROPONINI in the last 72 hours. BNP: Invalid input(s): POCBNP D-Dimer: No results for input(s): DDIMER in the last 72 hours. Hemoglobin A1C: No results for input(s): HGBA1C in the last 72 hours. Fasting Lipid Panel: No results for input(s): CHOL, HDL, LDLCALC, TRIG,  CHOLHDL, LDLDIRECT in the last 72 hours. Thyroid Function Tests: No results for input(s): TSH, T4TOTAL, T3FREE, THYROIDAB in the last 72 hours.  Invalid input(s): FREET3 Anemia Panel: No results for input(s): VITAMINB12, FOLATE, FERRITIN, TIBC, IRON, RETICCTPCT in the last 72 hours.  No results found.   Echo LVEF 50 to 55%  TELEMETRY: Sinus rhythm:  ASSESSMENT AND PLAN:  Active Problems:   Cardiac arrest [facility])   STEMI involving right coronary artery ([place])    1.  Inferior STEMI, primary PCI with DES RCA, with preserved left ventricular function, dual antiplatelet therapy 2.  Cardiac arrest, hypothermia protocol 3.  Respiratory failure, extubated 4.  Polysubstance abuse with recent cocaine use 5.  Disoriented/delirium, status post back arrest, history of polysubstance, and recent cocaine use  Recommendations  1.  Resume low-dose aspirin and Brilinta p.o., uninterrupted for 1 year 2.  Resume rosuvastatin p.o. 3.  Start metoprolol tartrate 25 mg twice daily 4.  No further cardiac diagnostics at this time   Isaias Cowman, MD, PhD, [facility] 12/03/2019 9:34 AM

## 2019-12-03 NOTE — Progress Notes (Signed)
CRITICAL CARE PROGRESS NOTE    Name: Lawernce Earll MRN: [id number] DOB: 1956/04/13     LOS: 10   SUBJECTIVE FINDINGS & SIGNIFICANT EVENTS   Patient description:  Pt with hx of polysubstance abuse,  Lines / Drains: PIVx2 Cultures / Sepsis markers: n/a Antibiotics: zosyn Protocols / Consultants: neurology  Tests / Events: EEG  CC Follow up Resp failure  HPI intermittent agitation On precedex Cocaine induced delirium May need CT head S/p hypothermia protocol   12/24-intubated/Vent support, cardiac arrest, s/p CATH-100% OCCLUSION OF RCA, COCAINE TOXICITY 12/24-HYPOTHERMIA PROTOCOL 12/24 - Critically ill, requiring 3 vasopressors, 100% FiO2 & 11PEEP 12/25 remains on pressors, on vent, fio2 100% 12/26 remains on hypothermia protocol 12/27 completed hypothermia protocol 12/28- remains with high fiO2 requirement and difficulty to wean 12/29 - recruitment maneuvers done, remains hypoxemic poorly responsive with weaning of sedation.  Discussed with son at bedside today.  12/30- patient remains with episodes of hypoglycemia. Plan is to diurese today as well as control CBG and BP better with SBP after 12/31 - post aggressive diuresis overnight, SBT today with plan for liberation from MV if pass.  1/1 - patient is more awake , he will have another weaning SBT trial today  1/2 -patient was successfully extubated to BiPAP, required Precedex overnight due to residual agitation/withdrawal secondary to polysubstance abuse 1/3 remains on precedex   PAST MEDICAL HISTORY   Past Medical History:  Diagnosis Date  . COPD (chronic obstructive pulmonary disease) ([place])   . ED (erectile dysfunction)   . Hypertension   . Lumbar disc disease      SURGICAL HISTORY   Past Surgical History:  Procedure  Laterality Date  . APPENDECTOMY    . CORONARY STENT INTERVENTION N/A 11/23/2019   Procedure: CORONARY STENT INTERVENTION;  Surgeon: Yolonda Kida, MD;  Location: [facility];  Service: Cardiovascular;  Laterality: N/A;  RCA  . CORONARY/GRAFT ACUTE MI REVASCULARIZATION N/A 11/23/2019   Procedure: Coronary/Graft Acute MI Revascularization;  Surgeon: Yolonda Kida, MD;  Location: [facility];  Service: Cardiovascular;  Laterality: N/A;  . LEFT HEART CATH AND CORONARY ANGIOGRAPHY N/A 11/23/2019   Procedure: LEFT HEART CATH AND CORONARY ANGIOGRAPHY;  Surgeon: Yolonda Kida, MD;  Location: [facility];  Service: Cardiovascular;  Laterality: N/A;     FAMILY HISTORY   Family History  Problem Relation Age of Onset  . Hyperlipidemia Father   . Cancer Father   . Heart disease Father        heart valve disease (from Agent Orange?)  . Alcohol abuse Mother   . Hypertension Neg Hx   . Diabetes Neg Hx   . Colon cancer Neg Hx   . Colon polyps Neg Hx   . Pancreatic cancer Neg Hx   . Rectal cancer Neg Hx   . Stomach cancer Neg Hx      SOCIAL HISTORY   Social History   Tobacco Use  . Smoking status: Current Every Day Smoker  . Smokeless tobacco: Never Used  Substance Use Topics  . Alcohol use: Yes  . Drug use: Yes    Types: Marijuana     MEDICATIONS   Current Medication:  Current Facility-Administered Medications:  .  0.9 %  sodium chloride infusion, , Intravenous, Continuous, Funke, Mary E, MD .  0.9 %  sodium chloride infusion, , Intravenous, Continuous, Tymel Conely, MD .  acetaminophen (TYLENOL) tablet 650 mg, 650 mg, Oral, Q4H PRN, Flora Lipps, MD .  acetaminophen (TYLENOL) tablet 650 mg, 650 mg, Oral, Q4H PRN, Callwood, Dwayne D, MD .  albuterol (PROVENTIL) (2.5 MG/3ML) 0.083% nebulizer solution 2.5 mg, 2.5 mg, Nebulization, Q2H PRN, Ellenore Roscoe, MD .  aspirin chewable tablet 81 mg, 81 mg, Per Tube, Daily, Benita Gutter, RPH, 81  mg at 12/01/19 1001 .  atropine 1 MG/10ML injection 1 mg, 1 mg, Intravenous, PRN, Awilda Bill, NP .  cefTRIAXone (ROCEPHIN) 2 g in sodium chloride 0.9 % 100 mL IVPB, 2 g, Intravenous, Q12H, Aleskerov, Fuad, MD, Last Rate: 200 mL/hr at 12/02/19 2205, 2 g at 12/02/19 2205 .  chlorhexidine gluconate (MEDLINE KIT) (PERIDEX) 0.12 % solution 15 mL, 15 mL, Mouth Rinse, BID, [name] D, NP, 15 mL at 12/02/19 2057 .  Chlorhexidine Gluconate Cloth 2 % PADS 6 each, 6 each, Topical, Daily, Ottie Glazier, MD, 6 each at 12/02/19 2105 .  dexmedetomidine (PRECEDEX) 400 MCG/100ML (4 mcg/mL) infusion, 0.4-1.2 mcg/kg/hr, Intravenous, Titrated, Aleskerov, Fuad, MD, Last Rate: 14.39 mL/hr at 12/03/19 0528, 0.6 mcg/kg/hr at 12/03/19 0528 .  dextrose 50 % solution 50 mL, 1 ampule, Intravenous, Once, Awilda Bill, NP .  enoxaparin (LOVENOX) injection 40 mg, 40 mg, Subcutaneous, Q24H, Lanney Gins, Fuad, MD, 40 mg at 12/02/19 1239 .  famotidine (PEPCID) IVPB 20 mg premix, 20 mg, Intravenous, Q12H, Chiffon Kittleson, MD, Last Rate: 100 mL/hr at 12/02/19 2104, 20 mg at 12/02/19 2104 .  feeding supplement (PIVOT 1.5 CAL) liquid 1,000 mL, 1,000 mL, Per Tube, Continuous, Ottie Glazier, MD, Stopped at 12/01/19 1338 .  folic acid injection 1 mg, 1 mg, Intravenous, Daily, Blakeney, Dreama Saa, NP .  glycopyrrolate (ROBINUL) injection 0.2 mg, 0.2 mg, Intravenous, Q4H PRN, Lanney Gins, Fuad, MD, 0.2 mg at 12/01/19 1320 .  hydrALAZINE (APRESOLINE) injection 10 mg, 10 mg, Intravenous, Q6H PRN, [name] D, NP, 10 mg at 11/30/19 1256 .  ipratropium-albuterol (DUONEB) 0.5-2.5 (3) MG/3ML nebulizer solution 3 mL, 3 mL, Nebulization, Q4H, Kaide Gage, MD, 3 mL at 12/03/19 0741 .  LORazepam (ATIVAN) tablet 1-4 mg, 1-4 mg, Oral, Q1H PRN **OR** LORazepam (ATIVAN) injection 1-4 mg, 1-4 mg, Intravenous, Q1H PRN, Awilda Bill, NP, 4 mg at 12/02/19 2047 .  methylPREDNISolone sodium succinate (SOLU-MEDROL) 40 mg/mL injection 40 mg,  40 mg, Intravenous, Q24H, Aleskerov, Fuad, MD, 40 mg at 12/02/19 1252 .  multivitamin liquid 15 mL, 15 mL, Per Tube, Daily, Lanney Gins, Fuad, MD, 15 mL at 12/01/19 1001 .  ondansetron (ZOFRAN) injection 4 mg, 4 mg, Intravenous, Q6H PRN, Callwood, Dwayne D, MD .  potassium chloride 10 mEq in 50 mL *CENTRAL LINE* IVPB, 10 mEq, Intravenous, Q1 Hr x 2, Blakeney, Dana G, NP .  rosuvastatin (CRESTOR) tablet 40 mg, 40 mg, Per Tube, q1800, Ottie Glazier, MD, 40 mg at 11/30/19 1701 .  sodium chloride flush (NS) 0.9 % injection 3 mL, 3 mL, Intravenous, PRN, Callwood, Dwayne D, MD, 3 mL at 11/30/19 0931 .  thiamine tablet 100 mg, 100 mg, Oral, Daily **OR** thiamine (B-1) injection 100 mg, 100 mg, Intravenous, Daily, Awilda Bill, NP, 100 mg at 12/02/19 2053 .  ticagrelor (BRILINTA) tablet 90 mg, 90 mg, Per Tube, BID, Benita Gutter, RPH, 90 mg at 12/01/19 1001  REVIEW OF SYSTEMS  PATIENT IS UNABLE TO PROVIDE COMPLETE REVIEW OF SYSTEM S DUE TO SEVERE CRITICAL ILLNESS AND ENCEPHALOPATHY   PHYSICAL EXAMINATION   Vital Signs: Temp:  [97.7 F (36.5 C)-100.6 F (38.1 C)] 97.7 F (36.5 C) (01/03 0500) Pulse Rate:  [52-79]  52 (01/03 0500) Resp:  [13-25] 19 (01/03 0500) BP: (114-152)/(71-92) 140/84 (01/03 0400) SpO2:  [91 %-98 %] 96 % (01/03 0500) FiO2 (%):  [28 %] 28 % (01/02 1209) Weight:  [84.8 kg] 84.8 kg (01/03 0500)  PHYSICAL EXAMINATION:  GENERAL:critically ill appearing, HEAD: Normocephalic, atraumatic.  EYES: Pupils equal, round, reactive to light.  No scleral icterus.  MOUTH: Moist mucosal membrane. NECK: Supple. No thyromegaly. No nodules. No JVD.  PULMONARY: +rhonchi,  CARDIOVASCULAR: S1 and S2. Regular rate and rhythm. No murmurs, rubs, or gallops.  GASTROINTESTINAL: Soft, nontender, -distended. Positive bowel sounds.  MUSCULOSKELETAL: No swelling, clubbing, or edema.  NEUROLOGIC: agitated, on precedex SKIN:intact,warm,dry     PERTINENT DATA     Infusions: . sodium  chloride    . sodium chloride    . cefTRIAXone (ROCEPHIN)  IV 2 g (12/02/19 2205)  . dexmedetomidine (PRECEDEX) IV infusion 0.6 mcg/kg/hr (12/03/19 0528)  . famotidine (PEPCID) IV 20 mg (12/02/19 2104)  . feeding supplement (PIVOT 1.5 CAL) Stopped (12/01/19 1338)  . potassium chloride     Scheduled Medications: . aspirin  81 mg Per Tube Daily  . chlorhexidine gluconate (MEDLINE KIT)  15 mL Mouth Rinse BID  . Chlorhexidine Gluconate Cloth  6 each Topical Daily  . dextrose  1 ampule Intravenous Once  . enoxaparin (LOVENOX) injection  40 mg Subcutaneous Q24H  . folic acid  1 mg Intravenous Daily  . ipratropium-albuterol  3 mL Nebulization Q4H  . methylPREDNISolone (SOLU-MEDROL) injection  40 mg Intravenous Q24H  . multivitamin  15 mL Per Tube Daily  . rosuvastatin  40 mg Per Tube q1800  . thiamine  100 mg Oral Daily   Or  . thiamine  100 mg Intravenous Daily  . ticagrelor  90 mg Per Tube BID   PRN Medications: acetaminophen, acetaminophen, albuterol, atropine, glycopyrrolate, hydrALAZINE, LORazepam **OR** LORazepam, ondansetron (ZOFRAN) IV, sodium chloride flush Hemodynamic parameters:   Intake/Output: 01/02 0701 - 01/03 0700 In: 983.4 [P.O.:120; I.V.:463.4; IV Piggyback:400] Out: 1650 [Urine:1650]  Ventilator  Settings: FiO2 (%):  [28 %] 28 %   Other Labs:     LAB RESULTS:  Basic Metabolic Panel: Recent Labs  Lab 11/28/19 0345 11/29/19 0359 11/30/19 0428 12/01/19 0359 12/02/19 0552 12/03/19 0510  NA 144 142 147* 148* 147* 147*  K 3.2* 4.7 3.6 3.2* 2.9* 3.4*  CL 109 109 107 111 106 111  CO2 30 30 33* 30 32 28  GLUCOSE 94 130* 144* 155* 109* 97  BUN 28* 27* 30* 31* 28* 28*  CREATININE 0.75 0.74 0.87 0.61 0.74 0.70  CALCIUM 7.7* 7.7* 8.0* 8.1* 8.0* 8.2*  MG 2.3  --   --   --   --  2.1  PHOS 3.1  --   --   --   --  3.1   Liver Function Tests: Recent Labs  Lab 11/27/19 0403 11/28/19 0345 11/29/19 0359 11/30/19 0428  AST 70* 45* 34 39  ALT 46* 39 36 39   ALKPHOS 37* 36* 37* 39  BILITOT 0.9 0.7 0.7 0.6  PROT 5.1* 4.9* 5.1* 5.7*  ALBUMIN 2.4* 2.3* 2.3* 2.5*   No results for input(s): LIPASE, AMYLASE in the last 168 hours. No results for input(s): AMMONIA in the last 168 hours. CBC: Recent Labs  Lab 11/29/19 0359 11/30/19 0428 12/01/19 0359 12/02/19 0552 12/03/19 0510  WBC 9.6 12.0* 13.7* 13.7* 12.7*  NEUTROABS 7.7 8.3* 10.2* 9.8* 8.8*  HGB 10.5* 12.0* 11.5* 11.4* 11.3*  HCT 33.1* 35.5* 35.4* 35.7*  35.5*  MCV 104.4* 98.1 100.0 100.8* 101.7*  PLT 113* 141* 157 181 197   Cardiac Enzymes: No results for input(s): CKTOTAL, CKMB, CKMBINDEX, TROPONINI in the last 168 hours. BNP: Invalid input(s): POCBNP CBG: Recent Labs  Lab 12/02/19 1147 12/02/19 1602 12/02/19 1953 12/02/19 2340 12/03/19 0349  GLUCAP 97 114* 112* 119* 80     IMAGING RESULTS:  Imaging: No results found.      ASSESSMENT AND PLAN      64 yo white male with acute and severe hypoxic respiratory failure from acute V fib cardiac arrest and sudden cardiac deathSTEMI from acute RCA OCCLUSION andfrom COCAINE Toxicity. DX  Cardiogenic shock requiring multiple vasopressors. Concern for possible aspiration pneumonia, now s/p extubation  Severe ACUTE Hypoxic and Hypercapnic Respiratory Failure S/p extubation Oxygen as needed Lasix as tolerated High risk for re-intubation  Acute MI Cocaine and CAD Follow up Cardiology recs   NEUROLOGY delirium from cardiogenic shock and near death And cocaine abuse may need CT or MRI brain for further assessment    Critical Care Time devoted to patient care services described in this note is 32 minutes.   Overall, patient is critically ill, prognosis is guarded.   Corrin Parker, M.D.  [facility]  Medical Director [place] Director [facility] Cardio-Pulmonary Department

## 2019-12-03 NOTE — Progress Notes (Signed)
1756 tachy arrhythmia 161. Pt reported palpitations  Paraschos, MD and Mortimer Fries, MD notified. See new orders.

## 2019-12-03 NOTE — Progress Notes (Signed)
PO Meds held. Pt NPO and Confused on precedex drip. MD aware.

## 2019-12-03 NOTE — Progress Notes (Signed)
Urine reddish brown. Dr. Mortimer Fries notified. No new orders at this time.

## 2019-12-03 NOTE — Progress Notes (Signed)
  PROGRESS NOTE    Kurt [name]  [id number] DOB: 1956-05-11 DOA: 11/23/2019  PCP: Venia Carbon, MD    LOS - 10     Assuming care of this patient this afternoon.  He was initially admitted to ICU on 12/24 after V-fib cardiac arrest due to STEMI.  Status post primary PCI with DES to RCA, now on aspirin and Brilinta, being followed by cardiology.  Of note, patient has history of cocaine abuse.  Patient was extubated yesterday, had some agitation overnight requiring Precedex.  This was thought due to ICU delirium and possible withdrawal from cocaine or other substances.  He was able to be weaned from [place] and is now stable will be followed by hospitalist service.  Progress notes by PCCM and Cardiology reviewed in detail and I agree with the assessment and plans as outlined therein.  In addition:  --PT/OT evaluations --Dietician consult --monitor electrolytes, renal function, CBC --monitor mental status for any delirium, consider imaging if not improving, but suspect this is due to acute illness, cardiac arrest, complicated ICU stay   Dispo: pending therapy evaluations, expect d/c home in 2-3 days if stable.   Ezekiel Slocumb, DO [facility]   If 7PM-7AM, please contact night-coverage [email or web address] Password [facility] 12/03/2019, 2:43 PM

## 2019-12-03 NOTE — Progress Notes (Signed)
Pt is A&OX4 and calm sitting up in bed talking with son. Precedex drip is off.

## 2019-12-03 NOTE — Progress Notes (Signed)
Assisted tele visit to patient with family member.  Nitika Jackowski P, RN  

## 2019-12-03 NOTE — Progress Notes (Signed)
Alert and awake Off precedex  Ok to transfer to gen med floor in next 12 -24 hrs Transfer to [facility] service

## 2019-12-04 DIAGNOSIS — J9602 Acute respiratory failure with hypercapnia: Secondary | ICD-10-CM

## 2019-12-04 DIAGNOSIS — F1490 Cocaine use, unspecified, uncomplicated: Secondary | ICD-10-CM

## 2019-12-04 DIAGNOSIS — J9600 Acute respiratory failure, unspecified whether with hypoxia or hypercapnia: Secondary | ICD-10-CM

## 2019-12-04 DIAGNOSIS — J9601 Acute respiratory failure with hypoxia: Secondary | ICD-10-CM

## 2019-12-04 LAB — CBC WITH DIFFERENTIAL/PLATELET
Abs Immature Granulocytes: 0.12 10*3/uL — ABNORMAL HIGH (ref 0.00–0.07)
Basophils Absolute: 0 10*3/uL (ref 0.0–0.1)
Basophils Relative: 0 %
Eosinophils Absolute: 0 10*3/uL (ref 0.0–0.5)
Eosinophils Relative: 0 %
HCT: 36.5 % — ABNORMAL LOW (ref 39.0–52.0)
Hemoglobin: 12.2 g/dL — ABNORMAL LOW (ref 13.0–17.0)
Immature Granulocytes: 1 %
Lymphocytes Relative: 12 %
Lymphs Abs: 1.6 10*3/uL (ref 0.7–4.0)
MCH: 32.5 pg (ref 26.0–34.0)
MCHC: 33.4 g/dL (ref 30.0–36.0)
MCV: 97.3 fL (ref 80.0–100.0)
Monocytes Absolute: 1.7 10*3/uL — ABNORMAL HIGH (ref 0.1–1.0)
Monocytes Relative: 12 %
Neutro Abs: 10.5 10*3/uL — ABNORMAL HIGH (ref 1.7–7.7)
Neutrophils Relative %: 75 %
Platelets: 221 10*3/uL (ref 150–400)
RBC: 3.75 MIL/uL — ABNORMAL LOW (ref 4.22–5.81)
RDW: 13 % (ref 11.5–15.5)
WBC: 14 10*3/uL — ABNORMAL HIGH (ref 4.0–10.5)
nRBC: 0 % (ref 0.0–0.2)

## 2019-12-04 LAB — BASIC METABOLIC PANEL
Anion gap: 8 (ref 5–15)
BUN: 27 mg/dL — ABNORMAL HIGH (ref 8–23)
CO2: 26 mmol/L (ref 22–32)
Calcium: 8.4 mg/dL — ABNORMAL LOW (ref 8.9–10.3)
Chloride: 112 mmol/L — ABNORMAL HIGH (ref 98–111)
Creatinine, Ser: 0.79 mg/dL (ref 0.61–1.24)
GFR calc Af Amer: >60 mL/min (ref >60)
GFR calc non Af Amer: >60 mL/min (ref >60)
Glucose, Bld: 143 mg/dL — ABNORMAL HIGH (ref 70–99)
Potassium: 3 mmol/L — ABNORMAL LOW (ref 3.5–5.1)
Sodium: 146 mmol/L — ABNORMAL HIGH (ref 135–145)

## 2019-12-04 LAB — GLUCOSE, CAPILLARY
Glucose-Capillary: 104 mg/dL — ABNORMAL HIGH (ref 70–99)
Glucose-Capillary: 108 mg/dL — ABNORMAL HIGH (ref 70–99)
Glucose-Capillary: 125 mg/dL — ABNORMAL HIGH (ref 70–99)
Glucose-Capillary: 69 mg/dL — ABNORMAL LOW (ref 70–99)
Glucose-Capillary: 78 mg/dL (ref 70–99)
Glucose-Capillary: 83 mg/dL (ref 70–99)

## 2019-12-04 LAB — CULTURE, RESPIRATORY W GRAM STAIN

## 2019-12-04 LAB — TROPONIN I (HIGH SENSITIVITY): Troponin I (High Sensitivity): 129 ng/L (ref <18)

## 2019-12-04 LAB — MAGNESIUM: Magnesium: 2 mg/dL (ref 1.7–2.4)

## 2019-12-04 MED ORDER — LISINOPRIL 5 MG PO TABS
5.0000 mg | ORAL_TABLET | Freq: Every day | ORAL | Status: DC
  Administered 2019-12-04 – 2019-12-11 (×8): 5 mg via ORAL
  Filled 2019-12-04 (×8): qty 1

## 2019-12-04 MED ORDER — POTASSIUM CHLORIDE 10 MEQ/100ML IV SOLN
10.0000 meq | INTRAVENOUS | Status: AC
  Administered 2019-12-04 (×4): 10 meq via INTRAVENOUS
  Filled 2019-12-04 (×4): qty 100

## 2019-12-04 MED ORDER — ADULT MULTIVITAMIN W/MINERALS CH
1.0000 | ORAL_TABLET | Freq: Every day | ORAL | Status: DC
  Administered 2019-12-05 – 2019-12-11 (×7): 1 via ORAL
  Filled 2019-12-04 (×7): qty 1

## 2019-12-04 MED ORDER — FAMOTIDINE 20 MG PO TABS
20.0000 mg | ORAL_TABLET | Freq: Two times a day (BID) | ORAL | Status: DC
  Administered 2019-12-04 – 2019-12-11 (×14): 20 mg via ORAL
  Filled 2019-12-04 (×14): qty 1

## 2019-12-04 MED ORDER — FOLIC ACID 1 MG PO TABS
1.0000 mg | ORAL_TABLET | Freq: Every day | ORAL | Status: DC
  Administered 2019-12-05 – 2019-12-11 (×7): 1 mg via ORAL
  Filled 2019-12-04 (×7): qty 1

## 2019-12-04 MED ORDER — ENSURE ENLIVE PO LIQD
237.0000 mL | Freq: Two times a day (BID) | ORAL | Status: DC
  Administered 2019-12-05 – 2019-12-06 (×3): 237 mL via ORAL

## 2019-12-04 MED ORDER — DEXTROSE 50 % IV SOLN
INTRAVENOUS | Status: AC
  Filled 2019-12-04: qty 50

## 2019-12-04 NOTE — Progress Notes (Signed)
Nutrition Follow-up  DOCUMENTATION CODES:   Not applicable  INTERVENTION:  Provide Ensure Enlive po BID, each supplement provides 350 kcal and 20 grams of protein.  NUTRITION DIAGNOSIS:   Inadequate oral intake related to inability to eat as evidenced by NPO status.  Resolving - diet now advanced.  GOAL:   Patient will meet greater than or equal to 90% of their needs  Progressing.  MONITOR:   PO intake, Supplement acceptance, Labs, Weight trends, I & O's  REASON FOR ASSESSMENT:   Ventilator    ASSESSMENT:   64 year old male with PMHx of COPD, ED, HTN admitted with acute cardiac arrest and acute STEMI with RCA occlusion associated with cocaine toxicity s/p PCI to proximal RCA, s/p 36C TTM protocol.  Patient was extubated on 1/2. Diet was advanced to heart healthy on 1/3. According to chart patient ate 50% of lunch today. No other meal completion has been documented. Met with patient and his son in room. Patient reports his appetite is decreased. He reports he does not like the food here. Discussed importance of adequate intake. After encouragement he is amenable to trying oral nutrition supplements to help meet calorie/protein needs.  Medications reviewed and include: famotidine, folic acid 1 mg daily, lisinopril, Solu-Medrol 40 mg Q24hrs IV, MVI daily, thiamine 100 mg daily.  Labs reviewed: CBG 69-125, Sodium 146, Potassium 3, Chloride 112, BUN 27.  Diet Order:   Diet Order            Diet Heart Room service appropriate? Yes; Fluid consistency: Thin  Diet effective now             EDUCATION NEEDS:   No education needs have been identified at this time  Skin:  Skin Assessment: Reviewed RN Assessment  Last BM:  11/29/2019 - type 7 per rectal tube  Height:   Ht Readings from Last 1 Encounters:  11/23/19 '5\' 9"'  (1.753 m)   Weight:   Wt Readings from Last 1 Encounters:  12/04/19 85.1 kg   Ideal Body Weight:  72.7 kg  BMI:  Body mass index is 27.71  kg/m.  Estimated Nutritional Needs:   Kcal:  1950-2150  Protein:  100-124 grams (1.2-1.5 grams/kg)  Fluid:  2-2.3 L/day  Jacklynn Barnacle, MS, RD, LDN Office: [phone] Pager: [phone] After Hours/Weekend Pager: [phone]

## 2019-12-04 NOTE — Progress Notes (Signed)
Pt has a severity score of 2 on the RT protocol assessment. His lungs are diminished but clear. His O2  been weaned to 2L. Nebulizer treatments are to be changed to Q6 prn.

## 2019-12-04 NOTE — Evaluation (Signed)
Physical Therapy Evaluation Patient Details Name: Kurt [name] MRN: [id number] DOB: October 01, 1956 Today's Date: 12/04/2019   History of Present Illness  Pt is a 64 y.o. male found down on scene with lit cigarette.  Pt noted with cardiac arrest; intubated and s/p cath 12/24.  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  Clinical Impression  Prior to hospital admission, pt reports being independent and working full time at a golf course; lives alone but son lives next door.  Some generalized confusion noted during session; pt appearing very motivated to participate in therapy.  Pt appearing significantly globally weak in B UE's and LE's; deferred OOB mobility d/t pt becoming SOB with increased respiration rate with minimal ex's in bed (nurse also reporting MD did not want pt to overdo it with activity).  Pt would benefit from skilled PT to address noted impairments and functional limitations (see below for any additional details).  Upon hospital discharge, pt would benefit from STR (will monitor pt's progress with therapy;  pt may be able to progress to CIR).    Follow Up Recommendations SNF    Equipment Recommendations  Rolling walker with 5" wheels;3in1 (PT);Wheelchair (measurements PT);Wheelchair cushion (measurements PT);Hospital bed(hoyer lift)    Recommendations for Other Services OT consult     Precautions / Restrictions Precautions Precautions: Fall Restrictions Weight Bearing Restrictions: No      Mobility  Bed Mobility               General bed mobility comments: Deferred d/t pt SOB with activities in bed  Transfers                    Ambulation/Gait                Stairs            Wheelchair Mobility     Modified Rankin (Stroke Patients Only)       Balance                                             Pertinent Vitals/Pain Pain Assessment: No/denies pain    Home Living Family/patient expects to be discharged to:: Private residence Living Arrangements: Alone Available Help at Discharge: Family;Available PRN/intermittently(Son lives next door) Type of Home: House Home Access: Stairs to enter Entrance Stairs-Rails: Right;Left;Can reach both Technical brewer of Steps: 4 Home Layout: One level Home Equipment: [place] - 4 wheels;Tub bench(per OT note all DME was pt's wife's who passed away in 2023/09/12.)      Prior Function Level of Independence: Independent         Comments: Pt reports (+) driving; working full time at golf course; active.     Hand Dominance   Dominant Hand: Right    Extremity/Trunk Assessment   Upper Extremity Assessment Upper Extremity Assessment: Generalized weakness;Defer to OT evaluation    Lower Extremity Assessment Lower Extremity Assessment: RLE deficits/detail;LLE deficits/detail RLE Deficits / Details: 1/5 DF/PF; poor to fair quad set; hip flexion at least 2+/5; DF to neutral ROM LLE Deficits / Details: 1/5 DF/PF; poor to fair quad set; hip flexion at least 2+/5; DF to neutral ROM       Communication  Communication: No difficulties  Cognition Arousal/Alertness: Awake/alert Behavior During Therapy: WFL for tasks assessed/performed Overall Cognitive Status: Impaired/Different from baseline Area of Impairment: Orientation;Attention;Memory;Following commands;Safety/judgement;Awareness                 Orientation Level: Disoriented to;Time;Situation Current Attention Level: Alternating Memory: Decreased short-term memory Following Commands: Follows one step commands with increased time;Follows multi-step commands inconsistently Safety/Judgement: Decreased awareness of safety;Decreased awareness of  deficits Awareness: Anticipatory          General Comments   Nursing cleared pt for participation in physical therapy.  Pt agreeable to PT session.    Exercises General Exercises - Lower Extremity Short Arc Quad: AAROM;Strengthening;Both;10 reps;Supine Heel Slides: AAROM;Strengthening;Both;10 reps;Supine Other Exercises Other Exercises: 2x30 seconds B heelcord stretch   Assessment/Plan    PT Assessment Patient needs continued PT services  PT Problem List Decreased strength;Decreased range of motion;Decreased activity tolerance;Decreased balance;Decreased mobility;Decreased cognition;Decreased knowledge of use of DME;Cardiopulmonary status limiting activity;Decreased safety awareness;Decreased knowledge of precautions       PT Treatment Interventions DME instruction;Gait training;Stair training;Functional mobility training;Therapeutic activities;Therapeutic exercise;Balance training;Neuromuscular re-education;Cognitive remediation;Patient/family education    PT Goals (Current goals can be found in the Care Plan section)  Acute Rehab PT Goals Patient Stated Goal: to be able to walk again PT Goal Formulation: With patient Time For Goal Achievement: 12/18/19 Potential to Achieve Goals: Good    Frequency Min 2X/week   Barriers to discharge Decreased caregiver support      Co-evaluation               AM-PAC PT "6 Clicks" Mobility  Outcome Measure Help needed turning from your back to your side while in a flat bed without using bedrails?: A Lot Help needed moving from lying on your back to sitting on the side of a flat bed without using bedrails?: A Lot Help needed moving to and from a bed to a chair (including a wheelchair)?: Total Help needed standing up from a chair using your arms (e.g., wheelchair or bedside chair)?: Total Help needed to walk in hospital room?: Total Help needed climbing 3-5 steps with a railing? : Total 6 Click Score: 8    End of Session  Equipment Utilized During Treatment: Oxygen Activity Tolerance: Patient limited by fatigue Patient left: in bed;with call bell/phone within reach;with nursing/sitter in room;Other (comment)(B heels floating via pillows) Nurse Communication: Mobility status;Precautions;Other (comment)(Pt's symptoms and vitals during session) PT Visit Diagnosis: Other abnormalities of gait and mobility (R26.89);Muscle weakness (generalized) (M62.81);Difficulty in walking, not elsewhere classified (R26.2)    Time: [id number] PT Time Calculation (min) (ACUTE ONLY): 22 min   Charges:   PT Evaluation $PT Eval Low Complexity: 1 Low PT Treatments $Therapeutic Exercise: 8-22 mins        Leitha Bleak, PT 12/04/19, 2:39 PM

## 2019-12-04 NOTE — Progress Notes (Signed)
[facility]  SUBJECTIVE: Laying in bed, much more alert, answering questions appropriately, denies chest pain or shortness of breath   Vitals:   12/04/19 0352 12/04/19 0400 12/04/19 0500 12/04/19 0600  BP:  (!) 142/93 (!) 152/107 (!) 159/96  Pulse:  89 92 84  Resp:  17 19 (!) 24  Temp:  99.9 F (37.7 C) 100 F (37.8 C) 100 F (37.8 C)  TempSrc:      SpO2:  97% 98% 97%  Weight: 85.1 kg     Height:         Intake/Output Summary (Last 24 hours) at 12/04/2019 0736 Last data filed at 12/03/2019 2100 Gross per 24 hour  Intake --  Output 1960 ml  Net -1960 ml      PHYSICAL EXAM  General: Well developed, well nourished, in no acute distress HEENT:  Normocephalic and atramatic Neck:  No JVD.  Lungs: Clear bilaterally to auscultation and percussion. Heart: HRRR . Normal S1 and S2 without gallops or murmurs.  Abdomen: Bowel sounds are positive, abdomen soft and non-tender  Msk:  Back normal, normal gait. Normal strength and tone for age. Extremities: No clubbing, cyanosis or edema.   Neuro: Alert and oriented X 3. Psych:  Good affect, responds appropriately   LABS: Basic Metabolic Panel: Recent Labs    12/03/19 0510 12/04/19 0429  NA 147* 146*  K 3.4* 3.0*  CL 111 112*  CO2 28 26  GLUCOSE 97 143*  BUN 28* 27*  CREATININE 0.70 0.79  CALCIUM 8.2* 8.4*  MG 2.1  --   PHOS 3.1  --    Liver Function Tests: No results for input(s): AST, ALT, ALKPHOS, BILITOT, PROT, ALBUMIN in the last 72 hours. No results for input(s): LIPASE, AMYLASE in the last 72 hours. CBC: Recent Labs    12/03/19 0510 12/04/19 0429  WBC 12.7* 14.0*  NEUTROABS 8.8* 10.5*  HGB 11.3* 12.2*  HCT 35.5* 36.5*  MCV 101.7* 97.3  PLT 197 221   Cardiac Enzymes: Recent Labs    12/03/19 1922  CKMB 2.6   BNP: Invalid input(s): POCBNP D-Dimer: No results for input(s): DDIMER in the last 72 hours. Hemoglobin A1C: No results for input(s): HGBA1C in the last 72 hours. Fasting Lipid Panel: No  results for input(s): CHOL, HDL, LDLCALC, TRIG, CHOLHDL, LDLDIRECT in the last 72 hours. Thyroid Function Tests: No results for input(s): TSH, T4TOTAL, T3FREE, THYROIDAB in the last 72 hours.  Invalid input(s): FREET3 Anemia Panel: No results for input(s): VITAMINB12, FOLATE, FERRITIN, TIBC, IRON, RETICCTPCT in the last 72 hours.  No results found.   Echo LVEF 50 to 55%  TELEMETRY: Sinus rhythm:  ASSESSMENT AND PLAN:  Active Problems:   Cardiac arrest [facility])   STEMI involving right coronary artery ([place])    1.  Inferior STEMI, primary PCI with DES RCA, with preserved left ventricular function, on dual antiplatelet therapy 2.  Cardiac arrest, hypothermia protocol 3.  Respiratory failure, extubated 4.  Polysubstance abuse, with recent cocaine use 5.  Disorientation / delirium, as post cardiac arrest with history of polysubstance abuse, much improved today, answering questions appropriately  Recommendations  1.  Agree with overall current therapy 2.  Continue dual antiplatelet therapy uninterrupted for 1 year 3.  Continue rosuvastatin 4.  Continue metoprolol tartrate 50 mg twice daily 5.  Add lisinopril 5 mg daily 6.  No further cardiac diagnostics at this time   Isaias Cowman, MD, PhD, [facility] 12/04/2019 7:36 AM

## 2019-12-04 NOTE — Progress Notes (Signed)
Subjective: Alert and oriented to times place and person this morning.  Lying on bed more interactive.  Objective: Vital signs in last 24 hours: Temp:  [99.9 F (37.7 C)-101.1 F (38.4 C)] 100.4 F (38 C) (01/04 1500) Pulse Rate:  [76-111] 83 (01/04 1500) Resp:  [17-34] 24 (01/04 1500) BP: (142-171)/(86-107) 150/101 (01/04 1500) SpO2:  [90 %-99 %] 97 % (01/04 1500) FiO2 (%):  [28 %] 28 % (01/03 1932) Weight:  [85.1 kg] 85.1 kg (01/04 0352)  Intake/Output from previous day: 01/03 0701 - 01/04 0700 In: -  Out: 1960 [Urine:1960] Intake/Output this shift: Total I/O In: 240 [P.O.:240] Out: -   GENERAL: Appears stable, alert and oriented fully HEAD: Normocephalic, atraumatic.  EYES: Pupils equal, round, reactive to light.  No scleral icterus.  MOUTH: Moist mucosal membrane. NECK: Supple. No thyromegaly. No nodules. No JVD.  PULMONARY: Mostly clear some coarse breathing bibasilar CARDIOVASCULAR: S1 and S2. Regular rate and rhythm. No murmurs, rubs, or gallops.  GASTROINTESTINAL: Soft, nontender, -distended. Positive bowel sounds.  MUSCULOSKELETAL: No swelling, clubbing, or edema.  NEUROLOGIC: agitated, on precedex SKIN:intact,warm,dry  Results for orders placed or performed during the hospital encounter of 11/23/19 (from the past 24 hour(s))  Troponin I (High Sensitivity)     Status: Abnormal   Collection Time: 12/03/19  7:22 PM  Result Value Ref Range   Troponin I (High Sensitivity) 124 (HH) <18 ng/L  CKMB([facility] only)     Status: None   Collection Time: 12/03/19  7:22 PM  Result Value Ref Range   CK, MB 2.6 0.5 - 5.0 ng/mL  Glucose, capillary     Status: Abnormal   Collection Time: 12/03/19  7:29 PM  Result Value Ref Range   Glucose-Capillary 69 (L) 70 - 99 mg/dL  Glucose, capillary     Status: Abnormal   Collection Time: 12/03/19  8:28 PM  Result Value Ref Range   Glucose-Capillary 108 (H) 70 - 99 mg/dL  Glucose, capillary     Status: None   Collection Time: 12/03/19  11:34 PM  Result Value Ref Range   Glucose-Capillary 77 70 - 99 mg/dL  Glucose, capillary     Status: Abnormal   Collection Time: 12/04/19  3:41 AM  Result Value Ref Range   Glucose-Capillary 69 (L) 70 - 99 mg/dL  CBC with Differential/Platelet     Status: Abnormal   Collection Time: 12/04/19  4:29 AM  Result Value Ref Range   WBC 14.0 (H) 4.0 - 10.5 K/uL   RBC 3.75 (L) 4.22 - 5.81 MIL/uL   Hemoglobin 12.2 (L) 13.0 - 17.0 g/dL   HCT 36.5 (L) 39.0 - 52.0 %   MCV 97.3 80.0 - 100.0 fL   MCH 32.5 26.0 - 34.0 pg   MCHC 33.4 30.0 - 36.0 g/dL   RDW 13.0 11.5 - 15.5 %   Platelets 221 150 - 400 K/uL   nRBC 0.0 0.0 - 0.2 %   Neutrophils Relative % 75 %   Neutro Abs 10.5 (H) 1.7 - 7.7 K/uL   Lymphocytes Relative 12 %   Lymphs Abs 1.6 0.7 - 4.0 K/uL   Monocytes Relative 12 %   Monocytes Absolute 1.7 (H) 0.1 - 1.0 K/uL   Eosinophils Relative 0 %   Eosinophils Absolute 0.0 0.0 - 0.5 K/uL   Basophils Relative 0 %   Basophils Absolute 0.0 0.0 - 0.1 K/uL   Immature Granulocytes 1 %   Abs Immature Granulocytes 0.12 (H) 0.00 - 0.07 K/uL  Basic metabolic  panel     Status: Abnormal   Collection Time: 12/04/19  4:29 AM  Result Value Ref Range   Sodium 146 (H) 135 - 145 mmol/L   Potassium 3.0 (L) 3.5 - 5.1 mmol/L   Chloride 112 (H) 98 - 111 mmol/L   CO2 26 22 - 32 mmol/L   Glucose, Bld 143 (H) 70 - 99 mg/dL   BUN 27 (H) 8 - 23 mg/dL   Creatinine, Ser 0.79 0.61 - 1.24 mg/dL   Calcium 8.4 (L) 8.9 - 10.3 mg/dL   GFR calc non Af Amer >60 >60 mL/min   GFR calc Af Amer >60 >60 mL/min   Anion gap 8 5 - 15  Troponin I (High Sensitivity)     Status: Abnormal   Collection Time: 12/04/19  4:29 AM  Result Value Ref Range   Troponin I (High Sensitivity) 129 (HH) <18 ng/L  Magnesium     Status: None   Collection Time: 12/04/19  4:29 AM  Result Value Ref Range   Magnesium 2.0 1.7 - 2.4 mg/dL  Glucose, capillary     Status: None   Collection Time: 12/04/19  7:40 AM  Result Value Ref Range    Glucose-Capillary 78 70 - 99 mg/dL  Glucose, capillary     Status: Abnormal   Collection Time: 12/04/19 11:35 AM  Result Value Ref Range   Glucose-Capillary 108 (H) 70 - 99 mg/dL  Glucose, capillary     Status: Abnormal   Collection Time: 12/04/19  3:45 PM  Result Value Ref Range   Glucose-Capillary 125 (H) 70 - 99 mg/dL    Studies/Results: DG Chest 1 View  Result Date: 11/23/2019 CLINICAL DATA:  Intubation. Cardiac arrest. EXAM: CHEST  1 VIEW COMPARISON:  03/16/2018 FINDINGS: Endotracheal tube is in good position 3 cm above the carina. Heart size and pulmonary vascularity are normal. Slight haziness in the mid and upper lung zones may represent mild pulmonary edema. No effusions. No pneumothorax. IMPRESSION: 1. Endotracheal tube in good position. 2. Hazy infiltrates in the mid and upper lung zones may represent mild pulmonary edema. Aspiration pneumonitis could also give this appearance. Electronically Signed   By: Lorriane Shire M.D.   On: 11/23/2019 17:24   DG Abd 1 View  Result Date: 11/23/2019 CLINICAL DATA:  Central line and OG placement EXAM: ABDOMEN - 1 VIEW COMPARISON:  Same day chest radiograph FINDINGS: Pacer pads overlie the lower chest. Telemetry leads in monitoring devices also project over the upper abdomen and lower chest. The tip of a transesophageal tube terminates near the level of the gastric antrum with the side port beyond the GE junction. Hazy opacities in the lungs are better seen on dedicated chest radiograph performed concurrently. Coarse radiodensities projecting over both renal shadows worrisome for urolithiasis if this patient has not recently received contrast media. Degenerative changes present the spine. No high-grade obstructive bowel gas pattern. IMPRESSION: 1. Transesophageal tube tip is near the level of the gastric antrum with the side port beyond the GE junction. 2. No high-grade obstructive bowel gas pattern. 3. Coarse density over both renal shadows could  reflect urolithiasis if this patient has not recently received contrast media. Electronically Signed   By: Lovena Le M.D.   On: 11/23/2019 21:51   CT Head Wo Contrast  Result Date: 11/23/2019 CLINICAL DATA:  Head trauma. Cardiac arrest. EXAM: CT HEAD WITHOUT CONTRAST CT CERVICAL SPINE WITHOUT CONTRAST TECHNIQUE: Multidetector CT imaging of the head and cervical spine was performed following the standard  protocol without intravenous contrast. Multiplanar CT image reconstructions of the cervical spine were also generated. COMPARISON:  None. FINDINGS: CT HEAD FINDINGS Brain: No evidence of acute infarction, hemorrhage, hydrocephalus, extra-axial collection or mass lesion/mass effect. Vascular: No hyperdense vessel or unexpected calcification. Skull: Normal. Negative for fracture or focal lesion. Sinuses/Orbits: No acute finding. Evidence of previous maxillary sinus surgery. Slight mucosal thickening in the ethmoid air cells. Other: None CT CERVICAL SPINE FINDINGS Alignment: Normal. Skull base and vertebrae: No acute fracture. No primary bone lesion or focal pathologic process. Soft tissues and spinal canal: No prevertebral fluid or swelling. No visible canal hematoma. Disc levels: There are no disc protrusions or significant disc bulges in the cervical spine. There is no spinal stenosis. There is moderate left foraminal stenosis at C6-7 due to facet arthritis. The neural foramina throughout the remainder of the cervical spine are widely patent. Moderate right facet arthritis at C3-4. Upper chest: Focal area of infiltrate at the right lung apex posteriorly. Patchy haziness in both lung apices. Endotracheal tube in place. Other: None IMPRESSION: 1. Normal CT scan of the head. 2. No significant abnormality of the cervical spine. Moderate left foraminal stenosis at C6-7. Electronically Signed   By: Lorriane Shire M.D.   On: 11/23/2019 17:30   CT Cervical Spine Wo Contrast  Result Date: 11/23/2019 CLINICAL  DATA:  Head trauma. Cardiac arrest. EXAM: CT HEAD WITHOUT CONTRAST CT CERVICAL SPINE WITHOUT CONTRAST TECHNIQUE: Multidetector CT imaging of the head and cervical spine was performed following the standard protocol without intravenous contrast. Multiplanar CT image reconstructions of the cervical spine were also generated. COMPARISON:  None. FINDINGS: CT HEAD FINDINGS Brain: No evidence of acute infarction, hemorrhage, hydrocephalus, extra-axial collection or mass lesion/mass effect. Vascular: No hyperdense vessel or unexpected calcification. Skull: Normal. Negative for fracture or focal lesion. Sinuses/Orbits: No acute finding. Evidence of previous maxillary sinus surgery. Slight mucosal thickening in the ethmoid air cells. Other: None CT CERVICAL SPINE FINDINGS Alignment: Normal. Skull base and vertebrae: No acute fracture. No primary bone lesion or focal pathologic process. Soft tissues and spinal canal: No prevertebral fluid or swelling. No visible canal hematoma. Disc levels: There are no disc protrusions or significant disc bulges in the cervical spine. There is no spinal stenosis. There is moderate left foraminal stenosis at C6-7 due to facet arthritis. The neural foramina throughout the remainder of the cervical spine are widely patent. Moderate right facet arthritis at C3-4. Upper chest: Focal area of infiltrate at the right lung apex posteriorly. Patchy haziness in both lung apices. Endotracheal tube in place. Other: None IMPRESSION: 1. Normal CT scan of the head. 2. No significant abnormality of the cervical spine. Moderate left foraminal stenosis at C6-7. Electronically Signed   By: Lorriane Shire M.D.   On: 11/23/2019 17:30   CARDIAC CATHETERIZATION  Result Date: 11/23/2019  Prox LAD lesion is 50% stenosed.  Prox RCA lesion is 100% stenosed. Infarct-related artery TIMI 0 flow  Dist Cx lesion is 90% stenosed.  Normal left ventricular function ejection fraction between 50 and 55%  Conclusion  STEMI presentation 100% proximal RCA TIMI 0 flow Successful PCI and stent with DES 3.0 x 26 mm resolute Onyx to 13 atm to proximal RCA restoring TIMI-3 flow Left main free of disease LAD large with a proximal 50% lesion Circumflex very large with a distal 90% distal circumflex lesion Mixed dominant system Preserved left ventricular function of 50 to 55% Circumflex lesion intervention was deferred   DG Chest [facility] 1 [address]  Result Date: 11/30/2019 CLINICAL DATA:  Pulmonary disease. EXAM: PORTABLE CHEST 1 VIEW COMPARISON:  Radiograph yesterday. FINDINGS: Endotracheal tube is 3.3 cm from the carina. Enteric tube tip below the diaphragm not included in the field of view. Right internal jugular central venous catheter tip projects over the upper SVC. Hazy bilateral lung base opacities likely combination of pleural fluid and atelectasis/airspace disease, unchanged. There is vascular congestion. Heart is normal in size with unchanged mediastinal contours allowing for rotation. No pneumothorax. IMPRESSION: 1. Vascular congestion with unchanged hazy lung base opacities, likely combination of pleural effusions and atelectasis/airspace disease. 2. Stable support apparatus. Electronically Signed   By: Keith Rake M.D.   On: 11/30/2019 03:14   DG Chest Port 1 View  Result Date: 11/29/2019 CLINICAL DATA:  Acute respiratory failure. EXAM: PORTABLE CHEST 1 VIEW COMPARISON:  November 27, 2019. FINDINGS: Stable cardiomediastinal silhouette. Endotracheal and nasogastric tubes are unchanged in position. No pneumothorax is noted. Increased bibasilar opacities are noted concerning for worsening atelectasis or edema with small right pleural effusion. Bony thorax is unremarkable. IMPRESSION: Stable support apparatus. Increased bibasilar opacities are noted concerning for worsening atelectasis or edema with small right pleural effusion. No pneumothorax is noted. Electronically Signed   By: Marijo Conception M.D.   On: 11/29/2019  07:58   DG Chest Port 1 View  Result Date: 11/27/2019 CLINICAL DATA:  Acute respiratory failure. EXAM: PORTABLE CHEST 1 VIEW COMPARISON:  Chest x-ray 11/23/2019 FINDINGS: The endotracheal tube is 2.7 cm above the carina. The NG tube is coursing down the esophagus and into the stomach. The right IJ central venous catheter is stable with its tip in the mid SVC. The cardiac silhouette, mediastinal and hilar contours are within normal limits and stable. The external pacer paddles have been removed. Slight improved lung aeration. There is persistent interstitial prominence which could reflect mild edema but no infiltrates or effusions. IMPRESSION: 1. Support apparatus in good position, unchanged. 2. Interstitial prominence could suggest mild edema but overall improved aeration. Electronically Signed   By: Marijo Sanes M.D.   On: 11/27/2019 07:16   DG Chest Port 1 View  Result Date: 11/23/2019 CLINICAL DATA:  Central line placement and OG placement EXAM: PORTABLE CHEST 1 VIEW COMPARISON:  Radiograph 11/23/2019 FINDINGS: Endotracheal tube terminates in the mid trachea, 3 cm from the carina. A transesophageal tube tip and side port distal to the GE junction, terminating about the level of the gastric antrum. A right IJ catheter tip is present in the mid to upper SVC. Overlying pacer pads are noted. Telemetry wires project over lower chest as well. Hazy opacities in the mid to upper lungs are similar to prior. No pneumothorax. No effusion. The cardiomediastinal contours are unremarkable. Coarse calcifications noted upper pole left kidney. IMPRESSION: 1. Endotracheal tube 3 cm from the carina. 2. A transesophageal tube tip and side port are distal to the GE junction. 3. Right IJ catheter tip in the mid to upper SVC. 4. Stable hazy opacities in the mid to upper lungs bilaterally. 5. Left upper pole renal calcification. Electronically Signed   By: Lovena Le M.D.   On: 11/23/2019 21:48   EEG adult  Result Date:  11/27/2019 Alexis Goodell, MD     11/27/2019  3:10 PM ELECTROENCEPHALOGRAM REPORT Patient: Eean Buss       Room #: IC07A-AA EEG No. ID: [id number] Age: 64 y.o.        Sex: male Referring Physician: Kasa Report Date:  11/27/2019  Interpreting Physician: Alexis Goodell History: Osby Sweetin is an 64 y.o. male s/p arrest Medications: Propofol, Fentanyl, Zosyn, Brilinta, Crestor, Insulin, ASA Conditions of Recording:  This is a 21 channel routine scalp EEG performed with bipolar and monopolar montages arranged in accordance to the international 10/20 system of electrode placement. One channel was dedicated to EKG recording. The patient is in the intubated and sedated state. Description:  The waking background activity is discontinuous.  It consists of intermittent, generalized periods of attenuation lasting up to 15 seconds alternating with periods of higher voltage, disorganized activity.  This disorganized burst activity has an underlying slow rhythm consisting of polymorphic delta activity with superimposed faster rhythms. The patient received noxious stimulation with no activation of the background noted.  Hyperventilation and iIntermittent photic stimulation were not performed. IMPRESSION: This is an abnormal EEG due to a burst-suppression pattern seen throughout the tracing.  Burst suppression pattern can be seen in a variety of circumstances, including anesthesia (medicatoin effect), drug intoxication, hypothermia, as well as cerebral anoxia.  Clinical/neurological, and radiographic correlation advised.  Alexis Goodell, MD Neurology [phone] 11/27/2019, 2:58 PM   ECHOCARDIOGRAM COMPLETE  Result Date: 11/24/2019   ECHOCARDIOGRAM REPORT   Patient Name:   Kurt [name] Date of Exam: 11/24/2019 Medical Rec #:  [id number]      Height:       69.0 in Accession #:    [id number]     Weight:       196.4 lb Date of Birth:  [birth year]      BSA:          2.05 m Patient Age:    41 years       BP:            125/95 mmHg Patient Gender: M              HR:           52 bpm. Exam Location:  [facility] Procedure: 2D Echo and Intracardiac Opacification Agent Indications:     Cardiac Arrest  History:         Patient has no prior history of Echocardiogram examinations.                  CAD, COPD; Risk Factors:Cocaine Abuse.  Sonographer:     LTM Referring Phys:  [id number] [name] Diagnosing Phys: Yolonda Kida MD  Sonographer Comments: Echo performed with patient supine and on artificial respirator, no parasternal window and no apical window. Image acquisition challenging due to COPD. IMPRESSIONS  1. Left ventricular ejection fraction, by visual estimation, is 50 to 55%. The left ventricle has normal function. There is no left ventricular hypertrophy.  2. Definity contrast agent was given IV to delineate the left ventricular endocardial borders.  3. The left ventricle has no regional wall motion abnormalities.  4. Akinetic right ventricular.  5. Global right ventricle has severely reduced systolic function.The right ventricular size is moderately enlarged. No increase in right ventricular wall thickness.  6. Left atrial size was normal.  7. Right atrial size was normal.  8. The mitral valve is normal in structure. No evidence of mitral valve regurgitation.  9. The tricuspid valve is normal in structure. 10. The aortic valve is normal in structure. Aortic valve regurgitation is not visualized. 11. The pulmonic valve was not well visualized. Pulmonic valve regurgitation is not visualized. 12. Normal pulmonary artery systolic pressure. 13. Left ventricular ejection fraction by PLAX is is 63 % FINDINGS  Left  Ventricle: Left ventricular ejection fraction, by visual estimation, is 50 to 55%. Left ventricular ejection fraction by PLAX is is 63 % The left ventricle has normal function. Definity contrast agent was given IV to delineate the left ventricular endocardial borders. The left ventricle has no regional wall motion  abnormalities. There is no left ventricular hypertrophy. Right Ventricle: The right ventricular size is moderately enlarged. No increase in right ventricular wall thickness. Global RV systolic function is has severely reduced systolic function. The tricuspid regurgitant velocity is 1.61 m/s, and with an assumed right atrial pressure of 10 mmHg, the estimated right ventricular systolic pressure is normal at 20.4 mmHg. The motion of the right ventricle akinetic. Left Atrium: Left atrial size was normal in size. Right Atrium: Right atrial size was normal in size Pericardium: There is no evidence of pericardial effusion. Mitral Valve: The mitral valve is normal in structure. No evidence of mitral valve regurgitation. Tricuspid Valve: The tricuspid valve is normal in structure. Tricuspid valve regurgitation is trivial. Aortic Valve: The aortic valve is normal in structure. Aortic valve regurgitation is not visualized. Aortic valve mean gradient measures 2.0 mmHg. Aortic valve peak gradient measures 3.4 mmHg. Aortic valve area, by VTI measures 3.01 cm. Pulmonic Valve: The pulmonic valve was not well visualized. Pulmonic valve regurgitation is not visualized. Pulmonic regurgitation is not visualized. Aorta: The aortic root is normal in size and structure. IAS/Shunts: No atrial level shunt detected by color flow Doppler.  LEFT VENTRICLE PLAX 2D LV EF:         Left ventricular ejection fraction by PLAX is is 63 % LVIDd:         5.23 cm LVIDs:         4.19 cm LV PW:         0.88 cm LV IVS:        1.18 cm LVOT diam:     2.50 cm LV SV:         53 ml LV SV Index:   25.24 LVOT Area:     4.91 cm  LV Volumes (MOD) LV area d, A4C:    21.80 cm LV area s, A4C:    10.20 cm LV major d, A4C:   7.30 cm LV major s, A4C:   5.81 cm LV vol d, MOD A4C: 52.4 ml LV vol s, MOD A4C: 14.8 ml LV SV MOD A4C:     52.4 ml AORTIC VALVE AV Area (Vmax):    2.77 cm AV Area (Vmean):   2.48 cm AV Area (VTI):     3.01 cm AV Vmax:           91.60 cm/s  AV Vmean:          69.800 cm/s AV VTI:            0.137 m AV Peak Grad:      3.4 mmHg AV Mean Grad:      2.0 mmHg LVOT Vmax:         51.70 cm/s LVOT Vmean:        35.200 cm/s LVOT VTI:          0.084 m LVOT/AV VTI ratio: 0.61 MV E velocity: 39.80 cm/s 103 cm/s  TRICUSPID VALVE MV A velocity: 49.00 cm/s 70.3 cm/s TR Peak grad:   10.4 mmHg MV E/A ratio:  0.81       1.5       TR Vmax:        161.00 cm/s  SHUNTS                                     Systemic VTI:  0.08 m                                     Systemic Diam: 2.50 cm  Yolonda Kida MD Electronically signed by Yolonda Kida MD Signature Date/Time: 11/24/2019/11:08:46 AM    Final     Scheduled Meds: . aspirin  81 mg Oral Daily  . chlorhexidine gluconate (MEDLINE KIT)  15 mL Mouth Rinse BID  . Chlorhexidine Gluconate Cloth  6 each Topical Daily  . enoxaparin (LOVENOX) injection  40 mg Subcutaneous Q24H  . famotidine  20 mg Oral BID  . [START ON 12/05/2019] feeding supplement (ENSURE ENLIVE)  237 mL Oral BID BM  . [START ON 01/30/232] folic acid  1 mg Oral Daily  . ipratropium-albuterol  3 mL Nebulization Q4H  . lisinopril  5 mg Oral Daily  . methylPREDNISolone (SOLU-MEDROL) injection  40 mg Intravenous Q24H  . metoprolol tartrate  50 mg Oral BID  . [START ON 12/05/2019] multivitamin with minerals  1 tablet Oral Daily  . rosuvastatin  40 mg Oral q1800  . thiamine  100 mg Oral Daily  . ticagrelor  90 mg Oral BID   Continuous Infusions: . sodium chloride    . sodium chloride     PRN Meds:acetaminophen, acetaminophen, albuterol, atropine, glycopyrrolate, hydrALAZINE, LORazepam **OR** LORazepam, ondansetron (ZOFRAN) IV, sodium chloride flush  Assessment/Plan:  Severe ACUTE Hypoxic and Hypercapnic Respiratory Failure:  -Resolved now -On Solu-Medrol may change it to tapered prednisone dose S/p extubation As needed oxygen as needed Status post Lasix -Tinea to monitor  Acute MI: Recovering.   Primary PCI with DES RCA with preserved left ventricular function on dual antiplatelet therapy Cocaine and CAD recent history Follow up Cardiology recs: Continue dual antiplatelet therapy uninterrupted for 1 year, continue statin, Lopressor 50 mg twice daily and lisinopril 5 mg daily -Appreciate cardiology recommendation and input -Nodule rehab  Electrolyte imbalance: Found to be hypokalemic -Rest post replacement -To monitor labs  Drug abuse: Provided counseling -On benzodiazepine as needed with CIWA protocol  delirium from cardiogenic shock: Per record was near death -Recovery slowly -PT OT evaluation -Dietitian consult and follow recommendation  Lovenox DVT prophylaxis LOS: 11 days   Kurt [name]

## 2019-12-05 LAB — CBC WITH DIFFERENTIAL/PLATELET
Abs Immature Granulocytes: 0.12 K/uL — ABNORMAL HIGH (ref 0.00–0.07)
Basophils Absolute: 0 K/uL (ref 0.0–0.1)
Basophils Relative: 0 %
Eosinophils Absolute: 0.1 K/uL (ref 0.0–0.5)
Eosinophils Relative: 0 %
HCT: 36.2 % — ABNORMAL LOW (ref 39.0–52.0)
Hemoglobin: 11.7 g/dL — ABNORMAL LOW (ref 13.0–17.0)
Immature Granulocytes: 1 %
Lymphocytes Relative: 13 %
Lymphs Abs: 2.2 K/uL (ref 0.7–4.0)
MCH: 32.6 pg (ref 26.0–34.0)
MCHC: 32.3 g/dL (ref 30.0–36.0)
MCV: 100.8 fL — ABNORMAL HIGH (ref 80.0–100.0)
Monocytes Absolute: 2 K/uL — ABNORMAL HIGH (ref 0.1–1.0)
Monocytes Relative: 12 %
Neutro Abs: 12.2 K/uL — ABNORMAL HIGH (ref 1.7–7.7)
Neutrophils Relative %: 74 %
Platelets: 246 K/uL (ref 150–400)
RBC: 3.59 MIL/uL — ABNORMAL LOW (ref 4.22–5.81)
RDW: 13 % (ref 11.5–15.5)
WBC: 16.6 K/uL — ABNORMAL HIGH (ref 4.0–10.5)
nRBC: 0 % (ref 0.0–0.2)

## 2019-12-05 LAB — BASIC METABOLIC PANEL
Anion gap: 9 (ref 5–15)
BUN: 21 mg/dL (ref 8–23)
CO2: 22 mmol/L (ref 22–32)
Calcium: 8.1 mg/dL — ABNORMAL LOW (ref 8.9–10.3)
Chloride: 112 mmol/L — ABNORMAL HIGH (ref 98–111)
Creatinine, Ser: 0.74 mg/dL (ref 0.61–1.24)
GFR calc Af Amer: >60 mL/min (ref >60)
GFR calc non Af Amer: >60 mL/min (ref >60)
Glucose, Bld: 107 mg/dL — ABNORMAL HIGH (ref 70–99)
Potassium: 3.1 mmol/L — ABNORMAL LOW (ref 3.5–5.1)
Sodium: 143 mmol/L (ref 135–145)

## 2019-12-05 LAB — GLUCOSE, CAPILLARY
Glucose-Capillary: 65 mg/dL — ABNORMAL LOW (ref 70–99)
Glucose-Capillary: 63 mg/dL — ABNORMAL LOW (ref 70–99)
Glucose-Capillary: 88 mg/dL (ref 70–99)
Glucose-Capillary: 204 mg/dL — ABNORMAL HIGH (ref 70–99)

## 2019-12-05 LAB — MAGNESIUM: Magnesium: 1.8 mg/dL (ref 1.7–2.4)

## 2019-12-05 MED ORDER — MAGNESIUM SULFATE 2 GM/50ML IV SOLN
2.0000 g | Freq: Once | INTRAVENOUS | Status: AC
  Administered 2019-12-05: 2 g via INTRAVENOUS
  Filled 2019-12-05: qty 50

## 2019-12-05 MED ORDER — DEXTROSE 50 % IV SOLN
INTRAVENOUS | Status: AC
  Administered 2019-12-05: 50 mL
  Filled 2019-12-05: qty 50

## 2019-12-05 MED ORDER — PREDNISONE 20 MG PO TABS
40.0000 mg | ORAL_TABLET | Freq: Every day | ORAL | Status: DC
  Administered 2019-12-06 – 2019-12-08 (×3): 40 mg via ORAL
  Filled 2019-12-05 (×3): qty 2

## 2019-12-05 MED ORDER — IPRATROPIUM-ALBUTEROL 0.5-2.5 (3) MG/3ML IN SOLN
3.0000 mL | Freq: Four times a day (QID) | RESPIRATORY_TRACT | Status: DC
  Administered 2019-12-05 – 2019-12-07 (×11): 3 mL via RESPIRATORY_TRACT
  Filled 2019-12-05 (×11): qty 3

## 2019-12-05 MED ORDER — POTASSIUM CHLORIDE CRYS ER 20 MEQ PO TBCR
40.0000 meq | EXTENDED_RELEASE_TABLET | Freq: Two times a day (BID) | ORAL | Status: AC
  Administered 2019-12-05 (×2): 40 meq via ORAL
  Filled 2019-12-05 (×2): qty 2

## 2019-12-05 NOTE — Progress Notes (Signed)
Subjective: Alert and oriented to times place and person this morning.  Says he is feeling better.  Objective: Vital signs in last 24 hours: Temp:  [99.9 F (37.7 C)-100.8 F (38.2 C)] 100 F (37.8 C) (01/05 0700) Pulse Rate:  [67-99] 85 (01/05 1100) Resp:  [15-32] 28 (01/05 1100) BP: (130-158)/(73-103) 132/81 (01/05 1100) SpO2:  [91 %-100 %] 97 % (01/05 1100) Weight:  [83.9 kg] 83.9 kg (01/05 0500)  Intake/Output from previous day: 01/04 0701 - 01/05 0700 In: 240 [P.O.:240] Out: 1250 [Urine:1250] Intake/Output this shift: No intake/output data recorded.  GENERAL: Appears stable, alert and oriented fully; no agitation  HEAD: Normocephalic, atraumatic.  EYES: Pupils equal, round, reactive to light.  No scleral icterus.  MOUTH: Moist mucosal membrane. NECK: Supple. No thyromegaly. No nodules. No JVD.  PULMONARY: Mostly clear some coarse breathing bibasilar CARDIOVASCULAR: S1 and S2. Regular rate and rhythm. No murmurs, rubs, or gallops.  GASTROINTESTINAL: Soft, nontender, -distended. Positive bowel sounds.  MUSCULOSKELETAL: No swelling, clubbing, or edema. Generalized ext weakness  NEUROLOGIC: A+OX3; ext has generalized weakness 3/4; sensation intact; CNII-XII grossly intact  SKIN:intact,warm,dry  Results for orders placed or performed during the hospital encounter of 11/23/19 (from the past 24 hour(s))  Glucose, capillary     Status: Abnormal   Collection Time: 12/04/19  3:45 PM  Result Value Ref Range   Glucose-Capillary 125 (H) 70 - 99 mg/dL  Glucose, capillary     Status: Abnormal   Collection Time: 12/04/19  7:29 PM  Result Value Ref Range   Glucose-Capillary 104 (H) 70 - 99 mg/dL  Glucose, capillary     Status: None   Collection Time: 12/04/19 11:54 PM  Result Value Ref Range   Glucose-Capillary 83 70 - 99 mg/dL  Glucose, capillary     Status: Abnormal   Collection Time: 12/05/19  4:08 AM  Result Value Ref Range   Glucose-Capillary 65 (L) 70 - 99 mg/dL  CBC with  Differential/Platelet     Status: Abnormal   Collection Time: 12/05/19  5:36 AM  Result Value Ref Range   WBC 16.6 (H) 4.0 - 10.5 K/uL   RBC 3.59 (L) 4.22 - 5.81 MIL/uL   Hemoglobin 11.7 (L) 13.0 - 17.0 g/dL   HCT 36.2 (L) 39.0 - 52.0 %   MCV 100.8 (H) 80.0 - 100.0 fL   MCH 32.6 26.0 - 34.0 pg   MCHC 32.3 30.0 - 36.0 g/dL   RDW 13.0 11.5 - 15.5 %   Platelets 246 150 - 400 K/uL   nRBC 0.0 0.0 - 0.2 %   Neutrophils Relative % 74 %   Neutro Abs 12.2 (H) 1.7 - 7.7 K/uL   Lymphocytes Relative 13 %   Lymphs Abs 2.2 0.7 - 4.0 K/uL   Monocytes Relative 12 %   Monocytes Absolute 2.0 (H) 0.1 - 1.0 K/uL   Eosinophils Relative 0 %   Eosinophils Absolute 0.1 0.0 - 0.5 K/uL   Basophils Relative 0 %   Basophils Absolute 0.0 0.0 - 0.1 K/uL   Immature Granulocytes 1 %   Abs Immature Granulocytes 0.12 (H) 0.00 - 0.07 K/uL  Glucose, capillary     Status: Abnormal   Collection Time: 12/05/19  8:10 AM  Result Value Ref Range   Glucose-Capillary 63 (L) 70 - 99 mg/dL  Basic metabolic panel     Status: Abnormal   Collection Time: 12/05/19 10:24 AM  Result Value Ref Range   Sodium 143 135 - 145 mmol/L   Potassium 3.1 (  L) 3.5 - 5.1 mmol/L   Chloride 112 (H) 98 - 111 mmol/L   CO2 22 22 - 32 mmol/L   Glucose, Bld 107 (H) 70 - 99 mg/dL   BUN 21 8 - 23 mg/dL   Creatinine, Ser 0.74 0.61 - 1.24 mg/dL   Calcium 8.1 (L) 8.9 - 10.3 mg/dL   GFR calc non Af Amer >60 >60 mL/min   GFR calc Af Amer >60 >60 mL/min   Anion gap 9 5 - 15  Magnesium     Status: None   Collection Time: 12/05/19 10:24 AM  Result Value Ref Range   Magnesium 1.8 1.7 - 2.4 mg/dL  Glucose, capillary     Status: None   Collection Time: 12/05/19 11:44 AM  Result Value Ref Range   Glucose-Capillary 88 70 - 99 mg/dL    Studies/Results: DG Chest 1 View  Result Date: 11/23/2019 CLINICAL DATA:  Intubation. Cardiac arrest. EXAM: CHEST  1 VIEW COMPARISON:  03/16/2018 FINDINGS: Endotracheal tube is in good position 3 cm above the  carina. Heart size and pulmonary vascularity are normal. Slight haziness in the mid and upper lung zones may represent mild pulmonary edema. No effusions. No pneumothorax. IMPRESSION: 1. Endotracheal tube in good position. 2. Hazy infiltrates in the mid and upper lung zones may represent mild pulmonary edema. Aspiration pneumonitis could also give this appearance. Electronically Signed   By: Lorriane Shire M.D.   On: 11/23/2019 17:24   DG Abd 1 View  Result Date: 11/23/2019 CLINICAL DATA:  Central line and OG placement EXAM: ABDOMEN - 1 VIEW COMPARISON:  Same day chest radiograph FINDINGS: Pacer pads overlie the lower chest. Telemetry leads in monitoring devices also project over the upper abdomen and lower chest. The tip of a transesophageal tube terminates near the level of the gastric antrum with the side port beyond the GE junction. Hazy opacities in the lungs are better seen on dedicated chest radiograph performed concurrently. Coarse radiodensities projecting over both renal shadows worrisome for urolithiasis if this patient has not recently received contrast media. Degenerative changes present the spine. No high-grade obstructive bowel gas pattern. IMPRESSION: 1. Transesophageal tube tip is near the level of the gastric antrum with the side port beyond the GE junction. 2. No high-grade obstructive bowel gas pattern. 3. Coarse density over both renal shadows could reflect urolithiasis if this patient has not recently received contrast media. Electronically Signed   By: Lovena Le M.D.   On: 11/23/2019 21:51   CT Head Wo Contrast  Result Date: 11/23/2019 CLINICAL DATA:  Head trauma. Cardiac arrest. EXAM: CT HEAD WITHOUT CONTRAST CT CERVICAL SPINE WITHOUT CONTRAST TECHNIQUE: Multidetector CT imaging of the head and cervical spine was performed following the standard protocol without intravenous contrast. Multiplanar CT image reconstructions of the cervical spine were also generated. COMPARISON:   None. FINDINGS: CT HEAD FINDINGS Brain: No evidence of acute infarction, hemorrhage, hydrocephalus, extra-axial collection or mass lesion/mass effect. Vascular: No hyperdense vessel or unexpected calcification. Skull: Normal. Negative for fracture or focal lesion. Sinuses/Orbits: No acute finding. Evidence of previous maxillary sinus surgery. Slight mucosal thickening in the ethmoid air cells. Other: None CT CERVICAL SPINE FINDINGS Alignment: Normal. Skull base and vertebrae: No acute fracture. No primary bone lesion or focal pathologic process. Soft tissues and spinal canal: No prevertebral fluid or swelling. No visible canal hematoma. Disc levels: There are no disc protrusions or significant disc bulges in the cervical spine. There is no spinal stenosis. There is moderate left foraminal  stenosis at C6-7 due to facet arthritis. The neural foramina throughout the remainder of the cervical spine are widely patent. Moderate right facet arthritis at C3-4. Upper chest: Focal area of infiltrate at the right lung apex posteriorly. Patchy haziness in both lung apices. Endotracheal tube in place. Other: None IMPRESSION: 1. Normal CT scan of the head. 2. No significant abnormality of the cervical spine. Moderate left foraminal stenosis at C6-7. Electronically Signed   By: Lorriane Shire M.D.   On: 11/23/2019 17:30   CT Cervical Spine Wo Contrast  Result Date: 11/23/2019 CLINICAL DATA:  Head trauma. Cardiac arrest. EXAM: CT HEAD WITHOUT CONTRAST CT CERVICAL SPINE WITHOUT CONTRAST TECHNIQUE: Multidetector CT imaging of the head and cervical spine was performed following the standard protocol without intravenous contrast. Multiplanar CT image reconstructions of the cervical spine were also generated. COMPARISON:  None. FINDINGS: CT HEAD FINDINGS Brain: No evidence of acute infarction, hemorrhage, hydrocephalus, extra-axial collection or mass lesion/mass effect. Vascular: No hyperdense vessel or unexpected calcification.  Skull: Normal. Negative for fracture or focal lesion. Sinuses/Orbits: No acute finding. Evidence of previous maxillary sinus surgery. Slight mucosal thickening in the ethmoid air cells. Other: None CT CERVICAL SPINE FINDINGS Alignment: Normal. Skull base and vertebrae: No acute fracture. No primary bone lesion or focal pathologic process. Soft tissues and spinal canal: No prevertebral fluid or swelling. No visible canal hematoma. Disc levels: There are no disc protrusions or significant disc bulges in the cervical spine. There is no spinal stenosis. There is moderate left foraminal stenosis at C6-7 due to facet arthritis. The neural foramina throughout the remainder of the cervical spine are widely patent. Moderate right facet arthritis at C3-4. Upper chest: Focal area of infiltrate at the right lung apex posteriorly. Patchy haziness in both lung apices. Endotracheal tube in place. Other: None IMPRESSION: 1. Normal CT scan of the head. 2. No significant abnormality of the cervical spine. Moderate left foraminal stenosis at C6-7. Electronically Signed   By: Lorriane Shire M.D.   On: 11/23/2019 17:30   CARDIAC CATHETERIZATION  Result Date: 11/23/2019  Prox LAD lesion is 50% stenosed.  Prox RCA lesion is 100% stenosed. Infarct-related artery TIMI 0 flow  Dist Cx lesion is 90% stenosed.  Normal left ventricular function ejection fraction between 50 and 55%  Conclusion STEMI presentation 100% proximal RCA TIMI 0 flow Successful PCI and stent with DES 3.0 x 26 mm resolute Onyx to 13 atm to proximal RCA restoring TIMI-3 flow Left main free of disease LAD large with a proximal 50% lesion Circumflex very large with a distal 90% distal circumflex lesion Mixed dominant system Preserved left ventricular function of 50 to 55% Circumflex lesion intervention was deferred   DG Chest Port 1 View  Result Date: 11/30/2019 CLINICAL DATA:  Pulmonary disease. EXAM: PORTABLE CHEST 1 VIEW COMPARISON:  Radiograph yesterday.  FINDINGS: Endotracheal tube is 3.3 cm from the carina. Enteric tube tip below the diaphragm not included in the field of view. Right internal jugular central venous catheter tip projects over the upper SVC. Hazy bilateral lung base opacities likely combination of pleural fluid and atelectasis/airspace disease, unchanged. There is vascular congestion. Heart is normal in size with unchanged mediastinal contours allowing for rotation. No pneumothorax. IMPRESSION: 1. Vascular congestion with unchanged hazy lung base opacities, likely combination of pleural effusions and atelectasis/airspace disease. 2. Stable support apparatus. Electronically Signed   By: Keith Rake M.D.   On: 11/30/2019 03:14   DG Chest Port 1 View  Result Date: 11/29/2019  CLINICAL DATA:  Acute respiratory failure. EXAM: PORTABLE CHEST 1 VIEW COMPARISON:  November 27, 2019. FINDINGS: Stable cardiomediastinal silhouette. Endotracheal and nasogastric tubes are unchanged in position. No pneumothorax is noted. Increased bibasilar opacities are noted concerning for worsening atelectasis or edema with small right pleural effusion. Bony thorax is unremarkable. IMPRESSION: Stable support apparatus. Increased bibasilar opacities are noted concerning for worsening atelectasis or edema with small right pleural effusion. No pneumothorax is noted. Electronically Signed   By: Marijo Conception M.D.   On: 11/29/2019 07:58   DG Chest Port 1 View  Result Date: 11/27/2019 CLINICAL DATA:  Acute respiratory failure. EXAM: PORTABLE CHEST 1 VIEW COMPARISON:  Chest x-ray 11/23/2019 FINDINGS: The endotracheal tube is 2.7 cm above the carina. The NG tube is coursing down the esophagus and into the stomach. The right IJ central venous catheter is stable with its tip in the mid SVC. The cardiac silhouette, mediastinal and hilar contours are within normal limits and stable. The external pacer paddles have been removed. Slight improved lung aeration. There is  persistent interstitial prominence which could reflect mild edema but no infiltrates or effusions. IMPRESSION: 1. Support apparatus in good position, unchanged. 2. Interstitial prominence could suggest mild edema but overall improved aeration. Electronically Signed   By: Marijo Sanes M.D.   On: 11/27/2019 07:16   DG Chest Port 1 View  Result Date: 11/23/2019 CLINICAL DATA:  Central line placement and OG placement EXAM: PORTABLE CHEST 1 VIEW COMPARISON:  Radiograph 11/23/2019 FINDINGS: Endotracheal tube terminates in the mid trachea, 3 cm from the carina. A transesophageal tube tip and side port distal to the GE junction, terminating about the level of the gastric antrum. A right IJ catheter tip is present in the mid to upper SVC. Overlying pacer pads are noted. Telemetry wires project over lower chest as well. Hazy opacities in the mid to upper lungs are similar to prior. No pneumothorax. No effusion. The cardiomediastinal contours are unremarkable. Coarse calcifications noted upper pole left kidney. IMPRESSION: 1. Endotracheal tube 3 cm from the carina. 2. A transesophageal tube tip and side port are distal to the GE junction. 3. Right IJ catheter tip in the mid to upper SVC. 4. Stable hazy opacities in the mid to upper lungs bilaterally. 5. Left upper pole renal calcification. Electronically Signed   By: Lovena Le M.D.   On: 11/23/2019 21:48   EEG adult  Result Date: 11/27/2019 Alexis Goodell, MD     11/27/2019  3:10 PM ELECTROENCEPHALOGRAM REPORT Patient: Kurt [name]       Room #: IC07A-AA EEG No. ID: [id number] Age: 64 y.o.        Sex: male Referring Physician: Kasa Report Date:  11/27/2019       Interpreting Physician: Alexis Goodell History: Gergory Biello is an 64 y.o. male s/p arrest Medications: Propofol, Fentanyl, Zosyn, Brilinta, Crestor, Insulin, ASA Conditions of Recording:  This is a 21 channel routine scalp EEG performed with bipolar and monopolar montages arranged in accordance to  the international 10/20 system of electrode placement. One channel was dedicated to EKG recording. The patient is in the intubated and sedated state. Description:  The waking background activity is discontinuous.  It consists of intermittent, generalized periods of attenuation lasting up to 15 seconds alternating with periods of higher voltage, disorganized activity.  This disorganized burst activity has an underlying slow rhythm consisting of polymorphic delta activity with superimposed faster rhythms. The patient received noxious stimulation with no activation of the background noted.  Hyperventilation and iIntermittent photic stimulation were not performed. IMPRESSION: This is an abnormal EEG due to a burst-suppression pattern seen throughout the tracing.  Burst suppression pattern can be seen in a variety of circumstances, including anesthesia (medicatoin effect), drug intoxication, hypothermia, as well as cerebral anoxia.  Clinical/neurological, and radiographic correlation advised.  Alexis Goodell, MD Neurology [phone] 11/27/2019, 2:58 PM   ECHOCARDIOGRAM COMPLETE  Result Date: 11/24/2019   ECHOCARDIOGRAM REPORT   Patient Name:   GARVIN ELLENA Date of Exam: 11/24/2019 Medical Rec #:  [id number]      Height:       69.0 in Accession #:    [id number]     Weight:       196.4 lb Date of Birth:  Oct 08, 1956      BSA:          2.05 m Patient Age:    60 years       BP:           125/95 mmHg Patient Gender: M              HR:           52 bpm. Exam Location:  [facility] Procedure: 2D Echo and Intracardiac Opacification Agent Indications:     Cardiac Arrest  History:         Patient has no prior history of Echocardiogram examinations.                  CAD, COPD; Risk Factors:Cocaine Abuse.  Sonographer:     LTM Referring Phys:  [id number] [name] Diagnosing Phys: Yolonda Kida MD  Sonographer Comments: Echo performed with patient supine and on artificial respirator, no parasternal window and no apical  window. Image acquisition challenging due to COPD. IMPRESSIONS  1. Left ventricular ejection fraction, by visual estimation, is 50 to 55%. The left ventricle has normal function. There is no left ventricular hypertrophy.  2. Definity contrast agent was given IV to delineate the left ventricular endocardial borders.  3. The left ventricle has no regional wall motion abnormalities.  4. Akinetic right ventricular.  5. Global right ventricle has severely reduced systolic function.The right ventricular size is moderately enlarged. No increase in right ventricular wall thickness.  6. Left atrial size was normal.  7. Right atrial size was normal.  8. The mitral valve is normal in structure. No evidence of mitral valve regurgitation.  9. The tricuspid valve is normal in structure. 10. The aortic valve is normal in structure. Aortic valve regurgitation is not visualized. 11. The pulmonic valve was not well visualized. Pulmonic valve regurgitation is not visualized. 12. Normal pulmonary artery systolic pressure. 13. Left ventricular ejection fraction by PLAX is is 63 % FINDINGS  Left Ventricle: Left ventricular ejection fraction, by visual estimation, is 50 to 55%. Left ventricular ejection fraction by PLAX is is 63 % The left ventricle has normal function. Definity contrast agent was given IV to delineate the left ventricular endocardial borders. The left ventricle has no regional wall motion abnormalities. There is no left ventricular hypertrophy. Right Ventricle: The right ventricular size is moderately enlarged. No increase in right ventricular wall thickness. Global RV systolic function is has severely reduced systolic function. The tricuspid regurgitant velocity is 1.61 m/s, and with an assumed right atrial pressure of 10 mmHg, the estimated right ventricular systolic pressure is normal at 20.4 mmHg. The motion of the right ventricle akinetic. Left Atrium: Left atrial size was normal in size. Right Atrium: Right  atrial  size was normal in size Pericardium: There is no evidence of pericardial effusion. Mitral Valve: The mitral valve is normal in structure. No evidence of mitral valve regurgitation. Tricuspid Valve: The tricuspid valve is normal in structure. Tricuspid valve regurgitation is trivial. Aortic Valve: The aortic valve is normal in structure. Aortic valve regurgitation is not visualized. Aortic valve mean gradient measures 2.0 mmHg. Aortic valve peak gradient measures 3.4 mmHg. Aortic valve area, by VTI measures 3.01 cm. Pulmonic Valve: The pulmonic valve was not well visualized. Pulmonic valve regurgitation is not visualized. Pulmonic regurgitation is not visualized. Aorta: The aortic root is normal in size and structure. IAS/Shunts: No atrial level shunt detected by color flow Doppler.  LEFT VENTRICLE PLAX 2D LV EF:         Left ventricular ejection fraction by PLAX is is 63 % LVIDd:         5.23 cm LVIDs:         4.19 cm LV PW:         0.88 cm LV IVS:        1.18 cm LVOT diam:     2.50 cm LV SV:         53 ml LV SV Index:   25.24 LVOT Area:     4.91 cm  LV Volumes (MOD) LV area d, A4C:    21.80 cm LV area s, A4C:    10.20 cm LV major d, A4C:   7.30 cm LV major s, A4C:   5.81 cm LV vol d, MOD A4C: 52.4 ml LV vol s, MOD A4C: 14.8 ml LV SV MOD A4C:     52.4 ml AORTIC VALVE AV Area (Vmax):    2.77 cm AV Area (Vmean):   2.48 cm AV Area (VTI):     3.01 cm AV Vmax:           91.60 cm/s AV Vmean:          69.800 cm/s AV VTI:            0.137 m AV Peak Grad:      3.4 mmHg AV Mean Grad:      2.0 mmHg LVOT Vmax:         51.70 cm/s LVOT Vmean:        35.200 cm/s LVOT VTI:          0.084 m LVOT/AV VTI ratio: 0.61 MV E velocity: 39.80 cm/s 103 cm/s  TRICUSPID VALVE MV A velocity: 49.00 cm/s 70.3 cm/s TR Peak grad:   10.4 mmHg MV E/A ratio:  0.81       1.5       TR Vmax:        161.00 cm/s                                      SHUNTS                                     Systemic VTI:  0.08 m                                      Systemic Diam: 2.50 cm  Yolonda Kida MD Electronically signed by Yolonda Kida MD  Signature Date/Time: 11/24/2019/11:08:46 AM    Final     Scheduled Meds: . aspirin  81 mg Oral Daily  . chlorhexidine gluconate (MEDLINE KIT)  15 mL Mouth Rinse BID  . Chlorhexidine Gluconate Cloth  6 each Topical Daily  . enoxaparin (LOVENOX) injection  40 mg Subcutaneous Q24H  . famotidine  20 mg Oral BID  . feeding supplement (ENSURE ENLIVE)  237 mL Oral BID BM  . folic acid  1 mg Oral Daily  . ipratropium-albuterol  3 mL Nebulization Q6H  . lisinopril  5 mg Oral Daily  . methylPREDNISolone (SOLU-MEDROL) injection  40 mg Intravenous Q24H  . metoprolol tartrate  50 mg Oral BID  . multivitamin with minerals  1 tablet Oral Daily  . potassium chloride  40 mEq Oral BID  . rosuvastatin  40 mg Oral q1800  . thiamine  100 mg Oral Daily  . ticagrelor  90 mg Oral BID   Continuous Infusions: . sodium chloride    . sodium chloride    . magnesium sulfate bolus IVPB     PRN Meds:acetaminophen, acetaminophen, albuterol, atropine, glycopyrrolate, hydrALAZINE, LORazepam **OR** LORazepam, ondansetron (ZOFRAN) IV, sodium chloride flush  Assessment/Plan:  Severe ACUTE Hypoxic and Hypercapnic Respiratory Failure:  -Resolved now - Has some scattered wheezing  - DuoNeb  -Weaning down to room air gradually; currently on 2 L -On Solu-Medrol may change it to tapered prednisone dose S/p extubation As needed oxygen as needed Status post Lasix - Cont to monitor in stepdown unit for now  Acute MI: Recovering.  Primary PCI with DES RCA with preserved left ventricular function on dual antiplatelet therapy Cocaine and CAD recent history Follow up Cardiology recs: Continue dual antiplatelet therapy uninterrupted for 1 year, continue statin, Lopressor 50 mg twice daily and lisinopril 5 mg daily -Appreciate cardiology recommendation and input -Cardiac rehabmay be helpful   Electrolyte imbalance: Found to be  hypokalemic and hypomagnesemic -Status post replacement -To monitor labs  Drug abuse: Provided counseling -On benzodiazepine as needed with CIWA protocol  delirium from cardiogenic shock: Per record was near death -Recovery slowly -PT OT evaluation -Dietitian consult and follow recommendation -May need SNF versus home health care PT and aide; to be decided  Lovenox DVT prophylaxis LOS: 12 days   Neola Worrall Izetta Dakin

## 2019-12-05 NOTE — Progress Notes (Signed)
Physical Therapy Treatment Patient Details Name: Kurt [name] MRN: [id number] DOB: 1956/03/22 Today's Date: 12/05/2019    History of Present Illness Pt is a 64 y.o. male found down on scene with lit cigarette.  Pt noted with cardiac arrest; intubated and s/p cath 12/24.  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    PT Comments    Pt very motivated to try and work with PT despite feeling extremely weak and needing regular brief rest breaks (often after just a few LE reps).  Pt on room air with sats staying in the high 90s, HR generally 70s t/o the session and never into the 90s.  He was able to do 2 separate standing bouts ~45 seconds and then ~15 seconds.  Pt with some knee buckling but with cuing to engage quads and use UEs effectively.   Pt with little systemic fatigue with activity, but muscles very weak and quick to fatigue with all acts.   Follow Up Recommendations  SNF     Equipment Recommendations  Rolling walker with 5" wheels;Wheelchair (measurements PT)    Recommendations for Other Services       Precautions / Restrictions Precautions Precautions: Fall Restrictions Weight Bearing Restrictions: No    Mobility  Bed Mobility Overal bed mobility: Needs Assistance Bed Mobility: Supine to Sit;Sit to Supine     Supine to sit: Mod assist Sit to supine: Max assist   General bed mobility comments: Great effort, needed more assist than he expected  Transfers Overall transfer level: Needs assistance Equipment used: Rolling walker (2 wheeled) Transfers: Sit to/from Stand Sit to Stand: Mod assist         General transfer comment: 2 standing bouts.  Pt needing assist each time, but once up able to maintain upright at times w/o assist  (close CGA) but generally needing min assist.  stood ~45 seconds on first attempt and then ~15 seconds after a few minutes of seated rest break.  Ambulation/Gait             General Gait Details: attempted to take a few side steps along EOB on first standing attempt - unable to keep knees from buckling while trying to [employer] either LE   Stairs             Wheelchair Mobility    Modified Rankin (Stroke Patients Only)       Balance Overall balance assessment: Needs assistance Sitting-balance support: Bilateral upper extremity supported;Feet supported Sitting balance-Leahy Scale: Fair     Standing balance support: Bilateral upper extremity supported Standing balance-Leahy Scale: Poor                              Cognition Arousal/Alertness: Awake/alert Behavior During Therapy: WFL for tasks assessed/performed Overall Cognitive Status: Within Functional Limits for tasks assessed                                        Exercises General Exercises - Lower Extremity Ankle Circles/Pumps: AROM;20 reps Quad Sets: Strengthening;10 reps;Both Heel Slides: AROM;10 reps;Both(with lightly resisted leg extensions) Hip ABduction/ADduction: Strengthening;10 reps;Both(very light resistance)    General Comments  Pertinent Vitals/Pain Pain Assessment: No/denies pain    Home Living                      Prior Function            PT Goals (current goals can now be found in the care plan section) Progress towards PT goals: Progressing toward goals    Frequency    Min 2X/week      PT Plan Current plan remains appropriate    Co-evaluation              AM-PAC PT "6 Clicks" Mobility   Outcome Measure  Help needed turning from your back to your side while in a flat bed without using bedrails?: A Lot Help needed moving from lying on your back to sitting on the side of a flat bed without using bedrails?: A Lot Help  needed moving to and from a bed to a chair (including a wheelchair)?: Total Help needed standing up from a chair using your arms (e.g., wheelchair or bedside chair)?: A Lot Help needed to walk in hospital room?: Total Help needed climbing 3-5 steps with a railing? : Total 6 Click Score: 9    End of Session Equipment Utilized During Treatment: Gait belt Activity Tolerance: Patient limited by fatigue Patient left: in bed;with call bell/phone within reach Nurse Communication: Mobility status PT Visit Diagnosis: Other abnormalities of gait and mobility (R26.89);Muscle weakness (generalized) (M62.81);Difficulty in walking, not elsewhere classified (R26.2)     Time: 1539-1610 PT Time Calculation (min) (ACUTE ONLY): 31 min  Charges:  $Therapeutic Exercise: 8-22 mins $Therapeutic Activity: 8-22 mins                     Kreg Shropshire, DPT 12/05/2019, 5:06 PM

## 2019-12-05 NOTE — Progress Notes (Signed)
[facility]    SUBJECTIVE: The patient reports that he is feeling better today. He states his breathing has improved and he denies chest pain. He states that he would like to go home.   Vitals:   12/05/19 0900 12/05/19 0906 12/05/19 1000 12/05/19 1100  BP: 130/84  139/76 132/81  Pulse: 82 83 84 85  Resp: (!) 27 (!) 23 (!) 26 (!) 28  Temp:      TempSrc:      SpO2: 97% 100% 95% 97%  Weight:      Height:         Intake/Output Summary (Last 24 hours) at 12/05/2019 1258 Last data filed at 12/05/2019 0400 Gross per 24 hour  Intake 240 ml  Output 1250 ml  Net -1010 ml      PHYSICAL EXAM  General: Well developed, well nourished, in no acute distress HEENT:  Normocephalic and atramatic Neck:  No JVD.  Lungs: normal effort of breathing on room air; mild inspiratory wheezing Heart: HRRR . Normal S1 and S2 without gallops or murmurs.  Abdomen: nondistended Msk:  Gait not assessed. No obvious deformities. Extremities: No clubbing, cyanosis or edema.   Neuro: Alert and oriented X 3. Psych:  Good affect, responds appropriately   LABS: Basic Metabolic Panel: Recent Labs    12/03/19 0510 12/04/19 0429 12/05/19 1024  NA 147* 146* 143  K 3.4* 3.0* 3.1*  CL 111 112* 112*  CO2 28 26 22   GLUCOSE 97 143* 107*  BUN 28* 27* 21  CREATININE 0.70 0.79 0.74  CALCIUM 8.2* 8.4* 8.1*  MG 2.1 2.0 1.8  PHOS 3.1  --   --    Liver Function Tests: No results for input(s): AST, ALT, ALKPHOS, BILITOT, PROT, ALBUMIN in the last 72 hours. No results for input(s): LIPASE, AMYLASE in the last 72 hours. CBC: Recent Labs    12/04/19 0429 12/05/19 0536  WBC 14.0* 16.6*  NEUTROABS 10.5* 12.2*  HGB 12.2* 11.7*  HCT 36.5* 36.2*  MCV 97.3 100.8*  PLT 221 246   Cardiac Enzymes: Recent Labs    12/03/19 1922  CKMB 2.6   BNP: Invalid input(s): POCBNP D-Dimer: No results for input(s): DDIMER in the last 72 hours. Hemoglobin A1C: No results for input(s): HGBA1C in the last 72  hours. Fasting Lipid Panel: No results for input(s): CHOL, HDL, LDLCALC, TRIG, CHOLHDL, LDLDIRECT in the last 72 hours. Thyroid Function Tests: No results for input(s): TSH, T4TOTAL, T3FREE, THYROIDAB in the last 72 hours.  Invalid input(s): FREET3 Anemia Panel: No results for input(s): VITAMINB12, FOLATE, FERRITIN, TIBC, IRON, RETICCTPCT in the last 72 hours.  No results found.   Echo LVEF 50-55%, with   TELEMETRY: sinus rhythm, 90 bpm  ASSESSMENT AND PLAN:  Active Problems:   Cardiac arrest [facility])   STEMI involving right coronary artery (HCC)   Acute respiratory failure (HCC)   Cocaine use    1. Inferior STEMI, primary PCI with DES to RCA, with preserved LV function, now on dual antiplatelet therapy 2. Cardiac arrest 3. Respiratory failure, now extubated. Resolved. 4. Polysubstance abuse, with recent cocaine use 5. Disorientation/delirum, much improved today  Recommendations: 1. Continue aspirin and Brilinta uninterrupted for one year 2. Continue Crestor, metoprolol tartrate 50 mg BID and lisinopril 3. Cardiac rehab 4. No further cardiac diagnostics recommended at this time 5. May transfer to telemetry from cardiovascular standpoint.   Clabe Seal, PA-C 12/05/2019 12:58 PM

## 2019-12-06 LAB — GLUCOSE, CAPILLARY
Glucose-Capillary: 78 mg/dL (ref 70–99)
Glucose-Capillary: 86 mg/dL (ref 70–99)
Glucose-Capillary: 86 mg/dL (ref 70–99)
Glucose-Capillary: 86 mg/dL (ref 70–99)
Glucose-Capillary: 96 mg/dL (ref 70–99)
Glucose-Capillary: 96 mg/dL (ref 70–99)

## 2019-12-06 LAB — BASIC METABOLIC PANEL
Anion gap: 6 (ref 5–15)
BUN: 23 mg/dL (ref 8–23)
CO2: 23 mmol/L (ref 22–32)
Calcium: 8.1 mg/dL — ABNORMAL LOW (ref 8.9–10.3)
Chloride: 115 mmol/L — ABNORMAL HIGH (ref 98–111)
Creatinine, Ser: 0.83 mg/dL (ref 0.61–1.24)
GFR calc Af Amer: >60 mL/min (ref >60)
GFR calc non Af Amer: >60 mL/min (ref >60)
Glucose, Bld: 92 mg/dL (ref 70–99)
Potassium: 3.5 mmol/L (ref 3.5–5.1)
Sodium: 144 mmol/L (ref 135–145)

## 2019-12-06 LAB — CBC WITH DIFFERENTIAL/PLATELET
Abs Immature Granulocytes: 0.12 10*3/uL — ABNORMAL HIGH (ref 0.00–0.07)
Basophils Absolute: 0 10*3/uL (ref 0.0–0.1)
Basophils Relative: 0 %
Eosinophils Absolute: 0 10*3/uL (ref 0.0–0.5)
Eosinophils Relative: 0 %
HCT: 34.1 % — ABNORMAL LOW (ref 39.0–52.0)
Hemoglobin: 11.3 g/dL — ABNORMAL LOW (ref 13.0–17.0)
Immature Granulocytes: 1 %
Lymphocytes Relative: 12 %
Lymphs Abs: 1.9 10*3/uL (ref 0.7–4.0)
MCH: 33.1 pg (ref 26.0–34.0)
MCHC: 33.1 g/dL (ref 30.0–36.0)
MCV: 100 fL (ref 80.0–100.0)
Monocytes Absolute: 1.8 10*3/uL — ABNORMAL HIGH (ref 0.1–1.0)
Monocytes Relative: 11 %
Neutro Abs: 12.1 10*3/uL — ABNORMAL HIGH (ref 1.7–7.7)
Neutrophils Relative %: 76 %
Platelets: 249 10*3/uL (ref 150–400)
RBC: 3.41 MIL/uL — ABNORMAL LOW (ref 4.22–5.81)
RDW: 12.9 % (ref 11.5–15.5)
WBC: 15.9 10*3/uL — ABNORMAL HIGH (ref 4.0–10.5)
nRBC: 0 % (ref 0.0–0.2)

## 2019-12-06 MED ORDER — LORAZEPAM 1 MG PO TABS: 1.0000 mg | ORAL_TABLET | ORAL | Status: AC | PRN

## 2019-12-06 MED ORDER — LORAZEPAM 2 MG/ML IJ SOLN
1.0000 mg | INTRAMUSCULAR | Status: AC | PRN
  Administered 2019-12-06 (×2): 2 mg via INTRAVENOUS
  Filled 2019-12-06: qty 2

## 2019-12-06 NOTE — Progress Notes (Signed)
Occupational Therapy Treatment Patient Details Name: Kurt [name] MRN: [id number] DOB: 31-Jan-1956 Today's Date: 12/06/2019    History of present illness Pt is a 64 y.o. male found down on scene with lit cigarette.  Pt noted with cardiac arrest; intubated and s/p cath 12/24.  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   OT comments  Mr. Sgro was seen for OT treatment on this date. Spoke with pt primary RN upon arrival to unit who informed this Pryor Curia pt has received ativan overnight due to agitation/confusion, but cleared for therapy. Upon arrival to room pt room pt sleeping semi-supine in bed. Pt wakes to VCs and acknowledges this Chief Strategy Officer. Pt able to state name, and gives the year as 2021, but is unable to answer further A&O questions. Pt states the place as "in the tundra" and is unable to provide situational information. Pt noted to be low in bed with legs hanging over the R edge. This Pryor Curia provides pt with max assist for boost in bed. Pt also able to participate in rolling side to side given min assist to adjust bedding. Pt more alert/talkative but still has significant difficulty following 1 step VCs despite multimodal cueing. This Pryor Curia attempts to engage pt in UE there-ex as described below to promote improved functional use of BUE and overall strength. Pt requires max a with consistent cueing to perform. Pt left with RN in room to administer am medications. Pt progressing toward goals and continues to benefit from skilled OT services to maximize return to PLOF and minimize risk of future falls, injury, caregiver burden, and readmission. Will continue to follow POC. Discharge recommendation remains appropriate.    Follow Up Recommendations  SNF     Equipment Recommendations  (Defer to next venue of care)    Recommendations for Other Services      Precautions / Restrictions Precautions Precautions: Fall Restrictions Weight Bearing Restrictions: No Other Position/Activity Restrictions: monitor HR and O2       Mobility Bed Mobility Overal bed mobility: Needs Assistance Bed Mobility: Rolling Rolling: Min assist         General bed mobility comments: Mobility limited 2/2 impaired cognition. Pt participates in bed boost with therapist providing max assist to re-position in bed. Pt also engages in rolling side to side given min assist.  Transfers                 General transfer comment: Deferred for pt safety.    Balance Overall balance assessment: Needs assistance Sitting-balance support: Bilateral upper extremity supported;Feet supported   Sitting balance - Comments: Pt comes to long sitting in bed for brief instances with BUE support on bed rails. Pt limited by cognition/activity tolerance.                                   ADL either performed or assessed with clinical judgement   ADL Overall ADL's : Needs assistance/impaired                                       General ADL Comments: Pt functionally limited 2/2 generalized weakness and decreased cognition this date. Per RN, pt had  some confusion/agitation overnight and has remained confused this AM. Pt continues to require mod-max assist for ADL tasks at bed level. Sitting balance appears to be improving, however, functional mobility unsafe to perfrom this date 2/2 pt cognitive status.     Vision Patient Visual Report: No change from baseline     Perception     Praxis      Cognition Arousal/Alertness: Suspect due to medications;Lethargic Behavior During Therapy: Restless;Flat affect Overall Cognitive Status: Impaired/Different from baseline                   Orientation Level: Disoriented  to;Place;Time;Situation(Pt able to state year as 2021, but cannot give additional parts of date. Gives name/DOB accurately. States he is "in the tundra" when asked about place.)     Following Commands: Follows one step commands inconsistently Safety/Judgement: Decreased awareness of safety;Decreased awareness of deficits     General Comments: Pt has difficulty attending to tasks t/o session. Is unable to follow 1 step VCs given multimodal prompting from therapist to encourage participation. Per RN this is likely due to medications.        Exercises Other Exercises Other Exercises: OT facilitates pt participation in straight arm raises x5 bilaterally and elbow flex/ext x5 bilaterally with MAX VCs and eventually transitioning to partial PROM with pt limited by cognitive status this date. Other Exercises: OT assists pt with bed mobility/repositioning in bed.   Shoulder Instructions       General Comments IJ triple lumen intact at start/end of session.    Pertinent Vitals/ Pain       Pain Assessment: No/denies pain  Home Living                                          Prior Functioning/Environment              Frequency  Min 2X/week        Progress Toward Goals  OT Goals(current goals can now be found in the care plan section)  Progress towards OT goals: Progressing toward goals  Acute Rehab OT Goals Patient Stated Goal: to be able to walk again OT Goal Formulation: With patient/family Time For Goal Achievement: 12/17/19 Potential to Achieve Goals: Good  Plan Discharge plan remains appropriate;Frequency remains appropriate    Co-evaluation                 AM-PAC OT "6 Clicks" Daily Activity     Outcome Measure   Help from another person eating meals?: A Lot Help from another person taking care of personal grooming?: A Lot Help from another person toileting, which includes using toliet, bedpan, or urinal?: Total Help from another person  bathing (including washing, rinsing, drying)?: A Lot Help from another person to put on and taking off regular upper body clothing?: A Lot Help from another person to put on and taking off regular lower body clothing?: A Lot 6 Click Score: 11    End of Session    OT Visit Diagnosis: Unsteadiness on feet (R26.81);Muscle weakness (generalized) (M62.81)   Activity Tolerance Other (comment)(limited due to impaired cognition)   Patient Left in bed;with call bell/phone within reach;with bed alarm set;with nursing/sitter in room   Nurse Communication (Pt orientation level)        Time: [id number] OT Time Calculation (min): 15 min  Charges: OT General Charges $OT Visit: 1 Visit OT  Treatments $Therapeutic Activity: 8-22 mins  Shara Blazing, M.S., OTR/L Ascom: [phone] 12/06/19, 10:32 AM

## 2019-12-06 NOTE — Progress Notes (Signed)
PROGRESS NOTE    Kurt [name]  [id number] DOB: [birth year] DOA: 11/23/2019 PCP: Venia Carbon, MD    Brief Narrative:  He was initially admitted to ICU on 12/24 after V-fib cardiac arrest due to STEMI.  Status post primary PCI with DES to RCA, now on aspirin and Brilinta, being followed by cardiology.  Of note, patient has history of cocaine abuse.  Patient was extubated , had some agitation requiring Precedex.  This was thought due to ICU delirium and possible withdrawal from cocaine or other substances.  He was able to be weaned from [place] and transferred to hospitalist team now.   Consultants:   Cards, pccm  Procedures: CT, ECHo  Antimicrobials:   none   Subjective: Pt seen and examined.  He has no chest pain, shortness of breath or any other symptoms or complaints.  Objective: Vitals:   12/06/19 1200 12/06/19 1300 12/06/19 1400 12/06/19 1600  BP:  (!) 151/94 (!) 146/95   Pulse: 78 70 84   Resp: (!) 23 15 (!) 22   Temp:    99.5 F (37.5 C)  TempSrc:    Oral  SpO2: 97% 100% 100%   Weight:      Height:        Intake/Output Summary (Last 24 hours) at 12/06/2019 1703 Last data filed at 12/06/2019 1442 Gross per 24 hour  Intake 410 ml  Output --  Net 410 ml   Filed Weights   12/04/19 0352 12/05/19 0500 12/06/19 0430  Weight: 85.1 kg 83.9 kg 77.6 kg    Examination:  General exam: Appears calm and comfortable, laying in bed Respiratory system: Clear to auscultation. Respiratory effort normal.  No wheeze rales rhonchi's Cardiovascular system: S1 & S2 heard, RRR. No  murmurs, rubs, gallops or clicks.  Gastrointestinal system: Abdomen is nondistended, soft and nontender. No organomegaly or masses felt. Normal bowel sounds heard. Central nervous system: Alert and awake, grossly intact  Extremities: No edema Skin: Warm dry Psychiatry:  Mood & affect appropriate in current setting.     Data Reviewed: I have personally reviewed following labs and imaging  studies  CBC: Recent Labs  Lab 12/02/19 0552 12/03/19 0510 12/04/19 0429 12/05/19 0536 12/06/19 0522  WBC 13.7* 12.7* 14.0* 16.6* 15.9*  NEUTROABS 9.8* 8.8* 10.5* 12.2* 12.1*  HGB 11.4* 11.3* 12.2* 11.7* 11.3*  HCT 35.7* 35.5* 36.5* 36.2* 34.1*  MCV 100.8* 101.7* 97.3 100.8* 100.0  PLT 181 197 221 246 378   Basic Metabolic Panel: Recent Labs  Lab 12/02/19 0552 12/03/19 0510 12/04/19 0429 12/05/19 1024 12/06/19 0522  NA 147* 147* 146* 143 144  K 2.9* 3.4* 3.0* 3.1* 3.5  CL 106 111 112* 112* 115*  CO2 32 _0 GLUCOSE 109* 97 143* 107* 92  BUN 28* 28* 27* 21 23  CREATININE 0.74 0.70 0.79 0.74 0.83  CALCIUM 8.0* 8.2* 8.4* 8.1* 8.1*  MG  --  2.1 2.0 1.8  --   PHOS  --  3.1  --   --   --    GFR: Estimated Creatinine Clearance: 93.5 mL/min (by C-G formula based on SCr of 0.83 mg/dL). Liver Function Tests: Recent Labs  Lab 11/30/19 0428  AST 39  ALT 39  ALKPHOS 39  BILITOT 0.6  PROT 5.7*  ALBUMIN 2.5*   No results for input(s): LIPASE, AMYLASE in the last 168 hours. No results for input(s): AMMONIA in the last 168 hours. Coagulation Profile: No results for input(s): INR, PROTIME in the  last 168 hours. Cardiac Enzymes: Recent Labs  Lab 12/03/19 1922  CKMB 2.6   BNP (last 3 results) No results for input(s): PROBNP in the last 8760 hours. HbA1C: No results for input(s): HGBA1C in the last 72 hours. CBG: Recent Labs  Lab 12/05/19 2359 12/06/19 0420 12/06/19 0731 12/06/19 1132 12/06/19 1556  GLUCAP 96 78 86 86 96   Lipid Profile: No results for input(s): CHOL, HDL, LDLCALC, TRIG, CHOLHDL, LDLDIRECT in the last 72 hours. Thyroid Function Tests: No results for input(s): TSH, T4TOTAL, FREET4, T3FREE, THYROIDAB in the last 72 hours. Anemia Panel: No results for input(s): VITAMINB12, FOLATE, FERRITIN, TIBC, IRON, RETICCTPCT in the last 72 hours. Sepsis Labs: No results for input(s): PROCALCITON, LATICACIDVEN in the last 168 hours.  Recent  Results (from the past 240 hour(s))  Culture, respiratory (non-expectorated)     Status: None   Collection Time: 12/01/19  1:24 PM   Specimen: Tracheal Aspirate; Respiratory  Result Value Ref Range Status   Specimen Description   Final    TRACHEAL ASPIRATE Performed at [facility], [address], [place], [place] [address]    Special Requests   Final    NONE Performed at [facility], [address] [place], [place] [address]    Gram Stain   Final    RARE WBC PRESENT, PREDOMINANTLY PMN MODERATE SQUAMOUS EPITHELIAL CELLS PRESENT ABUNDANT YEAST RARE GRAM POSITIVE COCCI    Culture   Final    FEW CANDIDA TROPICALIS Standardized susceptibility testing for this organism is not available. Performed at [facility], [place] [address], [place], [place] [address]    Report Status 12/04/2019 FINAL  Final         Radiology Studies: No results found.      Scheduled Meds: . aspirin  81 mg Oral Daily  . chlorhexidine gluconate (MEDLINE KIT)  15 mL Mouth Rinse BID  . Chlorhexidine Gluconate Cloth  6 each Topical Daily  . enoxaparin (LOVENOX) injection  40 mg Subcutaneous Q24H  . famotidine  20 mg Oral BID  . feeding supplement (ENSURE ENLIVE)  237 mL Oral BID BM  . folic acid  1 mg Oral Daily  . ipratropium-albuterol  3 mL Nebulization Q6H  . lisinopril  5 mg Oral Daily  . metoprolol tartrate  50 mg Oral BID  . multivitamin with minerals  1 tablet Oral Daily  . predniSONE  40 mg Oral Q breakfast  . rosuvastatin  40 mg Oral q1800  . thiamine  100 mg Oral Daily  . ticagrelor  90 mg Oral BID   Continuous Infusions: . sodium chloride    . sodium chloride      Assessment & Plan:   Active Problems:   Cardiac arrest [facility])   STEMI involving right coronary artery (HCC)   Acute respiratory failure (HCC)   Cocaine use   Severe ACUTE Hypoxic and Hypercapnic Respiratory Failure:  -Resolved now  -S/p extubation As needed oxygen as needed Status  post Lasix -Continue DuoNeb  -Weaning down to room air gradually; currently on 2 L -Was on Solu-Medrol changed to prednisone taper - Cont to monitor in stepdown unit for now  Inferior STEMI: Recovering.  Primary PCI with DES RCA with preserved left ventricular function on dual antiplatelet therapy Cocaine and CAD recent history Follow up Cardiology recs: Continue dual antiplatelet therapy uninterrupted for 1 year, continue statin, Lopressor 50 mg twice daily and lisinopril 5 mg daily -Cardiac rehabmay be helpful  -Follow-up with Dr.  Callwood in 1 to 2 weeks Ok to d/c from cardiac stand point.  Cardiac Arrest-s/p hypothermia protocal Cards following   Electrolyte imbalance: Found to be hypokalemic and hypomagnesemic -Status post replacement, resolved -Continue to monitor labs  Drug abuse: Provided counseling -On benzodiazepine as needed with CIWA protocol  delirium from cardiogenic shock: Per record was near death -Recovery slowly -PT OT evaluation-recommends SNF -Dietitian consult and follow recommendation    DVT prophylaxis: lovenox Code Status: Full Family Communication: None at bedside Disposition Plan: SNF pending       LOS: 13 days   Time spent: 45 minutes with more than 50% COC    Nolberto Hanlon, MD [facility] Pager [phone]  If 7PM-7AM, please contact night-coverage [email or web address] Password [email or web address] 12/06/2019, 5:03 PM

## 2019-12-06 NOTE — Progress Notes (Signed)
[facility] Cardiology  SUBJECTIVE: Patient laying in bed,  answering questions appropriately, denies chest pain or shortness of breath   Vitals:   12/06/19 0400 12/06/19 0430 12/06/19 0500 12/06/19 0600  BP: (!) 155/91  (!) 160/88 (!) 138/97  Pulse: 80  85 95  Resp: (!) 26  (!) 26 (!) 24  Temp: 98.4 F (36.9 C)     TempSrc: Axillary     SpO2: 99%  97% 97%  Weight:  77.6 kg    Height:         Intake/Output Summary (Last 24 hours) at 12/06/2019 0802 Last data filed at 12/06/2019 0000 Gross per 24 hour  Intake 300 ml  Output 850 ml  Net -550 ml      PHYSICAL EXAM  General: Well developed, well nourished, in no acute distress HEENT:  Normocephalic and atramatic Neck:  No JVD.  Lungs: Clear bilaterally to auscultation and percussion. Heart: HRRR . Normal S1 and S2 without gallops or murmurs.  Abdomen: Bowel sounds are positive, abdomen soft and non-tender  Msk:  Back normal, normal gait. Normal strength and tone for age. Extremities: No clubbing, cyanosis or edema.   Neuro: Alert and oriented X 3. Psych:  Good affect, responds appropriately   LABS: Basic Metabolic Panel: Recent Labs    12/04/19 0429 12/05/19 1024 12/06/19 0522  NA 146* 143 144  K 3.0* 3.1* 3.5  CL 112* 112* 115*  CO2 26 22 23   GLUCOSE 143* 107* 92  BUN 27* 21 23  CREATININE 0.79 0.74 0.83  CALCIUM 8.4* 8.1* 8.1*  MG 2.0 1.8  --    Liver Function Tests: No results for input(s): AST, ALT, ALKPHOS, BILITOT, PROT, ALBUMIN in the last 72 hours. No results for input(s): LIPASE, AMYLASE in the last 72 hours. CBC: Recent Labs    12/05/19 0536 12/06/19 0522  WBC 16.6* 15.9*  NEUTROABS 12.2* 12.1*  HGB 11.7* 11.3*  HCT 36.2* 34.1*  MCV 100.8* 100.0  PLT 246 249   Cardiac Enzymes: Recent Labs    12/03/19 1922  CKMB 2.6   BNP: Invalid input(s): POCBNP D-Dimer: No results for input(s): DDIMER in the last 72 hours. Hemoglobin A1C: No results for input(s): HGBA1C in the last 72 hours. Fasting  Lipid Panel: No results for input(s): CHOL, HDL, LDLCALC, TRIG, CHOLHDL, LDLDIRECT in the last 72 hours. Thyroid Function Tests: No results for input(s): TSH, T4TOTAL, T3FREE, THYROIDAB in the last 72 hours.  Invalid input(s): FREET3 Anemia Panel: No results for input(s): VITAMINB12, FOLATE, FERRITIN, TIBC, IRON, RETICCTPCT in the last 72 hours.  No results found.   Echo LVEF 50 to 55%  TELEMETRY: Sinus rhythm 80 bpm:  ASSESSMENT AND PLAN:  Active Problems:   Cardiac arrest [facility])   STEMI involving right coronary artery (HCC)   Acute respiratory failure (HCC)   Cocaine use    1.  Inferior STEMI, primary PCI with DES to RCA, with preserved left ventricular function, on dual antiplatelet therapy 2.  Cardiac arrest, status post hypothermia protocol 3.  Respiratory failure, extubated, resolved 4.  Polysubstance abuse, recent cocaine use 5.  Disorientation/delirium, much improved  Recommendations  1.  Agree with current therapy 2.  Continue dual antiplatelet therapy uninterrupted for 1 year 3.  Continue metoprolol tartrate and lisinopril 4.  Continue rosuvastatin for hyperlipidemia management 5.  No further cardiac diagnostics at this time 6.  Increase activity, ambulate 7.  Cardiac rehabilitation 8.  May discharge home from cardiovascular perspective 9.  Follow-up with Dr. Clayborn Bigness  in 1 to 2 weeks  Sign off for now, please call if any questions   Kurt Cowman, MD, PhD, [facility] 12/06/2019 8:02 AM

## 2019-12-07 LAB — CBC WITH DIFFERENTIAL/PLATELET
Abs Immature Granulocytes: 0.13 10*3/uL — ABNORMAL HIGH (ref 0.00–0.07)
Basophils Absolute: 0 10*3/uL (ref 0.0–0.1)
Basophils Relative: 0 %
Eosinophils Absolute: 0.1 10*3/uL (ref 0.0–0.5)
Eosinophils Relative: 0 %
HCT: 33.7 % — ABNORMAL LOW (ref 39.0–52.0)
Hemoglobin: 11.4 g/dL — ABNORMAL LOW (ref 13.0–17.0)
Immature Granulocytes: 1 %
Lymphocytes Relative: 15 %
Lymphs Abs: 2.3 10*3/uL (ref 0.7–4.0)
MCH: 33.4 pg (ref 26.0–34.0)
MCHC: 33.8 g/dL (ref 30.0–36.0)
MCV: 98.8 fL (ref 80.0–100.0)
Monocytes Absolute: 1.5 10*3/uL — ABNORMAL HIGH (ref 0.1–1.0)
Monocytes Relative: 10 %
Neutro Abs: 11 10*3/uL — ABNORMAL HIGH (ref 1.7–7.7)
Neutrophils Relative %: 74 %
Platelets: 282 10*3/uL (ref 150–400)
RBC: 3.41 MIL/uL — ABNORMAL LOW (ref 4.22–5.81)
RDW: 12.9 % (ref 11.5–15.5)
WBC: 15.1 10*3/uL — ABNORMAL HIGH (ref 4.0–10.5)
nRBC: 0 % (ref 0.0–0.2)

## 2019-12-07 LAB — BASIC METABOLIC PANEL
Anion gap: 8 (ref 5–15)
BUN: 20 mg/dL (ref 8–23)
CO2: 23 mmol/L (ref 22–32)
Calcium: 8.4 mg/dL — ABNORMAL LOW (ref 8.9–10.3)
Chloride: 112 mmol/L — ABNORMAL HIGH (ref 98–111)
Creatinine, Ser: 0.88 mg/dL (ref 0.61–1.24)
GFR calc Af Amer: >60 mL/min (ref >60)
GFR calc non Af Amer: >60 mL/min (ref >60)
Glucose, Bld: 95 mg/dL (ref 70–99)
Potassium: 3.5 mmol/L (ref 3.5–5.1)
Sodium: 143 mmol/L (ref 135–145)

## 2019-12-07 LAB — GLUCOSE, CAPILLARY
Glucose-Capillary: 107 mg/dL — ABNORMAL HIGH (ref 70–99)
Glucose-Capillary: 74 mg/dL (ref 70–99)
Glucose-Capillary: 78 mg/dL (ref 70–99)
Glucose-Capillary: 86 mg/dL (ref 70–99)

## 2019-12-07 MED ORDER — MAGNESIUM OXIDE 400 (241.3 MG) MG PO TABS
400.0000 mg | ORAL_TABLET | Freq: Every day | ORAL | Status: DC
  Administered 2019-12-07 – 2019-12-11 (×5): 400 mg via ORAL
  Filled 2019-12-07 (×5): qty 1

## 2019-12-07 MED ORDER — POTASSIUM CHLORIDE CRYS ER 20 MEQ PO TBCR
20.0000 meq | EXTENDED_RELEASE_TABLET | Freq: Once | ORAL | Status: AC
  Administered 2019-12-07: 20 meq via ORAL
  Filled 2019-12-07: qty 1

## 2019-12-07 MED ORDER — POLYVINYL ALCOHOL 1.4 % OP SOLN
1.0000 [drp] | OPHTHALMIC | Status: DC | PRN
  Administered 2019-12-08: 1 [drp] via OPHTHALMIC
  Filled 2019-12-07: qty 15

## 2019-12-07 NOTE — Progress Notes (Signed)
Physical Therapy Treatment Patient Details Name: Kurt [name] MRN: [id number] DOB: 1956/09/20 Today's Date: 12/07/2019    History of Present Illness Pt is a 64 y.o. male found down on scene with lit cigarette.  Pt noted with cardiac arrest; intubated and s/p cath 12/24.  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    PT Comments    Pt resting in bed upon PT arrival.  Able to perform B LE ex's in bed with rest breaks between ex's (d/t increased SOB and respiratory rate with ex's).  Transport arrived with w/c to transfer pt from ICU to new telemetry floor room.  Pt able to perform stand pivot/stand step turn with mod assist x1 (pt noted to be SOB with increased respiratory rate into mid 30's with transfers requiring rest break after each activity).  Pt assisted into new (low) bed in new room with bed in lowest position and bed alarm on (nurse reports pt fell last night--pt reports he was getting OOB to go to the bathroom; pt reports no pain or injuries post fall).  Transport present in pt's room end of session and nurse notified pt would benefit from fall mats for safety.  Will continue to focus on strengthening and progressive functional mobility per pt tolerance.    Follow Up Recommendations  SNF     Equipment Recommendations  Rolling walker with 5" wheels;Wheelchair (measurements PT)    Recommendations for Other Services OT consult     Precautions / Restrictions Precautions Precautions: Fall Restrictions Weight Bearing Restrictions: No Other Position/Activity Restrictions: monitor HR and O2    Mobility  Bed Mobility Overal bed mobility: Needs Assistance Bed Mobility: Supine to Sit     Supine to sit: Mod assist;HOB elevated     General bed  mobility comments: assist for trunk and B LE's semi-supine to sitting edge of bed; vc's for overall technique  Transfers Overall transfer level: Needs assistance Equipment used: None Transfers: Stand Pivot Transfers(ICU bed to w/c to (low) bed in new room)   Stand pivot transfers: Mod assist       General transfer comment: stand pivot/stand step turn bed to w/c to bed; vc's for technique; mild intermittent knee blocking for safety; increased time to perform  Ambulation/Gait             General Gait Details: deferred d/t increased SOB and increased respiratory rate with transfers   Stairs             Wheelchair Mobility    Modified Rankin (Stroke Patients Only)       Balance Overall balance assessment: Needs assistance Sitting-balance support: No upper extremity supported;Feet supported Sitting balance-Leahy Scale: Fair Sitting balance - Comments: steady static sitting   Standing balance support: Single extremity supported Standing balance-Leahy Scale: Poor Standing balance comment: pt requiring at least single UE support for static standing                            Cognition Arousal/Alertness: Awake/alert Behavior During Therapy: Flat affect Overall Cognitive Status: Impaired/Different from baseline Area of Impairment: Orientation;Attention;Memory;Following commands;Safety/judgement;Awareness                 Orientation Level: (Oriented to person, place, and situation) Current Attention Level: Selective Memory: Decreased short-term memory Following  Commands: Follows one step commands inconsistently Safety/Judgement: Decreased awareness of safety;Decreased awareness of deficits Awareness: Anticipatory          Exercises General Exercises - Lower Extremity Ankle Circles/Pumps: AROM;Strengthening;Both;10 reps;Supine Short Arc Quad: AROM;Strengthening;Both;10 reps;Supine Heel Slides: AROM;Strengthening;Both;10 reps;Supine Hip  ABduction/ADduction: AROM;Strengthening;Both;10 reps;Supine    General Comments   Nursing cleared pt for participation in physical therapy.  Pt agreeable to PT session.      Pertinent Vitals/Pain Pain Assessment: No/denies pain  HR in 60's to 70's bpm during session; BP 125/72 beginning of session and 144/88 end of session at rest; O2 sats 96% or greater on room air during session's activities.    Home Living                      Prior Function            PT Goals (current goals can now be found in the care plan section) Acute Rehab PT Goals Patient Stated Goal: to be able to walk again PT Goal Formulation: With patient Time For Goal Achievement: 12/18/19 Potential to Achieve Goals: Good Progress towards PT goals: Progressing toward goals    Frequency    Min 2X/week      PT Plan Current plan remains appropriate    Co-evaluation              AM-PAC PT "6 Clicks" Mobility   Outcome Measure  Help needed turning from your back to your side while in a flat bed without using bedrails?: A Little Help needed moving from lying on your back to sitting on the side of a flat bed without using bedrails?: A Lot Help needed moving to and from a bed to a chair (including a wheelchair)?: A Lot Help needed standing up from a chair using your arms (e.g., wheelchair or bedside chair)?: A Lot Help needed to walk in hospital room?: Total Help needed climbing 3-5 steps with a railing? : Total 6 Click Score: 11    End of Session Equipment Utilized During Treatment: Gait belt Activity Tolerance: Patient limited by fatigue Patient left: in bed;with call bell/phone within reach;with bed alarm set;Other (comment)(bed in lowest position; transport present) Nurse Communication: Mobility status;Precautions PT Visit Diagnosis: Other abnormalities of gait and mobility (R26.89);Muscle weakness (generalized) (M62.81);Difficulty in walking, not elsewhere classified (R26.2)      Time: [id number] PT Time Calculation (min) (ACUTE ONLY): 34 min  Charges:  $Therapeutic Exercise: 8-22 mins $Therapeutic Activity: 8-22 mins                     Leitha Bleak, PT 12/07/19, 11:27 AM

## 2019-12-07 NOTE — Progress Notes (Addendum)
PROGRESS NOTE    Kurt [name]  [id number] DOB: [birth year] DOA: 11/23/2019 PCP: Venia Carbon, MD    Brief Narrative:  He was initially admitted to ICU on 12/24 after V-fib cardiac arrest due to STEMI. Status post primary PCI with DES to RCA, now on aspirin and Brilinta, being followed by cardiology. Of note, patient has history of cocaine abuse. Patient was extubated , had some agitation requiring Precedex. This was thought due to ICU delirium and possible withdrawal from cocaine or other substances. He was able to be weaned from [place] and transferred to hospitalist team now.   Consultants:   Cards, pccm  Procedures: CT, ECHo  Antimicrobials:   none   Subjective: Son at bedside.  Patient denies any shortness of breath, or chest pain.  No other complaints.  Objective: Vitals:   12/07/19 0837 12/07/19 0900 12/07/19 1000 12/07/19 1432  BP:  129/79 125/72   Pulse:  78 64   Resp:  (!) 24 (!) 23   Temp:      TempSrc:      SpO2: 94% 94% 95% 91%  Weight:      Height:        Intake/Output Summary (Last 24 hours) at 12/07/2019 1530 Last data filed at 12/06/2019 1910 Gross per 24 hour  Intake --  Output 500 ml  Net -500 ml   Filed Weights   12/05/19 0500 12/06/19 0430 12/07/19 0500  Weight: 83.9 kg 77.6 kg 79.1 kg    Examination:  General exam: Appears calm and comfortable , daughter at bedside.  NAD Respiratory system: Clear to auscultation. Respiratory effort normal. Cardiovascular system: S1 & S2 heard, RRR. No JVD, murmurs, rubs, gallops or clicks. Gastrointestinal system: Abdomen is nondistended, soft and nontender. Normal bowel sounds heard. Central nervous system: Alert and oriented. No focal neurological deficits. Extremities: No edema or cyanosis Psychiatry: Judgement and insight appear normal. Mood & affect appropriate.     Data Reviewed: I have personally reviewed following labs and imaging studies  CBC: Recent Labs  Lab  12/03/19 0510 12/04/19 0429 12/05/19 0536 12/06/19 0522 12/07/19 0511  WBC 12.7* 14.0* 16.6* 15.9* 15.1*  NEUTROABS 8.8* 10.5* 12.2* 12.1* 11.0*  HGB 11.3* 12.2* 11.7* 11.3* 11.4*  HCT 35.5* 36.5* 36.2* 34.1* 33.7*  MCV 101.7* 97.3 100.8* 100.0 98.8  PLT 197 221 246 249 983   Basic Metabolic Panel: Recent Labs  Lab 12/03/19 0510 12/04/19 0429 12/05/19 1024 12/06/19 0522 12/07/19 0511  NA 147* 146* 143 144 143  K 3.4* 3.0* 3.1* 3.5 3.5  CL 111 112* 112* 115* 112*  CO2 _0 GLUCOSE 97 143* 107* 92 95  BUN 28* 27* _1 CREATININE 0.70 0.79 0.74 0.83 0.88  CALCIUM 8.2* 8.4* 8.1* 8.1* 8.4*  MG 2.1 2.0 1.8  --   --   PHOS 3.1  --   --   --   --    GFR: Estimated Creatinine Clearance: 88.2 mL/min (by C-G formula based on SCr of 0.88 mg/dL). Liver Function Tests: No results for input(s): AST, ALT, ALKPHOS, BILITOT, PROT, ALBUMIN in the last 168 hours. No results for input(s): LIPASE, AMYLASE in the last 168 hours. No results for input(s): AMMONIA in the last 168 hours. Coagulation Profile: No results for input(s): INR, PROTIME in the last 168 hours. Cardiac Enzymes: Recent Labs  Lab 12/03/19 1922  CKMB 2.6   BNP (last 3 results) No results for input(s): PROBNP in the last 8760 hours.  HbA1C: No results for input(s): HGBA1C in the last 72 hours. CBG: Recent Labs  Lab 12/06/19 1556 12/06/19 1958 12/07/19 0006 12/07/19 0346 12/07/19 0743  GLUCAP 96 86 78 74 86   Lipid Profile: No results for input(s): CHOL, HDL, LDLCALC, TRIG, CHOLHDL, LDLDIRECT in the last 72 hours. Thyroid Function Tests: No results for input(s): TSH, T4TOTAL, FREET4, T3FREE, THYROIDAB in the last 72 hours. Anemia Panel: No results for input(s): VITAMINB12, FOLATE, FERRITIN, TIBC, IRON, RETICCTPCT in the last 72 hours. Sepsis Labs: No results for input(s): PROCALCITON, LATICACIDVEN in the last 168 hours.  Recent Results (from the past 240 hour(s))  Culture, respiratory  (non-expectorated)     Status: None   Collection Time: 12/01/19  1:24 PM   Specimen: Tracheal Aspirate; Respiratory  Result Value Ref Range Status   Specimen Description   Final    TRACHEAL ASPIRATE Performed at [facility], [address] [place], [place] [address]    Special Requests   Final    NONE Performed at [facility], [address] [place], [place] [address]    Gram Stain   Final    RARE WBC PRESENT, PREDOMINANTLY PMN MODERATE SQUAMOUS EPITHELIAL CELLS PRESENT ABUNDANT YEAST RARE GRAM POSITIVE COCCI    Culture   Final    FEW CANDIDA TROPICALIS Standardized susceptibility testing for this organism is not available. Performed at [facility], [place] [address], [place],  [address]    Report Status 12/04/2019 FINAL  Final         Radiology Studies: No results found.      Scheduled Meds: . aspirin  81 mg Oral Daily  . chlorhexidine gluconate (MEDLINE KIT)  15 mL Mouth Rinse BID  . Chlorhexidine Gluconate Cloth  6 each Topical Daily  . enoxaparin (LOVENOX) injection  40 mg Subcutaneous Q24H  . famotidine  20 mg Oral BID  . feeding supplement (ENSURE ENLIVE)  237 mL Oral BID BM  . folic acid  1 mg Oral Daily  . ipratropium-albuterol  3 mL Nebulization Q6H  . lisinopril  5 mg Oral Daily  . magnesium oxide  400 mg Oral Daily  . metoprolol tartrate  50 mg Oral BID  . multivitamin with minerals  1 tablet Oral Daily  . predniSONE  40 mg Oral Q breakfast  . rosuvastatin  40 mg Oral q1800  . thiamine  100 mg Oral Daily  . ticagrelor  90 mg Oral BID   Continuous Infusions: . sodium chloride    . sodium chloride      Assessment & Plan:   Active Problems:   Cardiac arrest [facility])   STEMI involving right coronary artery (HCC)   Acute respiratory failure (HCC)   Cocaine use   Severe ACUTE Hypoxic and Hypercapnic Respiratory Failure:  -Resolved now  -S/p extubation As needed oxygen as needed Status post Lasix -Continue  DuoNeb  -Weaning down to room air gradually; currently on 2 L -Was on Solu-Medrol changed to prednisone taper- 13m day 2. -Contto monitorin stepdown unit for now  Inferior STEMI: Recovering. Primary PCI with DES RCA with preserved left ventricular function on dual antiplatelet therapy Cocaine and CAD recent history Follow up Cardiology recs: Continue dual antiplatelet therapy uninterrupted for 1 year, continue statin, Lopressor 50 mg twice daily and lisinopril 5 mg daily -Cardiacrehabmay be helpful -Follow-up with Dr. CClayborn Bignessin 1 to 2 weeks Ok to d/c from cardiac stand point.  Cardiac Arrest-s/p hypothermia protocal Cards following   Electrolyte imbalance:  Found to behypokalemic and hypomagnesemic -Statuspost replacement, resolved -Continue to monitor labs  Drug abuse: Provided counseling -On benzodiazepine as needed with CIWA protocol  delirium from cardiogenic shock: Per record was near death -Recovery slowly -PT OT evaluation-recommends SNF -Dietitian consult and follow recommendation    DVT prophylaxis: lovenox Code Status: Full Family Communication: None at bedside Disposition Plan: SNF pending, d/c when bed available     LOS: 14 days   Time spent: 45 minutes with more than 50% COC     Nolberto Hanlon, MD [facility] Pager [phone]  If 7PM-7AM, please contact night-coverage [email or web address] Password [email or web address] 12/07/2019, 3:30 PM

## 2019-12-07 NOTE — Progress Notes (Signed)
Patient alert, intermittent confusion and forgetfulness. No complaints of pain or shortness of breath. Patient has been incontinent of urine. Patient being transfered to telemtry. Will update son regarding transfer. Report given to [facility].

## 2019-12-08 LAB — CBC WITH DIFFERENTIAL/PLATELET
Abs Immature Granulocytes: 0.14 10*3/uL — ABNORMAL HIGH (ref 0.00–0.07)
Basophils Absolute: 0 10*3/uL (ref 0.0–0.1)
Basophils Relative: 0 %
Eosinophils Absolute: 0.1 10*3/uL (ref 0.0–0.5)
Eosinophils Relative: 1 %
HCT: 33.8 % — ABNORMAL LOW (ref 39.0–52.0)
Hemoglobin: 11.2 g/dL — ABNORMAL LOW (ref 13.0–17.0)
Immature Granulocytes: 1 %
Lymphocytes Relative: 16 %
Lymphs Abs: 2.3 10*3/uL (ref 0.7–4.0)
MCH: 33 pg (ref 26.0–34.0)
MCHC: 33.1 g/dL (ref 30.0–36.0)
MCV: 99.7 fL (ref 80.0–100.0)
Monocytes Absolute: 1.3 10*3/uL — ABNORMAL HIGH (ref 0.1–1.0)
Monocytes Relative: 9 %
Neutro Abs: 10.6 10*3/uL — ABNORMAL HIGH (ref 1.7–7.7)
Neutrophils Relative %: 73 %
Platelets: 271 10*3/uL (ref 150–400)
RBC: 3.39 MIL/uL — ABNORMAL LOW (ref 4.22–5.81)
RDW: 13.2 % (ref 11.5–15.5)
WBC: 14.4 10*3/uL — ABNORMAL HIGH (ref 4.0–10.5)
nRBC: 0 % (ref 0.0–0.2)

## 2019-12-08 LAB — GLUCOSE, CAPILLARY
Glucose-Capillary: 50 mg/dL — ABNORMAL LOW (ref 70–99)
Glucose-Capillary: 94 mg/dL (ref 70–99)
Glucose-Capillary: 88 mg/dL (ref 70–99)
Glucose-Capillary: 105 mg/dL — ABNORMAL HIGH (ref 70–99)
Glucose-Capillary: 163 mg/dL — ABNORMAL HIGH (ref 70–99)
Glucose-Capillary: 101 mg/dL — ABNORMAL HIGH (ref 70–99)

## 2019-12-08 MED ORDER — PREDNISONE 20 MG PO TABS
20.0000 mg | ORAL_TABLET | Freq: Every day | ORAL | Status: DC
  Administered 2019-12-09 – 2019-12-11 (×3): 20 mg via ORAL
  Filled 2019-12-08 (×3): qty 1

## 2019-12-08 MED ORDER — IPRATROPIUM-ALBUTEROL 0.5-2.5 (3) MG/3ML IN SOLN
3.0000 mL | Freq: Two times a day (BID) | RESPIRATORY_TRACT | Status: DC
  Administered 2019-12-08 – 2019-12-09 (×3): 3 mL via RESPIRATORY_TRACT
  Filled 2019-12-08 (×3): qty 3

## 2019-12-08 NOTE — Progress Notes (Signed)
PROGRESS NOTE    Kurt [name]  [id number] DOB: Apr 07, 1956 DOA: 11/23/2019 PCP: Venia Carbon, MD    Brief Narrative:  He was initially admitted to ICU on 12/24 after V-fib cardiac arrest due to STEMI. Status post primary PCI with DES to RCA, now on aspirin and Brilinta, being followed by cardiology. Of note, patient has history of cocaine abuse. Patient was extubated , had some agitation requiring Precedex. This was thought due to ICU delirium and possible withdrawal from cocaine or other substances. He was able to be weaned from [place] and transferred to hospitalist team now.   Consultants:   Cards, pccm  Procedures: CT, ECHo  Antimicrobials:   none   Subjective: Son at bedside.  Patient denies any shortness of breath, or chest pain.  No other complaints.  Objective: Vitals:   12/07/19 2056 12/08/19 0433 12/08/19 0753 12/08/19 0845  BP:  127/86 (!) 143/86   Pulse:  61 63   Resp:  20 19   Temp:  97.6 F (36.4 C) 98.6 F (37 C)   TempSrc:  Oral Oral   SpO2: 98% 96% 93% 96%  Weight:  70.9 kg    Height:        Intake/Output Summary (Last 24 hours) at 12/08/2019 1451 Last data filed at 12/08/2019 0700 Gross per 24 hour  Intake 720 ml  Output 0 ml  Net 720 ml   Filed Weights   12/06/19 0430 12/07/19 0500 12/08/19 0433  Weight: 77.6 kg 79.1 kg 70.9 kg    Examination:  General exam: Appears calm and comfortable , ED sitting in chair Respiratory system: Clear to auscultation. Respiratory effort normal.  No wheeze rales rhonchi's Cardiovascular system: S1 & S2 heard, RRR. No JVD, murmurs, rubs, gallops or clicks. Gastrointestinal system: Abdomen is nondistended, soft and nontender. Normal bowel sounds heard. Central nervous system: Alert and oriented. No focal neurological deficits. Extremities: No edema or cyanosis Psychiatry: Judgement and insight appear normal. Mood & affect appropriate.     Data Reviewed: I have personally reviewed  following labs and imaging studies  CBC: Recent Labs  Lab 12/04/19 0429 12/05/19 0536 12/06/19 0522 12/07/19 0511 12/08/19 0632  WBC 14.0* 16.6* 15.9* 15.1* 14.4*  NEUTROABS 10.5* 12.2* 12.1* 11.0* 10.6*  HGB 12.2* 11.7* 11.3* 11.4* 11.2*  HCT 36.5* 36.2* 34.1* 33.7* 33.8*  MCV 97.3 100.8* 100.0 98.8 99.7  PLT 221 246 249 282 188   Basic Metabolic Panel: Recent Labs  Lab 12/03/19 0510 12/04/19 0429 12/05/19 1024 12/06/19 0522 12/07/19 0511  NA 147* 146* 143 144 143  K 3.4* 3.0* 3.1* 3.5 3.5  CL 111 112* 112* 115* 112*  CO2 _0 GLUCOSE 97 143* 107* 92 95  BUN 28* 27* _1 CREATININE 0.70 0.79 0.74 0.83 0.88  CALCIUM 8.2* 8.4* 8.1* 8.1* 8.4*  MG 2.1 2.0 1.8  --   --   PHOS 3.1  --   --   --   --    GFR: Estimated Creatinine Clearance: 88.2 mL/min (by C-G formula based on SCr of 0.88 mg/dL). Liver Function Tests: No results for input(s): AST, ALT, ALKPHOS, BILITOT, PROT, ALBUMIN in the last 168 hours. No results for input(s): LIPASE, AMYLASE in the last 168 hours. No results for input(s): AMMONIA in the last 168 hours. Coagulation Profile: No results for input(s): INR, PROTIME in the last 168 hours. Cardiac Enzymes: Recent Labs  Lab 12/03/19 1922  CKMB 2.6   BNP (last 3  results) No results for input(s): PROBNP in the last 8760 hours. HbA1C: No results for input(s): HGBA1C in the last 72 hours. CBG: Recent Labs  Lab 12/07/19 2028 12/08/19 0634 12/08/19 0637 12/08/19 0800 12/08/19 1148  GLUCAP 107* 50* 94 88 105*   Lipid Profile: No results for input(s): CHOL, HDL, LDLCALC, TRIG, CHOLHDL, LDLDIRECT in the last 72 hours. Thyroid Function Tests: No results for input(s): TSH, T4TOTAL, FREET4, T3FREE, THYROIDAB in the last 72 hours. Anemia Panel: No results for input(s): VITAMINB12, FOLATE, FERRITIN, TIBC, IRON, RETICCTPCT in the last 72 hours. Sepsis Labs: No results for input(s): PROCALCITON, LATICACIDVEN in the last 168 hours.   Recent Results (from the past 240 hour(s))  Culture, respiratory (non-expectorated)     Status: None   Collection Time: 12/01/19  1:24 PM   Specimen: Tracheal Aspirate; Respiratory  Result Value Ref Range Status   Specimen Description   Final    TRACHEAL ASPIRATE Performed at [facility], [address], [place], [place] [address]    Special Requests   Final    NONE Performed at [facility], [address] [place], [place] [address]    Gram Stain   Final    RARE WBC PRESENT, PREDOMINANTLY PMN MODERATE SQUAMOUS EPITHELIAL CELLS PRESENT ABUNDANT YEAST RARE GRAM POSITIVE COCCI    Culture   Final    FEW CANDIDA TROPICALIS Standardized susceptibility testing for this organism is not available. Performed at [facility], [place] [address] [place], [place] [address]    Report Status 12/04/2019 FINAL  Final         Radiology Studies: No results found.      Scheduled Meds: . aspirin  81 mg Oral Daily  . chlorhexidine gluconate (MEDLINE KIT)  15 mL Mouth Rinse BID  . Chlorhexidine Gluconate Cloth  6 each Topical Daily  . enoxaparin (LOVENOX) injection  40 mg Subcutaneous Q24H  . famotidine  20 mg Oral BID  . feeding supplement (ENSURE ENLIVE)  237 mL Oral BID BM  . folic acid  1 mg Oral Daily  . ipratropium-albuterol  3 mL Nebulization BID  . lisinopril  5 mg Oral Daily  . magnesium oxide  400 mg Oral Daily  . metoprolol tartrate  50 mg Oral BID  . multivitamin with minerals  1 tablet Oral Daily  . predniSONE  40 mg Oral Q breakfast  . rosuvastatin  40 mg Oral q1800  . thiamine  100 mg Oral Daily  . ticagrelor  90 mg Oral BID   Continuous Infusions: . sodium chloride    . sodium chloride      Assessment & Plan:   Active Problems:   Cardiac arrest [facility])   STEMI involving right coronary artery (HCC)   Acute respiratory failure (HCC)   Cocaine use   Severe ACUTE Hypoxic and Hypercapnic Respiratory Failure:  -Resolved now  -S/p  extubation As needed oxygen as needed Status post Lasix -Continue DuoNeb  -Weaning down to room air gradually; currently on 2 L -Was on Solu-Medrol changed to prednisone taper- will decrease prednisone from 34m to 217mdaily x3 starting tomorrow   Inferior STEMI: Recovering. Primary PCI with DES RCA with preserved left ventricular function on dual antiplatelet therapy Cocaine and CAD recent history Follow up Cardiology recs: Continue dual antiplatelet therapy uninterrupted for 1 year, continue statin, Lopressor 50 mg twice daily and lisinopril 5 mg daily -Cardiacrehabmay be helpful -Follow-up with Dr. CaClayborn Bignessn 1 to 2 weeks [place] d/c from  cardiac stand point.  Cardiac Arrest-s/p hypothermia protocal Cards following   Electrolyte imbalance: Found to behypokalemic and hypomagnesemic -Statuspost replacement, resolved -Continue to monitor labs  Drug abuse: Provided counseling -On benzodiazepine as needed with CIWA protocol  delirium from cardiogenic shock: Per record was near death -Recovery slowly -PT OT evaluation-recommends SNF -Dietitian consult and follow recommendation    DVT prophylaxis: lovenox Code Status: Full Family Communication: None at bedside Disposition Plan: SNF pending, d/c when bed available, case management are working on this     LOS: 15 days   Time spent: 45 minutes with more than 50% COC     Nolberto Hanlon, MD [facility] Pager [phone]  If 7PM-7AM, please contact night-coverage [email or web address] Password [facility] 12/08/2019, 2:51 PM Patient ID: Kurt [name], male   DOB: [birth year], 64 y.o.   MRN: [id number]

## 2019-12-08 NOTE — TOC Progression Note (Signed)
Transition of Care [facility]) - Progression Note    Patient Details  Name: Kurt [name] MRN: [id number] Date of Birth: 1956/06/13  Transition of Care [facility]) CM/SW [place], RN Phone Number: 12/08/2019, 1:02 PM  Clinical Narrative:     Met with patient and son today.  They are both agreeable to SNF placement.  Bed Search Started   Expected Discharge Plan: [place] Barriers to Discharge: Continued Medical Work up  Expected Discharge Plan and Services Expected Discharge Plan: [place]   Discharge Planning Services: CM Consult   Living arrangements for the past 2 months: [place]                   DME Agency: [facility], [place]                   Social Determinants of Health (SDOH) Interventions    Readmission Risk Interventions No flowsheet data found.

## 2019-12-08 NOTE — Progress Notes (Signed)
Physical Therapy Treatment Patient Details Name: Kurt [name] MRN: [id number] DOB: 12-21-1955 Today's Date: 12/08/2019    History of Present Illness Pt is a 64 y.o. male found down on scene with lit cigarette.  Pt noted with cardiac arrest; intubated and s/p cath 12/24.  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    PT Comments    Pt received sitting EOB with family present and eager for PT. Family member present reports pt has been up walking within room throughout the day sometimes not using RW. Pt performed seated LE therex with min cuing for correct performance. Pt requesting to ambulate in hall. Pt performed STS transfer from low bed without AD with min guard assist. Mild unsteadiness noted during transfer with pt reaching out for counter top at sink. Pt provided with RW for improved stability. Pt ambulated in hall with HR monitored closely throughout. HR during ambulation 88-93. No complaints of SOB, no visible DOE during ambulation and pt able to maintain conversation with therapist and family member throughout session. Pt ambulates with decreased foot clearance bilaterally often mostly dragging/shuffling feet with cuing provided for increasing clearance in order for improved gait pattern and decreased fall risk. Pt presents with improved cognitive functioning this date however does present with decreased awareness of deficits/safety. Pt is progressing well towards PT goals. Depending on continued progression of functional mobility and support at home pt may be able to d/c home with home health. PT will continue to assess for discharge recommendations. Pt will continue to benefit from acute PT to continue progressing functional mobility to allow for return to  [facility].    Follow Up Recommendations  SNF     Equipment Recommendations  Rolling walker with 5" wheels;Wheelchair (measurements PT)    Recommendations for Other Services       Precautions / Restrictions Precautions Precautions: Fall Restrictions Weight Bearing Restrictions: No Other Position/Activity Restrictions: monitor HR and O2    Mobility  Bed Mobility               General bed mobility comments: pt received sitting up EOB and ended session same way  Transfers Overall transfer level: Needs assistance Equipment used: None Transfers: Sit to/from Stand Sit to Stand: Min guard         General transfer comment: min guard for safety, pt able to stand from low bed without physical assist, minor unsteadiness with pt reaching for counter top at sink therefore provided with walker for improved safety with ambulation, family member present reporting pt has been walking within room throughout the day with and without walker  Ambulation/Gait Ambulation/Gait assistance: Min guard Gait Distance (Feet): 125 Feet Assistive device: Rolling walker (2 wheeled) Gait Pattern/deviations: Step-through pattern;Decreased stride length Gait velocity: decreased   General Gait Details: pt progressed to ambulating in hall with RW, pt ambulates with decreased foot clearance B with cuing required for increasing clearance/step height, steady with RW, periods of light min A for RW mgt around obstacles in room   Stairs             Wheelchair Mobility    Modified Rankin (Stroke Patients Only)       Balance Overall balance assessment: Needs assistance Sitting-balance support: No upper extremity supported;Feet supported Sitting balance-Leahy Scale: Good Sitting balance - Comments: steady static sitting EOB  and during LE therex   Standing balance support: Bilateral upper extremity supported;During functional activity Standing balance-Leahy Scale: Fair Standing balance comment:  reliant on RW to steady during ambulation                            Cognition Arousal/Alertness: Awake/alert Behavior During Therapy: Flat affect Overall Cognitive Status: Impaired/Different from baseline Area of Impairment: Attention;Safety/judgement                   Current Attention Level: Sustained     Safety/Judgement: Decreased awareness of safety;Decreased awareness of deficits            Exercises Total Joint Exercises Long Arc Quad: AROM;Both;10 reps Marching in Standing: AROM;Both;10 reps    General Comments General comments (skin integrity, edema, etc.): vitals monitored t/o, no reports of SOB and no visible DOE during ambulation, HR during ambulation between 88-93      Pertinent Vitals/Pain Pain Assessment: No/denies pain    Home Living                      Prior Function            PT Goals (current goals can now be found in the care plan section) Acute Rehab PT Goals Patient Stated Goal: to be able to walk again Progress towards PT goals: Progressing toward goals    Frequency    Min 2X/week      PT Plan Current plan remains appropriate    Co-evaluation              AM-PAC PT "6 Clicks" Mobility   Outcome Measure  Help needed turning from your back to your side while in a flat bed without using bedrails?: A Little Help needed moving from lying on your back to sitting on the side of a flat bed without using bedrails?: A Lot Help needed moving to and from a bed to a chair (including a wheelchair)?: A Lot Help needed standing up from a chair using your arms (e.g., wheelchair or bedside chair)?: A Little Help needed to walk in hospital room?: A Little Help needed climbing 3-5 steps with a railing? : A Lot 6 Click Score: 15    End of Session Equipment Utilized During Treatment: Gait belt Activity Tolerance: Patient tolerated treatment well Patient left: in bed;with call bell/phone within reach;with  family/visitor present(sitting EOB) Nurse Communication: Mobility status PT Visit Diagnosis: Other abnormalities of gait and mobility (R26.89);Muscle weakness (generalized) (M62.81);Difficulty in walking, not elsewhere classified (R26.2)     Time: [id number] PT Time Calculation (min) (ACUTE ONLY): 15 min  Charges:  $Therapeutic Exercise: 8-22 mins                     Zachary George PT, DPT 3:37 PM,12/08/19 [phone]    Chieko Neises Drucilla Chalet 12/08/2019, 3:32 PM

## 2019-12-08 NOTE — Progress Notes (Signed)
Occupational Therapy Treatment Patient Details Name: Kurt [name] MRN: [id number] DOB: [birth year] Today's Date: 12/08/2019    History of present illness Pt is a 64 y.o. male found down on scene with lit cigarette.  Pt noted with cardiac arrest; intubated and s/p cath 12/24.  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   OT comments  Pt seen for OT tx this date to f/u re: safety with self care ADLs and ADL mobility. Pt demos improved cognition and fxl activity tolerance this session. Pt performs ADL transfers with MIN A, fxl mobility with RW w/in the room (to/from restroom) with MIN A to CGA. Pt demos F+ static standing balance during sink-side standing grooming tasks and with fxl mobility. Some elevation in HR (not noted to go over 100bpm with normal activity however) and some elevation in RR (~26-27 breaths per minute with activity). While pt is improving, SNF remains most appropriate d/c dispo at this time for pt safety.    Follow Up Recommendations  SNF    Equipment Recommendations  3 in 1 bedside commode;Tub/shower seat(2WW)    Recommendations for Other Services      Precautions / Restrictions Precautions Precautions: Fall Restrictions Weight Bearing Restrictions: No Other Position/Activity Restrictions: monitor HR and O2       Mobility Bed Mobility Overal bed mobility: Needs Assistance Bed Mobility: Supine to Sit     Supine to sit: Min assist        Transfers Overall transfer level: Needs assistance Equipment used: Rolling walker (2 wheeled) Transfers: Sit to/from Stand Sit to Stand: Min assist         General transfer comment: Pt with improved transfer tolerance and stability this date. Required MIN verbal/visual cues for safe  hand placement with sit<>stand to push from bed or chair and reach back to control descent.    Balance   Sitting-balance support: No upper extremity supported;Feet supported Sitting balance-Leahy Scale: Good     Standing balance support: Bilateral upper extremity supported Standing balance-Leahy Scale: Fair Standing balance comment: MIN A and support through RW with B UEs.                           ADL either performed or assessed with clinical judgement   ADL Overall ADL's : Needs assistance/impaired     Grooming: Wash/dry hands;Oral care;Minimal assistance;Standing Grooming Details (indicate cue type and reason): MIN A for standing balance sink-side with RW, setup for actual task. Pt requires seated rest break 2-3 mins between tasks. Tolerates standing ~2 mins at a time.             Lower Body Dressing: Minimal assistance;Sit to/from stand Lower Body Dressing Details (indicate cue type and reason): Pt able to don socks in sitting with setup, requires MIN A for standing balance to simulate clothing mgt over hips with RW Toilet Transfer: Minimal assistance;Ambulation;RW;Grab bars;Regular Toilet   Toileting- Clothing Manipulation and Hygiene: Supervision/safety;Sitting/lateral lean;Minimal assistance;Sit to/from stand Toileting - Clothing Manipulation Details (indicate cue type and reason): supv for sit/lateral lean for peri care, MIN A for standing clothing mgt.     Functional mobility during ADLs: Min guard;Minimal assistance;Rolling walker(Pt initially requires MIN A with RW for fxl mobility to restroom, Demos F+ balance with fxl activity with RW, able to  complete remainder of fxl mobility with CGA. Requires 3 seated rest breaks during fxl mobility for 2-3 mins each. Tolerates 1-59min stand)       Vision Patient Visual Report: No change from baseline     Perception     Praxis      Cognition Arousal/Alertness: Awake/alert Behavior During Therapy: Flat  affect Overall Cognitive Status: Impaired/Different from baseline Area of Impairment: Attention;Safety/judgement(much improved this date, some lack of insight into deficits.)                 Orientation Level: (oriented this date.) Current Attention Level: Sustained Memory: Decreased recall of precautions Following Commands: Follows one step commands consistently;Follows one step commands with increased time Safety/Judgement: Decreased awareness of safety;Decreased awareness of deficits     General Comments: much improved mental status this date. Still with some impulsivity, but overall-improved command following and orientation.        Exercises Other Exercises Other Exercises: OT facilitates education re: safety/fall prevention including notifying pt of fall mats in place, chair alarm in place, use of call bell. Pt much more receptive this date. Other Exercises: OT facilitates education re: safe hand placement with use of RW. Pt demos moderate reception of education   Shoulder Instructions       General Comments      Pertinent Vitals/ Pain       Pain Assessment: No/denies pain  Home Living                                          Prior Functioning/Environment              Frequency  Min 2X/week        Progress Toward Goals  OT Goals(current goals can now be found in the care plan section)  Progress towards OT goals: Progressing toward goals  Acute Rehab OT Goals Patient Stated Goal: to be able to walk again OT Goal Formulation: With patient/family Time For Goal Achievement: 12/17/19  Plan Discharge plan remains appropriate;Frequency remains appropriate    Co-evaluation                 AM-PAC OT "6 Clicks" Daily Activity     Outcome Measure   Help from another person eating meals?: None Help from another person taking care of personal grooming?: A Little Help from another person toileting, which includes using toliet,  bedpan, or urinal?: A Little Help from another person bathing (including washing, rinsing, drying)?: A Little Help from another person to put on and taking off regular upper body clothing?: None Help from another person to put on and taking off regular lower body clothing?: A Little 6 Click Score: 20    End of Session Equipment Utilized During Treatment: Gait belt;Rolling walker  OT Visit Diagnosis: Unsteadiness on feet (R26.81);Muscle weakness (generalized) (M62.81)   Activity Tolerance Patient tolerated treatment well   Patient Left in chair;with call bell/phone within reach;with chair alarm set   Nurse Communication Mobility status        Time: [id number] OT Time Calculation (min): 52 min  Charges: OT General Charges $OT Visit: 1 Visit OT Treatments $Self Care/Home Management : 23-37 mins $Therapeutic Activity: 8-22 mins  Gerrianne Scale, MS, OTR/L ascom [phone] 12/08/19, 12:10 PM

## 2019-12-08 NOTE — NC FL2 (Signed)
[place] LEVEL OF CARE SCREENING TOOL     IDENTIFICATION  Patient Name: Kurt [name] Birthdate: Sep 08, 1956 Sex: male Admission Date (Current Location): 11/23/2019  [place] and [place] Number:  Engineering geologist and Address:  [facility], [address], [place], [place] [address]      Provider Number: [id number]  Attending Physician Name and Address:  Nolberto Hanlon, MD  Relative Name and Phone Number:  Shelva Majestic  [phone]    Current Level of Care: Hospital Recommended Level of Care: [place] Prior Approval Number:    Date Approved/Denied:   PASRR Number: [id number]  Discharge Plan: SNF    Current Diagnoses: Patient Active Problem List   Diagnosis Date Noted  . Acute respiratory failure ([place])   . Cocaine use   . Cardiac arrest ([place]) 11/23/2019  . STEMI involving right coronary artery ([place]) 11/23/2019  . Acute exacerbation of chronic bronchitis ([place]) 03/16/2018  . Acute back pain with sciatica, right 07/26/2017  . GERD (gastroesophageal reflux disease) 11/24/2016  . Routine general medical examination at a health care facility 06/12/2014  . Hypertension   . Lumbar back pain with radiculopathy affecting left lower extremity 06/10/2011  . 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 [place] DISEASE, LUMBAR 09/01/2007    Orientation RESPIRATION BLADDER Height & Weight     Self, Time, Situation, Place  Normal Continent Weight: 70.9 kg Height:  '5\' 9"'  (175.3 cm)  BEHAVIORAL SYMPTOMS/MOOD NEUROLOGICAL BOWEL NUTRITION STATUS      Continent Diet(heart healthy)  AMBULATORY STATUS COMMUNICATION OF NEEDS Skin   Extensive Assist Verbally Skin abrasions(staples in forehead)                       Personal Care Assistance Level of Assistance  Dressing, Bathing Bathing Assistance: Limited assistance   Dressing Assistance: Maximum assistance     Functional Limitations Info  Hearing, Speech    Hearing Info: Adequate Speech Info: Adequate    SPECIAL CARE FACTORS FREQUENCY  PT (By licensed PT), OT (By licensed OT)     PT Frequency: 5x a week OT Frequency: 5x a week            Contractures Contractures Info: Not present    Additional Factors Info  Code Status Code Status Info: Full             Current Medications (12/08/2019):  This is the current hospital active medication list Current Facility-Administered Medications  Medication Dose Route Frequency Provider Last Rate Last Admin  . 0.9 %  sodium chloride infusion   Intravenous Continuous Vanessa Sturgis, MD      . 0.9 %  sodium chloride infusion   Intravenous Continuous Flora Lipps, MD      . acetaminophen (TYLENOL) tablet 650 mg  650 mg Oral Q4H PRN Flora Lipps, MD   650 mg at 12/05/19 0437  . acetaminophen (TYLENOL) tablet 650 mg  650 mg Oral Q4H PRN Callwood, Dwayne D, MD   650 mg at 12/03/19 2117  . albuterol (PROVENTIL) (2.5 MG/3ML) 0.083% nebulizer solution 2.5 mg  2.5 mg Nebulization Q2H PRN Flora Lipps, MD      . aspirin chewable tablet 81 mg  81 mg Oral Daily Paraschos, [place], MD   81 mg at 12/08/19 0813  . atropine 1 MG/10ML injection 1 mg  1 mg Intravenous PRN Awilda Bill, NP      . chlorhexidine gluconate (MEDLINE KIT) (PERIDEX) 0.12 % solution  15 mL  15 mL Mouth Rinse BID [name] D, NP   15 mL at 12/08/19 0015  . Chlorhexidine Gluconate Cloth 2 % PADS 6 each  6 each Topical Daily Ottie Glazier, MD   6 each at 12/06/19 2125  . enoxaparin (LOVENOX) injection 40 mg  40 mg Subcutaneous Q24H Ottie Glazier, MD   40 mg at 12/08/19 5170  . famotidine (PEPCID) tablet 20 mg  20 mg Oral BID Dallie Piles, RPH   20 mg at 12/08/19 0813  . feeding supplement (ENSURE ENLIVE) (ENSURE ENLIVE) liquid 237 mL  237 mL Oral BID BM Thornell Mule, MD   237 mL at 12/06/19 1350  . folic acid (FOLVITE) tablet 1 mg  1 mg Oral Daily Dallie Piles, RPH   1 mg at 12/08/19 0174  . glycopyrrolate (ROBINUL)  injection 0.2 mg  0.2 mg Intravenous Q4H PRN Ottie Glazier, MD   0.2 mg at 12/01/19 1320  . hydrALAZINE (APRESOLINE) injection 10 mg  10 mg Intravenous Q6H PRN [name] D, NP   10 mg at 12/06/19 0546  . ipratropium-albuterol (DUONEB) 0.5-2.5 (3) MG/3ML nebulizer solution 3 mL  3 mL Nebulization BID Nolberto Hanlon, MD   3 mL at 12/08/19 0843  . lisinopril (ZESTRIL) tablet 5 mg  5 mg Oral Daily Paraschos, [place], MD   5 mg at 12/08/19 0813  . LORazepam (ATIVAN) tablet 1-4 mg  1-4 mg Oral Q1H PRN [name], NP       Or  . LORazepam (ATIVAN) injection 1-4 mg  1-4 mg Intravenous Q1H PRN [name] D, NP   2 mg at 12/06/19 0130  . magnesium oxide (MAG-OX) tablet 400 mg  400 mg Oral Daily Nolberto Hanlon, MD   400 mg at 12/08/19 0813  . metoprolol tartrate (LOPRESSOR) tablet 50 mg  50 mg Oral BID Isaias Cowman, MD   50 mg at 12/08/19 0814  . multivitamin with minerals tablet 1 tablet  1 tablet Oral Daily Thornell Mule, MD   1 tablet at 12/08/19 [phone]  . ondansetron (ZOFRAN) injection 4 mg  4 mg Intravenous Q6H PRN Callwood, Dwayne D, MD      . polyvinyl alcohol (LIQUIFILM TEARS) 1.4 % ophthalmic solution 1 drop  1 drop Both Eyes PRN Nolberto Hanlon, MD      . predniSONE (DELTASONE) tablet 40 mg  40 mg Oral Q breakfast Thornell Mule, MD   40 mg at 12/08/19 0814  . rosuvastatin (CRESTOR) tablet 40 mg  40 mg Oral q1800 Paraschos, [place], MD   40 mg at 12/07/19 1856  . sodium chloride flush (NS) 0.9 % injection 3 mL  3 mL Intravenous PRN Callwood, Dwayne D, MD   3 mL at 11/30/19 0931  . thiamine tablet 100 mg  100 mg Oral Daily Awilda Bill, NP   100 mg at 12/08/19 0813  . ticagrelor (BRILINTA) tablet 90 mg  90 mg Oral BID Isaias Cowman, MD   90 mg at 12/08/19 6759     Discharge Medications: Please see discharge summary for a list of discharge medications.  Relevant Imaging Results:  Relevant Lab Results:   Additional Information    Victorino Dike,  RN

## 2019-12-08 NOTE — TOC Initial Note (Signed)
Transition of Care [facility]) - Initial/Assessment Note    Patient Details  Name: Ronne Stefanski MRN: [id number] Date of Birth: Feb 06, 1956  [facility] CM/SW Contact:    Victorino Dike, RN Phone Number: 12/08/2019, 9:23 AM  Clinical Narrative:                   Expected Discharge Plan: [place] Barriers to Discharge: Continued Medical Work up   Patient Goals and CMS Choice        Expected Discharge Plan and Services Expected Discharge Plan: [place]   Discharge Planning Services: CM Consult   Living arrangements for the past 2 months: [place]                   DME Agency: [facility]                  Prior Living Arrangements/Services Living arrangements for the past 2 months: [place] Lives with:: Self Patient language and need for interpreter reviewed:: Yes Do you feel safe going back to the place where you live?: Yes      Need for Family Participation in Patient Care: Yes (Comment) Care giver support system in place?: Yes (comment)   Criminal Activity/Legal Involvement Pertinent to Current Situation/Hospitalization: No - Comment as needed  Activities of Daily Living      Permission Sought/Granted                  Emotional Assessment Appearance:: Appears older than stated age Attitude/Demeanor/Rapport: Engaged Affect (typically observed): Pleasant Orientation: : Oriented to Self, Oriented to Place, Oriented to Situation Alcohol / Substance Use: Not Applicable Psych Involvement: No (comment)  Admission diagnosis:  Cardiac arrest ([place]) [I46.9] ST elevation myocardial infarction involving right coronary artery ([place]) [I21.11] Cocaine use [F14.90] STEMI involving right coronary artery ([place]) [I21.11] Patient Active Problem List   Diagnosis Date Noted  . Acute respiratory failure ([place])   . Cocaine use   . Cardiac arrest ([place]) 11/23/2019  . STEMI  involving right coronary artery ([place]) 11/23/2019  . Acute exacerbation of chronic bronchitis ([place]) 03/16/2018  . Acute back pain with sciatica, right 07/26/2017  . GERD (gastroesophageal reflux disease) 11/24/2016  . Routine general medical examination at a health care facility 06/12/2014  . Hypertension   . Lumbar back pain with radiculopathy affecting left lower extremity 06/10/2011  . COPD with chronic bronchitis ([place]) 09/01/2007  . [place] DISEASE, LUMBAR 09/01/2007   PCP:  Venia Carbon, MD Pharmacy:   [facility] [address] [place] [address] [place] [address] Phone: [phone] Fax: [phone]     Social Determinants of Health (SDOH) Interventions    Readmission Risk Interventions No flowsheet data found.   Met with patient to discuss discharge planning, recommendation for continued therapy at a skilled nursing facility.  Patient was pleasant and agreeable, although patient is with some confusion noted after several minutes of conversation.  Patient stated I could talk with his son about care.  Niece is listed as emergency contact, called niece.  Reported son is in [place] in [place], but he his home in [place] at this time, but his phone does not work here.  His niece is contacting him to return my phone call.

## 2019-12-09 LAB — CBC WITH DIFFERENTIAL/PLATELET
Abs Immature Granulocytes: 0.1 10*3/uL — ABNORMAL HIGH (ref 0.00–0.07)
Basophils Absolute: 0 10*3/uL (ref 0.0–0.1)
Basophils Relative: 0 %
Eosinophils Absolute: 0.1 10*3/uL (ref 0.0–0.5)
Eosinophils Relative: 1 %
HCT: 33.2 % — ABNORMAL LOW (ref 39.0–52.0)
Hemoglobin: 10.8 g/dL — ABNORMAL LOW (ref 13.0–17.0)
Immature Granulocytes: 1 %
Lymphocytes Relative: 23 %
Lymphs Abs: 2.7 10*3/uL (ref 0.7–4.0)
MCH: 32.5 pg (ref 26.0–34.0)
MCHC: 32.5 g/dL (ref 30.0–36.0)
MCV: 100 fL (ref 80.0–100.0)
Monocytes Absolute: 1.1 10*3/uL — ABNORMAL HIGH (ref 0.1–1.0)
Monocytes Relative: 10 %
Neutro Abs: 7.4 10*3/uL (ref 1.7–7.7)
Neutrophils Relative %: 65 %
Platelets: 265 10*3/uL (ref 150–400)
RBC: 3.32 MIL/uL — ABNORMAL LOW (ref 4.22–5.81)
RDW: 13 % (ref 11.5–15.5)
WBC: 11.3 10*3/uL — ABNORMAL HIGH (ref 4.0–10.5)
nRBC: 0 % (ref 0.0–0.2)

## 2019-12-09 LAB — GLUCOSE, CAPILLARY
Glucose-Capillary: 94 mg/dL (ref 70–99)
Glucose-Capillary: 90 mg/dL (ref 70–99)
Glucose-Capillary: 99 mg/dL (ref 70–99)
Glucose-Capillary: 102 mg/dL — ABNORMAL HIGH (ref 70–99)
Glucose-Capillary: 124 mg/dL — ABNORMAL HIGH (ref 70–99)

## 2019-12-09 MED ORDER — NICOTINE 21 MG/24HR TD PT24
21.0000 mg | MEDICATED_PATCH | Freq: Every day | TRANSDERMAL | Status: DC
  Administered 2019-12-09 – 2019-12-11 (×2): 21 mg via TRANSDERMAL
  Filled 2019-12-09 (×3): qty 1

## 2019-12-09 MED ORDER — IPRATROPIUM-ALBUTEROL 0.5-2.5 (3) MG/3ML IN SOLN: 3.0000 mL | Freq: Four times a day (QID) | RESPIRATORY_TRACT | Status: DC | PRN

## 2019-12-09 NOTE — Progress Notes (Signed)
PROGRESS NOTE    Kurt [name]  HQP:[id number] DOB: 05-Jun-1956 DOA: 11/23/2019 PCP: Venia Carbon, MD    Brief Narrative:  He was initially admitted to ICU on 12/24 after V-fib cardiac arrest due to STEMI. Status post primary PCI with DES to RCA, now on aspirin and Brilinta, being followed by cardiology. Of note, patient has history of cocaine abuse. Patient was extubated , had some agitation requiring Precedex. This was thought due to ICU delirium and possible withdrawal from cocaine or other substances. He was able to be weaned from [place] and transferred to hospitalist team now.   Consultants:   Cards, pccm  Procedures: CT, ECHo  Antimicrobials:   none   Subjective: Pt without cp, sob, or any other complaints. Objective: Vitals:   12/08/19 2310 12/09/19 0541 12/09/19 0745 12/09/19 0825  BP:  110/70 118/85   Pulse: (!) 55 61 (!) 53   Resp:  20 20   Temp:  98.2 F (36.8 C) 98.9 F (37.2 C)   TempSrc:  Oral Oral   SpO2:  95% 94% 94%  Weight:  72.1 kg    Height:        Intake/Output Summary (Last 24 hours) at 12/09/2019 0943 Last data filed at 12/09/2019 0541 Gross per 24 hour  Intake 0 ml  Output 750 ml  Net -750 ml   Filed Weights   12/07/19 0500 12/08/19 0433 12/09/19 0541  Weight: 79.1 kg 70.9 kg 72.1 kg    Examination:  General exam: Appears calm and comfortable ,  nad Respiratory system: Clear to auscultation.  No wheeze rales rhonchi's Cardiovascular system: S1 & S2 heard, RRR. No JVD, murmurs, rubs, gallops or clicks. Gastrointestinal system: Abdomen is nondistended, soft and nontender. Normal bowel sounds heard. Central nervous system: Alert and oriented. No focal neurological deficits.response is appropriate Extremities: No edema or cyanosis Psychiatry: Judgement and insight appear normal. Mood & affect appropriate.     Data Reviewed: I have personally reviewed following labs and imaging studies  CBC: Recent Labs  Lab  12/05/19 0536 12/06/19 0522 12/07/19 0511 12/08/19 0632 12/09/19 0549  WBC 16.6* 15.9* 15.1* 14.4* 11.3*  NEUTROABS 12.2* 12.1* 11.0* 10.6* 7.4  HGB 11.7* 11.3* 11.4* 11.2* 10.8*  HCT 36.2* 34.1* 33.7* 33.8* 33.2*  MCV 100.8* 100.0 98.8 99.7 100.0  PLT 246 249 282 271 599   Basic Metabolic Panel: Recent Labs  Lab 12/03/19 0510 12/04/19 0429 12/05/19 1024 12/06/19 0522 12/07/19 0511  NA 147* 146* 143 144 143  K 3.4* 3.0* 3.1* 3.5 3.5  CL 111 112* 112* 115* 112*  CO2 '28 26 22 23 23  ' GLUCOSE 97 143* 107* 92 95  BUN 28* 27* '21 23 20  ' CREATININE 0.70 0.79 0.74 0.83 0.88  CALCIUM 8.2* 8.4* 8.1* 8.1* 8.4*  MG 2.1 2.0 1.8  --   --   PHOS 3.1  --   --   --   --    GFR: Estimated Creatinine Clearance: 88.2 mL/min (by C-G formula based on SCr of 0.88 mg/dL). Liver Function Tests: No results for input(s): AST, ALT, ALKPHOS, BILITOT, PROT, ALBUMIN in the last 168 hours. No results for input(s): LIPASE, AMYLASE in the last 168 hours. No results for input(s): AMMONIA in the last 168 hours. Coagulation Profile: No results for input(s): INR, PROTIME in the last 168 hours. Cardiac Enzymes: Recent Labs  Lab 12/03/19 1922  CKMB 2.6   BNP (last 3 results) No results for input(s): PROBNP in the last 8760 hours. HbA1C:  No results for input(s): HGBA1C in the last 72 hours. CBG: Recent Labs  Lab 12/08/19 1148 12/08/19 1536 12/08/19 2032 12/09/19 0558 12/09/19 0748  GLUCAP 105* 163* 101* 94 90   Lipid Profile: No results for input(s): CHOL, HDL, LDLCALC, TRIG, CHOLHDL, LDLDIRECT in the last 72 hours. Thyroid Function Tests: No results for input(s): TSH, T4TOTAL, FREET4, T3FREE, THYROIDAB in the last 72 hours. Anemia Panel: No results for input(s): VITAMINB12, FOLATE, FERRITIN, TIBC, IRON, RETICCTPCT in the last 72 hours. Sepsis Labs: No results for input(s): PROCALCITON, LATICACIDVEN in the last 168 hours.  Recent Results (from the past 240 hour(s))  Culture, respiratory  (non-expectorated)     Status: None   Collection Time: 12/01/19  1:24 PM   Specimen: Tracheal Aspirate; Respiratory  Result Value Ref Range Status   Specimen Description   Final    TRACHEAL ASPIRATE Performed at [facility], [address] [place], [place] [address]    Special Requests   Final    NONE Performed at [facility], [address] [place], [place] [address]    Gram Stain   Final    RARE WBC PRESENT, PREDOMINANTLY PMN MODERATE SQUAMOUS EPITHELIAL CELLS PRESENT ABUNDANT YEAST RARE GRAM POSITIVE COCCI    Culture   Final    FEW CANDIDA TROPICALIS Standardized susceptibility testing for this organism is not available. Performed at [facility], [place] [address] [place],  [address]    Report Status 12/04/2019 FINAL  Final         Radiology Studies: No results found.      Scheduled Meds: . aspirin  81 mg Oral Daily  . chlorhexidine gluconate (MEDLINE KIT)  15 mL Mouth Rinse BID  . Chlorhexidine Gluconate Cloth  6 each Topical Daily  . enoxaparin (LOVENOX) injection  40 mg Subcutaneous Q24H  . famotidine  20 mg Oral BID  . feeding supplement (ENSURE ENLIVE)  237 mL Oral BID BM  . folic acid  1 mg Oral Daily  . ipratropium-albuterol  3 mL Nebulization BID  . lisinopril  5 mg Oral Daily  . magnesium oxide  400 mg Oral Daily  . metoprolol tartrate  50 mg Oral BID  . multivitamin with minerals  1 tablet Oral Daily  . predniSONE  20 mg Oral Q breakfast  . rosuvastatin  40 mg Oral q1800  . thiamine  100 mg Oral Daily  . ticagrelor  90 mg Oral BID   Continuous Infusions: . sodium chloride    . sodium chloride      Assessment & Plan:   Active Problems:   Cardiac arrest [facility])   STEMI involving right coronary artery (HCC)   Acute respiratory failure (HCC)   Cocaine use   Severe ACUTE Hypoxic and Hypercapnic Respiratory Failure:  -Resolved now  -S/p extubation As needed oxygen as needed Status post Lasix -Continue  DuoNeb  -Weaning down to room air gradually; currently on 2 L -Was on Solu-Medrol changed to prednisone taper- prednisone was decreased from 32m to 273mdaily x3 to start today, then stop.   Inferior STEMI:s/p. Primary PCI with DES RCA with preserved left ventricular function on dual antiplatelet therapy Recent hx.o Cocaine  Follow up Cardiology recs: Continue dual antiplatelet therapy uninterrupted for 1 year, continue statin, Lopressor 50 mg twice daily and lisinopril 5 mg daily -Cardiacrehabmay be helpful -Follow-up with Dr. CaClayborn Bignessn 1 to 2 weeks Ok to d/c from cardiac stand point.  Cardiac Arrest-s/p hypothermia protocal Cards following. See  above   Electrolyte imbalance: Found to behypokalemic and hypomagnesemic -Statuspost replacement, resolved   Drug abuse: Provided counseling -On benzodiazepine as needed with CIWA protocol  delirium from cardiogenic shock: Per record was near death -improved -PT OT evaluation-recommends SNF     DVT prophylaxis: lovenox Code Status: Full Family Communication: None at bedside Disposition Plan: SNF pending, d/c when bed available, case management are working on this     LOS: 16 days   Time spent: 45 minutes with more than 50% COC     Nolberto Hanlon, MD [facility] Pager [phone]  If 7PM-7AM, please contact night-coverage [email or web address] Password [facility] 12/09/2019, 9:43 AM Patient ID: Kurt [name], male   DOB: [birth year], 64 y.o.   MRN: [id number] Patient ID: Kurt [name], male   DOB: [birth year], 64 y.o.   MRN: [id number]

## 2019-12-10 LAB — CBC WITH DIFFERENTIAL/PLATELET
Abs Immature Granulocytes: 0.11 10*3/uL — ABNORMAL HIGH (ref 0.00–0.07)
Basophils Absolute: 0 10*3/uL (ref 0.0–0.1)
Basophils Relative: 0 %
Eosinophils Absolute: 0.1 10*3/uL (ref 0.0–0.5)
Eosinophils Relative: 1 %
HCT: 34.4 % — ABNORMAL LOW (ref 39.0–52.0)
Hemoglobin: 11.3 g/dL — ABNORMAL LOW (ref 13.0–17.0)
Immature Granulocytes: 1 %
Lymphocytes Relative: 17 %
Lymphs Abs: 1.8 10*3/uL (ref 0.7–4.0)
MCH: 32.9 pg (ref 26.0–34.0)
MCHC: 32.8 g/dL (ref 30.0–36.0)
MCV: 100.3 fL — ABNORMAL HIGH (ref 80.0–100.0)
Monocytes Absolute: 1 10*3/uL (ref 0.1–1.0)
Monocytes Relative: 10 %
Neutro Abs: 7.4 10*3/uL (ref 1.7–7.7)
Neutrophils Relative %: 71 %
Platelets: 273 10*3/uL (ref 150–400)
RBC: 3.43 MIL/uL — ABNORMAL LOW (ref 4.22–5.81)
RDW: 13 % (ref 11.5–15.5)
WBC: 10.5 10*3/uL (ref 4.0–10.5)
nRBC: 0 % (ref 0.0–0.2)

## 2019-12-10 LAB — GLUCOSE, CAPILLARY: Glucose-Capillary: 89 mg/dL (ref 70–99)

## 2019-12-10 NOTE — Progress Notes (Signed)
PROGRESS NOTE    Kurt [name]  RUE:[id number] DOB: Mar 08, 1956 DOA: 11/23/2019 PCP: Venia Carbon, MD    Brief Narrative:  He was initially admitted to ICU on 12/24 after V-fib cardiac arrest due to STEMI. Status post primary PCI with DES to RCA, now on aspirin and Brilinta, being followed by cardiology. Of note, patient has history of cocaine abuse. Patient was extubated , had some agitation requiring Precedex. This was thought due to ICU delirium and possible withdrawal from cocaine or other substances. He was able to be weaned from [place] and transferred to hospitalist team now.   Consultants:   Cards, pccm  Procedures: CT, ECHo  Antimicrobials:   none   Subjective: No new complaints. No cp or sob  Objective: Vitals:   12/10/19 0242 12/10/19 0411 12/10/19 0721 12/10/19 0936  BP:  106/65 119/82   Pulse:  (!) 56 (!) 57 87  Resp:  20 18   Temp:  98.3 F (36.8 C) (!) 97.5 F (36.4 C)   TempSrc:   Oral   SpO2:  96% 95%   Weight: 72.8 kg     Height:        Intake/Output Summary (Last 24 hours) at 12/10/2019 1522 Last data filed at 12/10/2019 1241 Gross per 24 hour  Intake 240 ml  Output 250 ml  Net -10 ml   Filed Weights   12/08/19 0433 12/09/19 0541 12/10/19 0242  Weight: 70.9 kg 72.1 kg 72.8 kg    Examination:  General exam: Appears calm and comfortable ,  Sitting in chair, nad Respiratory system: Clear to auscultation.  No wheeze rales rhonchi's Cardiovascular system: RRR, S1 & S2 heard, No murmurs, rubs, gallops or clicks. Gastrointestinal system: Abdomen is nondistended, soft and nontender. +bs Central nervous system: Alert and oriented. No focal neurological deficits.response is appropriate Extremities: No edema or cyanosis Psychiatry: Judgement and insight appear normal. Mood & affect appropriate.     Data Reviewed: I have personally reviewed following labs and imaging studies  CBC: Recent Labs  Lab 12/06/19 0522 12/07/19 0511  12/08/19 0632 12/09/19 0549 12/10/19 0659  WBC 15.9* 15.1* 14.4* 11.3* 10.5  NEUTROABS 12.1* 11.0* 10.6* 7.4 7.4  HGB 11.3* 11.4* 11.2* 10.8* 11.3*  HCT 34.1* 33.7* 33.8* 33.2* 34.4*  MCV 100.0 98.8 99.7 100.0 100.3*  PLT 249 282 271 265 147   Basic Metabolic Panel: Recent Labs  Lab 12/04/19 0429 12/05/19 1024 12/06/19 0522 12/07/19 0511  NA 146* 143 144 143  K 3.0* 3.1* 3.5 3.5  CL 112* 112* 115* 112*  CO2 '26 22 23 23  ' GLUCOSE 143* 107* 92 95  BUN 27* '21 23 20  ' CREATININE 0.79 0.74 0.83 0.88  CALCIUM 8.4* 8.1* 8.1* 8.4*  MG 2.0 1.8  --   --    GFR: Estimated Creatinine Clearance: 88.2 mL/min (by C-G formula based on SCr of 0.88 mg/dL). Liver Function Tests: No results for input(s): AST, ALT, ALKPHOS, BILITOT, PROT, ALBUMIN in the last 168 hours. No results for input(s): LIPASE, AMYLASE in the last 168 hours. No results for input(s): AMMONIA in the last 168 hours. Coagulation Profile: No results for input(s): INR, PROTIME in the last 168 hours. Cardiac Enzymes: Recent Labs  Lab 12/03/19 1922  CKMB 2.6   BNP (last 3 results) No results for input(s): PROBNP in the last 8760 hours. HbA1C: No results for input(s): HGBA1C in the last 72 hours. CBG: Recent Labs  Lab 12/09/19 0748 12/09/19 1106 12/09/19 1615 12/09/19 2002 12/10/19 0005  GLUCAP 90 99 102* 124* 89   Lipid Profile: No results for input(s): CHOL, HDL, LDLCALC, TRIG, CHOLHDL, LDLDIRECT in the last 72 hours. Thyroid Function Tests: No results for input(s): TSH, T4TOTAL, FREET4, T3FREE, THYROIDAB in the last 72 hours. Anemia Panel: No results for input(s): VITAMINB12, FOLATE, FERRITIN, TIBC, IRON, RETICCTPCT in the last 72 hours. Sepsis Labs: No results for input(s): PROCALCITON, LATICACIDVEN in the last 168 hours.  Recent Results (from the past 240 hour(s))  Culture, respiratory (non-expectorated)     Status: None   Collection Time: 12/01/19  1:24 PM   Specimen: Tracheal Aspirate; Respiratory   Result Value Ref Range Status   Specimen Description   Final    TRACHEAL ASPIRATE Performed at [facility], [address] [place], [place] [address]    Special Requests   Final    NONE Performed at [facility], [place] [place], [place] [address]    Gram Stain   Final    RARE WBC PRESENT, PREDOMINANTLY PMN MODERATE SQUAMOUS EPITHELIAL CELLS PRESENT ABUNDANT YEAST RARE GRAM POSITIVE COCCI    Culture   Final    FEW CANDIDA TROPICALIS Standardized susceptibility testing for this organism is not available. Performed at [facility], [place] [address] [place], [place] [address]    Report Status 12/04/2019 FINAL  Final         Radiology Studies: No results found.      Scheduled Meds: . aspirin  81 mg Oral Daily  . chlorhexidine gluconate (MEDLINE KIT)  15 mL Mouth Rinse BID  . Chlorhexidine Gluconate Cloth  6 each Topical Daily  . enoxaparin (LOVENOX) injection  40 mg Subcutaneous Q24H  . famotidine  20 mg Oral BID  . feeding supplement (ENSURE ENLIVE)  237 mL Oral BID BM  . folic acid  1 mg Oral Daily  . lisinopril  5 mg Oral Daily  . magnesium oxide  400 mg Oral Daily  . metoprolol tartrate  50 mg Oral BID  . multivitamin with minerals  1 tablet Oral Daily  . nicotine  21 mg Transdermal Daily  . predniSONE  20 mg Oral Q breakfast  . rosuvastatin  40 mg Oral q1800  . thiamine  100 mg Oral Daily  . ticagrelor  90 mg Oral BID   Continuous Infusions: . sodium chloride    . sodium chloride      Assessment & Plan:   Active Problems:   Cardiac arrest [facility])   STEMI involving right coronary artery (HCC)   Acute respiratory failure (HCC)   Cocaine use   Severe ACUTE Hypoxic and Hypercapnic Respiratory Failure:  -Resolved now  -S/p extubation As needed oxygen as needed Status post Lasix -Continue DuoNeb  -Weaning down to room air now -Was on Solu-Medrol changed to prednisone taper- prednisone was decreased from 64m to  245mdaily x3 last dose tomorrow , then stop   Inferior STEMI:s/p. Primary PCI with DES RCA with preserved left ventricular function on dual antiplatelet therapy Recent hx.o Cocaine  Follow up Cardiology recs: Continue dual antiplatelet therapy uninterrupted for 1 year, continue statin, Lopressor 50 mg twice daily and lisinopril 5 mg daily -Cardiacrehabmay be helpful -Follow-up with Dr. CaClayborn Bignessn 1 to 2 weeks Ok to d/c from cardiac stand point.  Cardiac Arrest-s/p hypothermia protocal Cards following. See above   Electrolyte imbalance: Found to behypokalemic and hypomagnesemic -Statuspost replacement, resolved   Drug abuse: Provided counseling -On benzodiazepine as needed with CIWA protocol  delirium from  cardiogenic shock: Per record was near death -improved -PT OT evaluation-recommends SNF     DVT prophylaxis: lovenox Code Status: Full Family Communication: None at bedside Disposition Plan: SNF pending, d/c when bed available, case management are working on this     LOS: 17 days   Time spent: 45 minutes with more than 50% COC     Nolberto Hanlon, MD [facility] Pager [phone]  If 7PM-7AM, please contact night-coverage [email or web address] Password [facility] 12/10/2019, 3:22 PM Patient ID: Kurt [name], male   DOB: [birth year], 64 y.o.   MRN: [id number]

## 2019-12-10 NOTE — Progress Notes (Signed)
Physical Therapy Treatment Patient Details Name: Kurt [name] MRN: [id number] DOB: 1956-04-09 Today's Date: 12/10/2019    History of Present Illness Pt is a 64 y.o. male found down on scene with lit cigarette.  Pt noted with cardiac arrest; intubated and s/p cath 12/24.  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    PT Comments    Pt motivated to participate hoping to go home tomorrow.  Out of bed without assist.  Stood and was able to walk unit with RW and min guard/supervision.  Overall does well with walker but does have an excessive forward lean causing increased resistance on floor.  He will at times almost march while walking but needs not interventions for balance.  Gait slow but generally steady.    Given improved mobility will adjust discharge recommendations to HHPT with 24 hour assist for safety.  He will need a walker as his son has not been able to locate one at home.   Follow Up Recommendations  Home health PT;Supervision/Assistance - 24 hour     Equipment Recommendations  Rolling walker with 5" wheels;3in1 (PT)    Recommendations for Other Services       Precautions / Restrictions Precautions Precautions: Fall Restrictions Weight Bearing Restrictions: No Other Position/Activity Restrictions: monitor HR and O2    Mobility  Bed Mobility Overal bed mobility: Modified Independent Bed Mobility: Supine to Sit;Sit to Supine Rolling: Modified independent (Device/Increase time)   Supine to sit: Modified independent (Device/Increase time) Sit to supine: Modified independent (Device/Increase time)      Transfers Overall transfer level: Needs assistance Equipment used: Rolling walker (2 wheeled) Transfers: Sit to/from Stand Sit to  Stand: Min guard;Supervision            Ambulation/Gait   Gait Distance (Feet): 200 Feet Assistive device: Rolling walker (2 wheeled) Gait Pattern/deviations: Step-through pattern;Decreased step length - right;Decreased step length - left;Trunk flexed Gait velocity: decreased   General Gait Details: Able to progress gait to full lap around nursing unit with RW and min gaurd.  Heavy lean on RW and irregular step height at times (more like a march which he does at times to stretch his legs).   Stairs             Wheelchair Mobility    Modified Rankin (Stroke Patients Only)       Balance Overall balance assessment: Needs assistance Sitting-balance support: No upper extremity supported;Feet supported Sitting balance-Leahy Scale: Good     Standing balance support: Bilateral upper extremity supported;During functional activity Standing balance-Leahy Scale: Fair Standing balance comment: reliant on RW to steady during ambulation                            Cognition Arousal/Alertness: Awake/alert Behavior During Therapy: WFL for tasks assessed/performed Overall Cognitive Status: Within Functional Limits for tasks assessed                                        Exercises      General Comments        Pertinent Vitals/Pain Pain Assessment: No/denies pain    Home Living  Prior Function            PT Goals (current goals can now be found in the care plan section) Progress towards PT goals: Progressing toward goals    Frequency    Min 2X/week      PT Plan Discharge plan needs to be updated    Co-evaluation              AM-PAC PT "6 Clicks" Mobility   Outcome Measure  Help needed turning from your back to your side while in a flat bed without using bedrails?: None Help needed moving from lying on your back to sitting on the side of a flat bed without using bedrails?: None Help needed  moving to and from a bed to a chair (including a wheelchair)?: A Little Help needed standing up from a chair using your arms (e.g., wheelchair or bedside chair)?: A Little Help needed to walk in hospital room?: A Little Help needed climbing 3-5 steps with a railing? : A Little 6 Click Score: 20    End of Session Equipment Utilized During Treatment: Gait belt Activity Tolerance: Patient tolerated treatment well Patient left: in bed;with call bell/phone within reach;with bed alarm set Nurse Communication: Mobility status       Time: 1430-1444 PT Time Calculation (min) (ACUTE ONLY): 14 min  Charges:  $Gait Training: 8-22 mins                    Chesley Noon, PTA 12/10/19, 3:25 PM

## 2019-12-11 LAB — CBC WITH DIFFERENTIAL/PLATELET
Abs Immature Granulocytes: 0.09 10*3/uL — ABNORMAL HIGH (ref 0.00–0.07)
Basophils Absolute: 0 10*3/uL (ref 0.0–0.1)
Basophils Relative: 0 %
Eosinophils Absolute: 0.1 10*3/uL (ref 0.0–0.5)
Eosinophils Relative: 1 %
HCT: 36.9 % — ABNORMAL LOW (ref 39.0–52.0)
Hemoglobin: 12.1 g/dL — ABNORMAL LOW (ref 13.0–17.0)
Immature Granulocytes: 1 %
Lymphocytes Relative: 18 %
Lymphs Abs: 1.7 10*3/uL (ref 0.7–4.0)
MCH: 32.7 pg (ref 26.0–34.0)
MCHC: 32.8 g/dL (ref 30.0–36.0)
MCV: 99.7 fL (ref 80.0–100.0)
Monocytes Absolute: 0.9 10*3/uL (ref 0.1–1.0)
Monocytes Relative: 9 %
Neutro Abs: 6.9 10*3/uL (ref 1.7–7.7)
Neutrophils Relative %: 71 %
Platelets: 311 10*3/uL (ref 150–400)
RBC: 3.7 MIL/uL — ABNORMAL LOW (ref 4.22–5.81)
RDW: 12.8 % (ref 11.5–15.5)
WBC: 9.8 10*3/uL (ref 4.0–10.5)
nRBC: 0 % (ref 0.0–0.2)

## 2019-12-11 MED ORDER — MAGNESIUM OXIDE 400 (241.3 MG) MG PO TABS: 400.0000 mg | ORAL_TABLET | Freq: Every day | ORAL | 0 refills | Status: DC

## 2019-12-11 MED ORDER — LISINOPRIL 5 MG PO TABS: 5.0000 mg | ORAL_TABLET | Freq: Every day | ORAL | 0 refills | Status: DC

## 2019-12-11 MED ORDER — NICOTINE 21 MG/24HR TD PT24: 21.0000 mg | MEDICATED_PATCH | Freq: Every day | TRANSDERMAL | 0 refills | Status: DC

## 2019-12-11 MED ORDER — TICAGRELOR 90 MG PO TABS: 90.0000 mg | ORAL_TABLET | Freq: Two times a day (BID) | ORAL | 0 refills | Status: DC

## 2019-12-11 MED ORDER — ADULT MULTIVITAMIN W/MINERALS CH: 1.0000 | ORAL_TABLET | Freq: Every day | ORAL | 0 refills | Status: AC

## 2019-12-11 MED ORDER — ROSUVASTATIN CALCIUM 40 MG PO TABS: 40.0000 mg | ORAL_TABLET | Freq: Every day | ORAL | 0 refills | Status: DC

## 2019-12-11 MED ORDER — FOLIC ACID 1 MG PO TABS: 1.0000 mg | ORAL_TABLET | Freq: Every day | ORAL | 0 refills | Status: DC

## 2019-12-11 MED ORDER — METOPROLOL TARTRATE 50 MG PO TABS: 50.0000 mg | ORAL_TABLET | Freq: Two times a day (BID) | ORAL | 0 refills | Status: DC

## 2019-12-11 MED ORDER — THIAMINE HCL 100 MG PO TABS: 100.0000 mg | ORAL_TABLET | Freq: Every day | ORAL | 0 refills | Status: DC

## 2019-12-11 NOTE — TOC Progression Note (Signed)
Transition of Care [facility]) - Progression Note    Patient Details  Name: Kurt [name] MRN: [id number] Date of Birth: [birth year]  [facility]) CM/SW [place], RN Phone Number: 12/11/2019, 10:39 AM  Clinical Narrative:     Discharge plan is to return home with [facility].  Called [employer] to get assistance in searching for [facility] services.  They asked me to call [facility] in [place], plan primary contact for [employer].  Spoke with Verdis Frederickson on phone and she requested I called [facility] to locate a provider.  If a provider is unalbe to be located through this number she has said to call her back.  Patient is discharging home today.   Expected Discharge Plan: [place] Barriers to Discharge: Continued Medical Work up  Expected Discharge Plan and Services Expected Discharge Plan: [place]   Discharge Planning Services: CM Consult   Living arrangements for the past 2 months: [place]                   DME Agency: [facility], [place]                   Social Determinants of Health (SDOH) Interventions    Readmission Risk Interventions No flowsheet data found.

## 2019-12-11 NOTE — Progress Notes (Signed)
4 staples removed from forehead per order/ edges approximated/ steri strip placed- tolerated well

## 2019-12-11 NOTE — TOC Transition Note (Addendum)
Transition of Care [facility]) - CM/SW Discharge Note   Patient Details  Name: Kurt [name] MRN: [id number] Date of Birth: Jun 19, 1956  Transition of Care [facility]) CM/SW Contact:  Victorino Dike, RN Phone Number: 12/11/2019, 2:00 PM   Clinical Narrative:     Patient discharged with Son today.  DME provided by [facility].  BSC and RW.  Searching for homehealth PT services.  [facility] is checking patient's insurance benefit at this time.   Patient given Brillinta coupon to assist with cost of medications.    Final next level of care: [place] Barriers to Discharge: Barriers Resolved   Patient Goals and CMS Choice     Choice offered to / list presented to : Patient  Discharge Placement                       Discharge Plan and Services   Discharge Planning Services: CM Consult            DME Arranged: 3-N-1, Walker rolling DME Agency: [employer] Date DME Agency Contacted: 12/11/19 Time DME Agency Contacted: 67 Representative spoke with at DME Agency: [place]: PT [place]: [place] ([place]) Date [place]: 12/11/19 Time [place]: [id number] Representative spoke with at [place]: [place] ([place]) Interventions     Readmission Risk Interventions No flowsheet data found.

## 2019-12-11 NOTE — Progress Notes (Signed)
Physical Therapy Treatment Patient Details Name: Kurt [name] MRN: [id number] DOB: 1956/11/14 Today's Date: 12/11/2019    History of Present Illness Pt is a 64 y.o. male found down on scene with lit cigarette.  Pt noted with cardiac arrest; intubated and s/p cath 12/24.  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    PT Comments    Pt sitting in recliner upon PT arrival and pt reports feeling good and hoping to discharge home today.  Pt able to ambulate around nursing loop with RW with SBA; improved gait mechanics noted today compared to yesterday and pt appearing steady and safe ambulating with RW.  Steady safe transfers also noted.  Pt declined to trial stairs today.  PT follow-up recommendations updated (care management notified).    Follow Up Recommendations  Home health PT;Supervision - Intermittent     Equipment Recommendations  Rolling walker with 5" wheels;3in1 (PT)    Recommendations for Other Services OT consult     Precautions / Restrictions Precautions Precautions: Fall Restrictions Weight Bearing Restrictions: No Other Position/Activity Restrictions: monitor HR and O2    Mobility  Bed Mobility               General bed mobility comments: Deferred (pt modified independent yesterday)  Transfers Overall transfer level: Needs assistance   Transfers: Sit to/from Stand;Stand Pivot Transfers Sit to Stand: Supervision Stand pivot transfers: Supervision       General transfer comment: strong stand from recliner and controlled descent sitting onto recliner noted; no vc's required  Ambulation/Gait Ambulation/Gait assistance: Supervision Gait Distance (Feet): 260 Feet Assistive device: Rolling walker (2 wheeled) Gait  Pattern/deviations: Step-through pattern     General Gait Details: trialed ambulation 10 feet no AD (pt fairly steady but partial step through gait pattern and decreased cadence noted); switched to RW and good step through gait pattern noted and appropriate cadence noted   Stairs Stairs: (Pt declined to trial stairs (pt educated to hold onto railing and having someone with for stairs for safety))           Wheelchair Mobility    Modified Rankin (Stroke Patients Only)       Balance Overall balance assessment: Needs assistance Sitting-balance support: No upper extremity supported;Feet supported Sitting balance-Leahy Scale: Normal Sitting balance - Comments: steady sitting reaching outside BOS   Standing balance support: No upper extremity supported Standing balance-Leahy Scale: Good Standing balance comment: steady standing reaching within BOS                            Cognition Arousal/Alertness: Awake/alert Behavior During Therapy: WFL for tasks assessed/performed Overall Cognitive Status: Within Functional Limits for tasks assessed                                        Exercises      General Comments   Nursing cleared pt for participation in physical therapy.  Pt agreeable to PT session.      Pertinent Vitals/Pain Pain Assessment: No/denies pain  Vitals (HR and O2 on room air) stable and WFL throughout treatment session.    Home Living  Prior Function            PT Goals (current goals can now be found in the care plan section) Acute Rehab PT Goals Patient Stated Goal: to be able to walk again PT Goal Formulation: With patient Time For Goal Achievement: 12/18/19 Potential to Achieve Goals: Good Progress towards PT goals: Progressing toward goals    Frequency    Min 2X/week      PT Plan Discharge plan needs to be updated(Care management notified)    Co-evaluation               AM-PAC PT "6 Clicks" Mobility   Outcome Measure  Help needed turning from your back to your side while in a flat bed without using bedrails?: None Help needed moving from lying on your back to sitting on the side of a flat bed without using bedrails?: None Help needed moving to and from a bed to a chair (including a wheelchair)?: A Little Help needed standing up from a chair using your arms (e.g., wheelchair or bedside chair)?: A Little Help needed to walk in hospital room?: A Little Help needed climbing 3-5 steps with a railing? : A Little 6 Click Score: 20    End of Session Equipment Utilized During Treatment: Gait belt Activity Tolerance: Patient tolerated treatment well Patient left: in chair;with call bell/phone within reach(No chair alarm on upon PT arrival; pt's nurse reports pt refusing chair alarm and ok with no chair alarm on) Nurse Communication: Mobility status;Precautions       Time: [id number] PT Time Calculation (min) (ACUTE ONLY): 11 min  Charges:  $Therapeutic Activity: 8-22 mins                     Leitha Bleak, PT 12/11/19, 10:22 AM

## 2019-12-11 NOTE — Progress Notes (Signed)
Discharge instructions explained to pt and pts son/ verbalized an understanding/ IJ and tele removed/ transported off unit via wheelchair.

## 2019-12-11 NOTE — Progress Notes (Signed)
Spoke with Lucina Mellow and she is aware of the discontinue CVC order for pt to go home.  Gerlene Burdock

## 2019-12-11 NOTE — Progress Notes (Signed)
Nutrition Follow Up Note   DOCUMENTATION CODES:   Not applicable  INTERVENTION:   D/c Ensure Enlive   MVI, folic acid and thiamine daily   NUTRITION DIAGNOSIS:   Inadequate oral intake related to inability to eat as evidenced by NPO status.  GOAL:   Patient will meet greater than or equal to 90% of their needs  -progressing   MONITOR:   PO intake, Supplement acceptance, Labs, Weight trends, I & O's  ASSESSMENT:   64 year old male with PMHx of COPD, ED, HTN admitted with acute cardiac arrest and acute STEMI with RCA occlusion associated with cocaine toxicity s/p PCI to proximal RCA, s/p 36C TTM protocol.   Pt with increased appetite and oral intake; pt eating 100% of meals in hospital. Pt is refusing Ensure, will discontinue. Per chart, pt is down ~24lbs since admit. Pt to discharge today.   Medications reviewed and include: aspirin, lovenox, pepcid, folic acid, Mg oxide, MVI, nicotine, thiamine   Labs reviewed:   Diet Order:   Diet Order            Diet - low sodium heart healthy        Diet Heart Room service appropriate? Yes; Fluid consistency: Thin  Diet effective now             EDUCATION NEEDS:   No education needs have been identified at this time  Skin:  Skin Assessment: Reviewed RN Assessment  Last BM:  1/10  Height:   Ht Readings from Last 1 Encounters:  11/23/19 5\' 9"  (1.753 m)   Weight:   Wt Readings from Last 1 Encounters:  12/11/19 71.8 kg   Ideal Body Weight:  72.7 kg  BMI:  Body mass index is 23.36 kg/m.  Estimated Nutritional Needs:   Kcal:  1950-2150  Protein:  100-124 grams (1.2-1.5 grams/kg)  Fluid:  2-2.3 L/day  Koleen Distance MS, RD, LDN Pager #- [phone] Office#- [phone] After Hours Pager: [phone]

## 2019-12-11 NOTE — Discharge Summary (Signed)
Kurt [name] CBJ:[id number] DOB: Oct 22, 1956 DOA: 11/23/2019  PCP: Venia Carbon, MD  Admit date: 11/23/2019 Discharge date: 12/11/2019  Admitted From: Home Disposition: Home  Recommendations for Outpatient Follow-up:  1. Follow up with PCP in 1 week 2. Please obtain BMP/CBC in one week 3. Please follow up on the following pending results: None 4. Cardiology Dr. Marcelline Deist in 1 week  Home Health: PT OT   Discharge Condition:Stable CODE STATUS:full  Diet recommendation: Heart Healthy  Brief/Interim Summary: Patient reportedly had been using cocaine went outside to smoke a cigarette and reportedly arrested and was found on the floor he suffered injury to his face EMS was called his initial rhythm was V. fib he was shocked producing a wide-complex rhythm x 6. TOTAL DOWN TIME SEEMS TO BE APPROX 20 MINS Code STEMI was initiated patient was brought to the emergency room after quick evaluation by the ER the elected to intubate and sedate him to protect his airway he also went to CT to evaluate for any head injury and to rule out CVA patient was then released to have cardiac cath for evaluation of possible STEMI.Initial high-sensitivity troponin was 230 but this was post defibrillation.Patient reportedly received amiodarone in the field 300 then 150.He was initially admitted to ICU on 12/24 after V-fib cardiac arrest due to STEMI. Status post primary PCI with DES to RCA, now on aspirin and Brilinta, being followed by cardiology. Of note, patient has history of cocaine abuse. Patient was extubated , had some agitation requiring Precedex. This was thought due to ICU delirium and possible withdrawal from cocaine or other substances. He was able to be weaned from [place] transferred to hospitalist team .  He did have delirium from cardiogenic shock with slowly recovering but back at his baseline.  While on the medical floor he was on telemetry.  He received PT and OT.  They had  recommended SNF however patient's insurance did not cover for that.  It was decided patient is going home with home PT OT.  He was counseled extensively about cigarette smoking cocaine use or alcohol use. He is stable to be discharged home.  Discharge Diagnoses:  Active Problems:   Cardiac arrest [facility])   STEMI involving right coronary artery ([name])   Acute respiratory failure ([facility])   Cocaine use    Discharge Instructions  Discharge Instructions    AMB Referral to Cardiac Rehabilitation - Phase II   Complete by: As directed    Diagnosis: STEMI   After initial evaluation and assessments completed: Virtual Based Care may be provided alone or in conjunction with Phase 2 Cardiac Rehab based on patient barriers.: Yes   Activity as tolerated - No restrictions   Complete by: As directed    Call MD for:  difficulty breathing, headache or visual disturbances   Complete by: As directed    Diet - low sodium heart healthy   Complete by: As directed    Discharge instructions   Complete by: As directed    Follow up with pcp in one week F/u with Dr. Clayborn Bigness cardiology in one week, need to get refills on cardiac meds then   Increase activity slowly   Complete by: As directed      Allergies as of 12/11/2019   No Known Allergies     Medication List    STOP taking these medications   hydrochlorothiazide 12.5 MG tablet Commonly known as: HYDRODIURIL   losartan 50 MG tablet Commonly known as: COZAAR  TAKE these medications   aspirin 81 MG tablet Take 81 mg by mouth daily.   folic acid 1 MG tablet Commonly known as: FOLVITE Take 1 tablet (1 mg total) by mouth daily. Start taking on: December 12, 2019   lisinopril 5 MG tablet Commonly known as: ZESTRIL Take 1 tablet (5 mg total) by mouth daily. Start taking on: December 12, 2019   magnesium oxide 400 (241.3 Mg) MG tablet Commonly known as: MAG-OX Take 1 tablet (400 mg total) by mouth daily. Start taking on: December 12, 2019    metoprolol tartrate 50 MG tablet Commonly known as: LOPRESSOR Take 1 tablet (50 mg total) by mouth 2 (two) times daily.   multivitamin with minerals Tabs tablet Take 1 tablet by mouth daily. Start taking on: December 12, 2019   nicotine 21 mg/24hr patch Commonly known as: NICODERM CQ - dosed in mg/24 hours Place 1 patch (21 mg total) onto the skin daily. Start taking on: December 12, 2019   rosuvastatin 40 MG tablet Commonly known as: CRESTOR Take 1 tablet (40 mg total) by mouth daily at 6 PM.   thiamine 100 MG tablet Take 1 tablet (100 mg total) by mouth daily. Start taking on: December 12, 2019   ticagrelor 90 MG Tabs tablet Commonly known as: BRILINTA Take 1 tablet (90 mg total) by mouth 2 (two) times daily.      Follow-up Information    Viviana Simpler I, MD Follow up in 1 week(s).   Specialties: Internal Medicine, Pediatrics Contact information: [place] [address] [phone]        Yolonda Kida, MD Follow up in 1 week(s).   Specialties: Cardiology, Internal Medicine Contact information: [place] [address] [phone]        [facility] Follow up.   Specialty: Cardiac Rehabilitation Why: Your Cardiologist has referred you to outpatient Cardiac Rehab at [facility].  You would need to complete in-home physical therapy prior to starting outpatient Cardiac Rehab.  The Cardiac Rehab department will contact you by phone to check on your progress.   Contact information: [place] [id number] [address] [place] [phone]         No Known Allergies  Consultations:  Cardiology, critical care   Procedures/Studies: DG Chest 1 View  Result Date: 11/23/2019 CLINICAL DATA:  Intubation. Cardiac arrest. EXAM: CHEST  1 VIEW COMPARISON:  03/16/2018 FINDINGS: Endotracheal tube is in good position 3 cm above the carina. Heart size and pulmonary vascularity are  normal. Slight haziness in the mid and upper lung zones may represent mild pulmonary edema. No effusions. No pneumothorax. IMPRESSION: 1. Endotracheal tube in good position. 2. Hazy infiltrates in the mid and upper lung zones may represent mild pulmonary edema. Aspiration pneumonitis could also give this appearance. Electronically Signed   By: Lorriane Shire M.D.   On: 11/23/2019 17:24   DG Abd 1 View  Result Date: 11/23/2019 CLINICAL DATA:  Central line and OG placement EXAM: ABDOMEN - 1 VIEW COMPARISON:  Same day chest radiograph FINDINGS: Pacer pads overlie the lower chest. Telemetry leads in monitoring devices also project over the upper abdomen and lower chest. The tip of a transesophageal tube terminates near the level of the gastric antrum with the side port beyond the GE junction. Hazy opacities in the lungs are better seen on dedicated chest radiograph performed concurrently. Coarse radiodensities projecting over both renal shadows worrisome for urolithiasis if this patient  has not recently received contrast media. Degenerative changes present the spine. No high-grade obstructive bowel gas pattern. IMPRESSION: 1. Transesophageal tube tip is near the level of the gastric antrum with the side port beyond the GE junction. 2. No high-grade obstructive bowel gas pattern. 3. Coarse density over both renal shadows could reflect urolithiasis if this patient has not recently received contrast media. Electronically Signed   By: Lovena Le M.D.   On: 11/23/2019 21:51   CT Head Wo Contrast  Result Date: 11/23/2019 CLINICAL DATA:  Head trauma. Cardiac arrest. EXAM: CT HEAD WITHOUT CONTRAST CT CERVICAL SPINE WITHOUT CONTRAST TECHNIQUE: Multidetector CT imaging of the head and cervical spine was performed following the standard protocol without intravenous contrast. Multiplanar CT image reconstructions of the cervical spine were also generated. COMPARISON:  None. FINDINGS: CT HEAD FINDINGS Brain: No evidence  of acute infarction, hemorrhage, hydrocephalus, extra-axial collection or mass lesion/mass effect. Vascular: No hyperdense vessel or unexpected calcification. Skull: Normal. Negative for fracture or focal lesion. Sinuses/Orbits: No acute finding. Evidence of previous maxillary sinus surgery. Slight mucosal thickening in the ethmoid air cells. Other: None CT CERVICAL SPINE FINDINGS Alignment: Normal. Skull base and vertebrae: No acute fracture. No primary bone lesion or focal pathologic process. Soft tissues and spinal canal: No prevertebral fluid or swelling. No visible canal hematoma. Disc levels: There are no disc protrusions or significant disc bulges in the cervical spine. There is no spinal stenosis. There is moderate left foraminal stenosis at C6-7 due to facet arthritis. The neural foramina throughout the remainder of the cervical spine are widely patent. Moderate right facet arthritis at C3-4. Upper chest: Focal area of infiltrate at the right lung apex posteriorly. Patchy haziness in both lung apices. Endotracheal tube in place. Other: None IMPRESSION: 1. Normal CT scan of the head. 2. No significant abnormality of the cervical spine. Moderate left foraminal stenosis at C6-7. Electronically Signed   By: Lorriane Shire M.D.   On: 11/23/2019 17:30   CT Cervical Spine Wo Contrast  Result Date: 11/23/2019 CLINICAL DATA:  Head trauma. Cardiac arrest. EXAM: CT HEAD WITHOUT CONTRAST CT CERVICAL SPINE WITHOUT CONTRAST TECHNIQUE: Multidetector CT imaging of the head and cervical spine was performed following the standard protocol without intravenous contrast. Multiplanar CT image reconstructions of the cervical spine were also generated. COMPARISON:  None. FINDINGS: CT HEAD FINDINGS Brain: No evidence of acute infarction, hemorrhage, hydrocephalus, extra-axial collection or mass lesion/mass effect. Vascular: No hyperdense vessel or unexpected calcification. Skull: Normal. Negative for fracture or focal lesion.  Sinuses/Orbits: No acute finding. Evidence of previous maxillary sinus surgery. Slight mucosal thickening in the ethmoid air cells. Other: None CT CERVICAL SPINE FINDINGS Alignment: Normal. Skull base and vertebrae: No acute fracture. No primary bone lesion or focal pathologic process. Soft tissues and spinal canal: No prevertebral fluid or swelling. No visible canal hematoma. Disc levels: There are no disc protrusions or significant disc bulges in the cervical spine. There is no spinal stenosis. There is moderate left foraminal stenosis at C6-7 due to facet arthritis. The neural foramina throughout the remainder of the cervical spine are widely patent. Moderate right facet arthritis at C3-4. Upper chest: Focal area of infiltrate at the right lung apex posteriorly. Patchy haziness in both lung apices. Endotracheal tube in place. Other: None IMPRESSION: 1. Normal CT scan of the head. 2. No significant abnormality of the cervical spine. Moderate left foraminal stenosis at C6-7. Electronically Signed   By: Lorriane Shire M.D.   On: 11/23/2019 17:30  CARDIAC CATHETERIZATION  Result Date: 11/23/2019  Prox LAD lesion is 50% stenosed.  Prox RCA lesion is 100% stenosed. Infarct-related artery TIMI 0 flow  Dist Cx lesion is 90% stenosed.  Normal left ventricular function ejection fraction between 50 and 55%  Conclusion STEMI presentation 100% proximal RCA TIMI 0 flow Successful PCI and stent with DES 3.0 x 26 mm resolute Onyx to 13 atm to proximal RCA restoring TIMI-3 flow Left main free of disease LAD large with a proximal 50% lesion Circumflex very large with a distal 90% distal circumflex lesion Mixed dominant system Preserved left ventricular function of 50 to 55% Circumflex lesion intervention was deferred   DG Chest Port 1 View  Result Date: 11/30/2019 CLINICAL DATA:  Pulmonary disease. EXAM: PORTABLE CHEST 1 VIEW COMPARISON:  Radiograph yesterday. FINDINGS: Endotracheal tube is 3.3 cm from the carina.  Enteric tube tip below the diaphragm not included in the field of view. Right internal jugular central venous catheter tip projects over the upper SVC. Hazy bilateral lung base opacities likely combination of pleural fluid and atelectasis/airspace disease, unchanged. There is vascular congestion. Heart is normal in size with unchanged mediastinal contours allowing for rotation. No pneumothorax. IMPRESSION: 1. Vascular congestion with unchanged hazy lung base opacities, likely combination of pleural effusions and atelectasis/airspace disease. 2. Stable support apparatus. Electronically Signed   By: Keith Rake M.D.   On: 11/30/2019 03:14   DG Chest Port 1 View  Result Date: 11/29/2019 CLINICAL DATA:  Acute respiratory failure. EXAM: PORTABLE CHEST 1 VIEW COMPARISON:  November 27, 2019. FINDINGS: Stable cardiomediastinal silhouette. Endotracheal and nasogastric tubes are unchanged in position. No pneumothorax is noted. Increased bibasilar opacities are noted concerning for worsening atelectasis or edema with small right pleural effusion. Bony thorax is unremarkable. IMPRESSION: Stable support apparatus. Increased bibasilar opacities are noted concerning for worsening atelectasis or edema with small right pleural effusion. No pneumothorax is noted. Electronically Signed   By: Marijo Conception M.D.   On: 11/29/2019 07:58   DG Chest Port 1 View  Result Date: 11/27/2019 CLINICAL DATA:  Acute respiratory failure. EXAM: PORTABLE CHEST 1 VIEW COMPARISON:  Chest x-ray 11/23/2019 FINDINGS: The endotracheal tube is 2.7 cm above the carina. The NG tube is coursing down the esophagus and into the stomach. The right IJ central venous catheter is stable with its tip in the mid SVC. The cardiac silhouette, mediastinal and hilar contours are within normal limits and stable. The external pacer paddles have been removed. Slight improved lung aeration. There is persistent interstitial prominence which could reflect mild  edema but no infiltrates or effusions. IMPRESSION: 1. Support apparatus in good position, unchanged. 2. Interstitial prominence could suggest mild edema but overall improved aeration. Electronically Signed   By: Marijo Sanes M.D.   On: 11/27/2019 07:16   DG Chest Port 1 View  Result Date: 11/23/2019 CLINICAL DATA:  Central line placement and OG placement EXAM: PORTABLE CHEST 1 VIEW COMPARISON:  Radiograph 11/23/2019 FINDINGS: Endotracheal tube terminates in the mid trachea, 3 cm from the carina. A transesophageal tube tip and side port distal to the GE junction, terminating about the level of the gastric antrum. A right IJ catheter tip is present in the mid to upper SVC. Overlying pacer pads are noted. Telemetry wires project over lower chest as well. Hazy opacities in the mid to upper lungs are similar to prior. No pneumothorax. No effusion. The cardiomediastinal contours are unremarkable. Coarse calcifications noted upper pole left kidney. IMPRESSION: 1. Endotracheal tube 3  cm from the carina. 2. A transesophageal tube tip and side port are distal to the GE junction. 3. Right IJ catheter tip in the mid to upper SVC. 4. Stable hazy opacities in the mid to upper lungs bilaterally. 5. Left upper pole renal calcification. Electronically Signed   By: Lovena Le M.D.   On: 11/23/2019 21:48   EEG adult  Result Date: 11/27/2019 Alexis Goodell, MD     11/27/2019  3:10 PM ELECTROENCEPHALOGRAM REPORT Patient: Colen Eltzroth       Room #: IC07A-AA EEG No. ID: [id number] Age: 65 y.o.        Sex: male Referring Physician: Kasa Report Date:  11/27/2019       Interpreting Physician: Alexis Goodell History: Sheldon Sem is an 64 y.o. male s/p arrest Medications: Propofol, Fentanyl, Zosyn, Brilinta, Crestor, Insulin, ASA Conditions of Recording:  This is a 21 channel routine scalp EEG performed with bipolar and monopolar montages arranged in accordance to the international 10/20 system of electrode placement. One  channel was dedicated to EKG recording. The patient is in the intubated and sedated state. Description:  The waking background activity is discontinuous.  It consists of intermittent, generalized periods of attenuation lasting up to 15 seconds alternating with periods of higher voltage, disorganized activity.  This disorganized burst activity has an underlying slow rhythm consisting of polymorphic delta activity with superimposed faster rhythms. The patient received noxious stimulation with no activation of the background noted.  Hyperventilation and iIntermittent photic stimulation were not performed. IMPRESSION: This is an abnormal EEG due to a burst-suppression pattern seen throughout the tracing.  Burst suppression pattern can be seen in a variety of circumstances, including anesthesia (medicatoin effect), drug intoxication, hypothermia, as well as cerebral anoxia.  Clinical/neurological, and radiographic correlation advised.  Alexis Goodell, MD Neurology [phone] 11/27/2019, 2:58 PM   ECHOCARDIOGRAM COMPLETE  Result Date: 11/24/2019   ECHOCARDIOGRAM REPORT   Patient Name:   Kurt [name] Date of Exam: 11/24/2019 Medical Rec #:  [id number]      Height:       69.0 in Accession #:    [id number]     Weight:       196.4 lb Date of Birth:  07-28-1956      BSA:          2.05 m Patient Age:    63 years       BP:           125/95 mmHg Patient Gender: M              HR:           52 bpm. Exam Location:  [facility] Procedure: 2D Echo and Intracardiac Opacification Agent Indications:     Cardiac Arrest  History:         Patient has no prior history of Echocardiogram examinations.                  CAD, COPD; Risk Factors:Cocaine Abuse.  Sonographer:     LTM Referring Phys:  [id number] [name] Diagnosing Phys: Yolonda Kida MD  Sonographer Comments: Echo performed with patient supine and on artificial respirator, no parasternal window and no apical window. Image acquisition challenging due to COPD. IMPRESSIONS   1. Left ventricular ejection fraction, by visual estimation, is 50 to 55%. The left ventricle has normal function. There is no left ventricular hypertrophy.  2. Definity contrast agent was given IV to delineate the left ventricular endocardial borders.  3.  The left ventricle has no regional wall motion abnormalities.  4. Akinetic right ventricular.  5. Global right ventricle has severely reduced systolic function.The right ventricular size is moderately enlarged. No increase in right ventricular wall thickness.  6. Left atrial size was normal.  7. Right atrial size was normal.  8. The mitral valve is normal in structure. No evidence of mitral valve regurgitation.  9. The tricuspid valve is normal in structure. 10. The aortic valve is normal in structure. Aortic valve regurgitation is not visualized. 11. The pulmonic valve was not well visualized. Pulmonic valve regurgitation is not visualized. 12. Normal pulmonary artery systolic pressure. 13. Left ventricular ejection fraction by PLAX is is 63 % FINDINGS  Left Ventricle: Left ventricular ejection fraction, by visual estimation, is 50 to 55%. Left ventricular ejection fraction by PLAX is is 63 % The left ventricle has normal function. Definity contrast agent was given IV to delineate the left ventricular endocardial borders. The left ventricle has no regional wall motion abnormalities. There is no left ventricular hypertrophy. Right Ventricle: The right ventricular size is moderately enlarged. No increase in right ventricular wall thickness. Global RV systolic function is has severely reduced systolic function. The tricuspid regurgitant velocity is 1.61 m/s, and with an assumed right atrial pressure of 10 mmHg, the estimated right ventricular systolic pressure is normal at 20.4 mmHg. The motion of the right ventricle akinetic. Left Atrium: Left atrial size was normal in size. Right Atrium: Right atrial size was normal in size Pericardium: There is no evidence of  pericardial effusion. Mitral Valve: The mitral valve is normal in structure. No evidence of mitral valve regurgitation. Tricuspid Valve: The tricuspid valve is normal in structure. Tricuspid valve regurgitation is trivial. Aortic Valve: The aortic valve is normal in structure. Aortic valve regurgitation is not visualized. Aortic valve mean gradient measures 2.0 mmHg. Aortic valve peak gradient measures 3.4 mmHg. Aortic valve area, by VTI measures 3.01 cm. Pulmonic Valve: The pulmonic valve was not well visualized. Pulmonic valve regurgitation is not visualized. Pulmonic regurgitation is not visualized. Aorta: The aortic root is normal in size and structure. IAS/Shunts: No atrial level shunt detected by color flow Doppler.  LEFT VENTRICLE PLAX 2D LV EF:         Left ventricular ejection fraction by PLAX is is 63 % LVIDd:         5.23 cm LVIDs:         4.19 cm LV PW:         0.88 cm LV IVS:        1.18 cm LVOT diam:     2.50 cm LV SV:         53 ml LV SV Index:   25.24 LVOT Area:     4.91 cm  LV Volumes (MOD) LV area d, A4C:    21.80 cm LV area s, A4C:    10.20 cm LV major d, A4C:   7.30 cm LV major s, A4C:   5.81 cm LV vol d, MOD A4C: 52.4 ml LV vol s, MOD A4C: 14.8 ml LV SV MOD A4C:     52.4 ml AORTIC VALVE AV Area (Vmax):    2.77 cm AV Area (Vmean):   2.48 cm AV Area (VTI):     3.01 cm AV Vmax:           91.60 cm/s AV Vmean:          69.800 cm/s AV VTI:  0.137 m AV Peak Grad:      3.4 mmHg AV Mean Grad:      2.0 mmHg LVOT Vmax:         51.70 cm/s LVOT Vmean:        35.200 cm/s LVOT VTI:          0.084 m LVOT/AV VTI ratio: 0.61 MV E velocity: 39.80 cm/s 103 cm/s  TRICUSPID VALVE MV A velocity: 49.00 cm/s 70.3 cm/s TR Peak grad:   10.4 mmHg MV E/A ratio:  0.81       1.5       TR Vmax:        161.00 cm/s                                      SHUNTS                                     Systemic VTI:  0.08 m                                     Systemic Diam: 2.50 cm  Dwayne Prince Rome MD Electronically  signed by Yolonda Kida MD Signature Date/Time: 11/24/2019/11:08:46 AM    Final        Subjective: Patient denies any shortness of breath or chest pain or any other symptoms.  He reports he is ready to go home.  Discharge Exam: Vitals:   12/11/19 0350 12/11/19 0901  BP: 111/70 119/77  Pulse: 61 61  Resp: 18 18  Temp: 98.7 F (37.1 C) 98.5 F (36.9 C)  SpO2: 99% 100%   Vitals:   12/10/19 1604 12/10/19 1925 12/11/19 0350 12/11/19 0901  BP: 110/73 121/75 111/70 119/77  Pulse: 62 67 61 61  Resp: '18 20 18 18  ' Temp: 98.2 F (36.8 C) 98 F (36.7 C) 98.7 F (37.1 C) 98.5 F (36.9 C)  TempSrc: Oral  Oral Oral  SpO2: 96% 97% 99% 100%  Weight:   71.8 kg   Height:        General: Pt is alert, awake, not in acute distress Cardiovascular: RRR, S1/S2 +, no rubs, no gallops Respiratory: CTA bilaterally, no wheezing, no rhonchi Abdominal: Soft, NT, ND, bowel sounds + Extremities: no edema, no cyanosis    The results of significant diagnostics from this hospitalization (including imaging, microbiology, ancillary and laboratory) are listed below for reference.     Microbiology: No results found for this or any previous visit (from the past 240 hour(s)).   Labs: BNP (last 3 results) Recent Labs    11/23/19 2314  BNP 22.9   Basic Metabolic Panel: Recent Labs  Lab 12/05/19 1024 12/06/19 0522 12/07/19 0511  NA 143 144 143  K 3.1* 3.5 3.5  CL 112* 115* 112*  CO2 '22 23 23  ' GLUCOSE 107* 92 95  BUN '21 23 20  ' CREATININE 0.74 0.83 0.88  CALCIUM 8.1* 8.1* 8.4*  MG 1.8  --   --    Liver Function Tests: No results for input(s): AST, ALT, ALKPHOS, BILITOT, PROT, ALBUMIN in the last 168 hours. No results for input(s): LIPASE, AMYLASE in the last 168 hours. No results for input(s): AMMONIA in the last 168 hours. CBC: Recent Labs  Lab 12/07/19 0511 12/08/19 5834 12/09/19 0549 12/10/19 0659 12/11/19 0608  WBC 15.1* 14.4* 11.3* 10.5 9.8  NEUTROABS 11.0* 10.6* 7.4  7.4 6.9  HGB 11.4* 11.2* 10.8* 11.3* 12.1*  HCT 33.7* 33.8* 33.2* 34.4* 36.9*  MCV 98.8 99.7 100.0 100.3* 99.7  PLT 282 271 265 273 311   Cardiac Enzymes: No results for input(s): CKTOTAL, CKMB, CKMBINDEX, TROPONINI in the last 168 hours. BNP: Invalid input(s): POCBNP CBG: Recent Labs  Lab 12/09/19 0748 12/09/19 1106 12/09/19 1615 12/09/19 2002 12/10/19 0005  GLUCAP 90 99 102* 124* 89   D-Dimer No results for input(s): DDIMER in the last 72 hours. Hgb A1c No results for input(s): HGBA1C in the last 72 hours. Lipid Profile No results for input(s): CHOL, HDL, LDLCALC, TRIG, CHOLHDL, LDLDIRECT in the last 72 hours. Thyroid function studies No results for input(s): TSH, T4TOTAL, T3FREE, THYROIDAB in the last 72 hours.  Invalid input(s): FREET3 Anemia work up No results for input(s): VITAMINB12, FOLATE, FERRITIN, TIBC, IRON, RETICCTPCT in the last 72 hours. Urinalysis    Component Value Date/Time   COLORURINE YELLOW (A) 11/23/2019 1627   APPEARANCEUR CLOUDY (A) 11/23/2019 1627   LABSPEC 1.010 11/23/2019 1627   PHURINE 7.0 11/23/2019 1627   GLUCOSEU >=500 (A) 11/23/2019 1627   HGBUR MODERATE (A) 11/23/2019 1627   BILIRUBINUR NEGATIVE 11/23/2019 1627   KETONESUR NEGATIVE 11/23/2019 1627   PROTEINUR 100 (A) 11/23/2019 1627   NITRITE NEGATIVE 11/23/2019 1627   LEUKOCYTESUR NEGATIVE 11/23/2019 1627   Sepsis Labs Invalid input(s): PROCALCITONIN,  WBC,  LACTICIDVEN Microbiology No results found for this or any previous visit (from the past 240 hour(s)).   Time coordinating discharge: Over 30 minutes  SIGNED:   Nolberto Hanlon, MD  [facility] 12/11/2019, 6:10 PM Pager   If 7PM-7AM, please contact night-coverage [email or web address] Password [email or web address]

## 2019-12-13 ENCOUNTER — Telehealth: Payer: Self-pay | Admitting: Internal Medicine

## 2019-12-13 ENCOUNTER — Encounter: Payer: Self-pay | Admitting: Internal Medicine

## 2019-12-13 NOTE — Telephone Encounter (Signed)
Patient and son called back Advised of message below and they stated they understood.   The son is requesting a call back from you to discuss paperwork he would need done for the [employer] so he could get re assigned since he is home taking care of his dad.

## 2019-12-13 NOTE — Telephone Encounter (Signed)
Patient stated he was recently in the hospital from having a heart attack He said he had been speaking with you while he was there. He wanted to see if you still where able to do house calls and could come out and see him. Patient said due to the heart attack he is not able to get out of the house and make it to the office  Please advise

## 2019-12-13 NOTE — Telephone Encounter (Signed)
Spoke to him Letter done for him for the [employer]. Will hold off on appt for now--as long as he sees the cardiologist

## 2019-12-13 NOTE — Telephone Encounter (Signed)
I don't think I could get out until sometime at the end of the month or early February. He will definitely need to get out to see cardiologist---can defer my visit if he at least sees the cardiologist

## 2019-12-13 NOTE — Telephone Encounter (Signed)
Tried to call patient, was able to leave a message due to no voicemail being set up  Needed to give message from Dr Silvio Pate

## 2019-12-15 ENCOUNTER — Telehealth: Payer: Self-pay | Admitting: Internal Medicine

## 2019-12-15 NOTE — Telephone Encounter (Signed)
Patient's son aware that Dr.Letvak has left for the day.

## 2019-12-15 NOTE — Telephone Encounter (Signed)
Patient's son,Kurt [name],called.  Patient was released from the hospital on 12/11/19. Patient had a heart attack. Patient was suppose to be set up for physical therapy by the hospital.  Hospital told patient they couldn't do physical therapy due to substance abuse. The family doesn't know where this information came from and when patient was in the hospital he didn't test positive for anything.  Patient would like to be referred for physical therapy for strengthening and endurance. Patient's son is requesting a call back to patient's home number.  If they don't answer at home call patient's cell phone [phone].  Patient's son is leaving for [place] on Monday. Patient lives in [place].

## 2019-12-17 NOTE — Telephone Encounter (Signed)
Please let them know that there was a history given (by someone) that he had been using cocaine before the heart attack ----but the test in the hospital did not show any. He really should be getting cardiac rehab---not just PT but this should be set up by the cardiologist. Just make sure he has a follow up with cardiologist soon.

## 2019-12-18 NOTE — Telephone Encounter (Signed)
Spoken and notified patient of Dr Alla German comments. Patient verbalized understanding and will call then update if needed

## 2019-12-18 NOTE — Telephone Encounter (Signed)
Please let him know that he was seen by a [facility] clinic cardiologist who I can't directly correspond with. I will look into having him see one of our cardiologists unless Dr Saralyn Pilar plans to see him in the office also

## 2019-12-18 NOTE — Telephone Encounter (Signed)
Patient's son is already on his way to [place] early this morning. Spoken to patient and he stated that when he was told at the hospital that he should be getting a call to set up with cardiology. Patient have not heard back. Please advise.

## 2019-12-21 ENCOUNTER — Telehealth: Payer: Self-pay

## 2019-12-21 NOTE — Telephone Encounter (Signed)
Spoke to him. He is back in [place] and in [employer] [place] for now He states the [employer] did not accept the letter as I wrote it. He is going to get me a copy of the email they sent him (brother going to drop it off here or something) and I can write another letter based on what information they need

## 2019-12-21 NOTE — Telephone Encounter (Signed)
Patient's son contacted the office and stated he really needed to speak with Dr. Silvio Pate regarding an urgent matter. I asked if there was anything I could help him with, and he states that he would just really like to speak with Dr. Silvio Pate, and he would not disclose any information to me. He states he can be reached at [phone]. Will route to Dr. Silvio Pate.

## 2019-12-22 NOTE — Telephone Encounter (Signed)
Placed letter/form in Dr Alla German inbox

## 2019-12-22 NOTE — Telephone Encounter (Signed)
Letter was dropped off. Placed in [place] tower.

## 2019-12-26 ENCOUNTER — Encounter: Payer: Self-pay | Admitting: Internal Medicine

## 2019-12-26 NOTE — Telephone Encounter (Signed)
Please inform him and sons that the new letter is ready. No charge for this one

## 2019-12-26 NOTE — Telephone Encounter (Signed)
I spoke to patient's son and informed him letter is ready for pick up.

## 2020-01-10 ENCOUNTER — Telehealth: Payer: Self-pay | Admitting: Internal Medicine

## 2020-01-10 MED ORDER — LISINOPRIL 5 MG PO TABS: 5.0000 mg | ORAL_TABLET | Freq: Every day | ORAL | 3 refills | Status: DC

## 2020-01-10 MED ORDER — TICAGRELOR 90 MG PO TABS: 90.0000 mg | ORAL_TABLET | Freq: Two times a day (BID) | ORAL | 0 refills | Status: DC

## 2020-01-10 MED ORDER — METOPROLOL TARTRATE 50 MG PO TABS: 50.0000 mg | ORAL_TABLET | Freq: Two times a day (BID) | ORAL | 3 refills | Status: DC

## 2020-01-10 MED ORDER — ROSUVASTATIN CALCIUM 40 MG PO TABS: 40.0000 mg | ORAL_TABLET | Freq: Every day | ORAL | 3 refills | Status: DC

## 2020-01-10 MED ORDER — FOLIC ACID 1 MG PO TABS: 1.0000 mg | ORAL_TABLET | Freq: Every day | ORAL | 3 refills | Status: DC

## 2020-01-10 NOTE — Telephone Encounter (Signed)
Spoke to pt. He has an OV here 01-15-20 for Hospital F/U. He sees cardiology near the end of the month. He will let them know about the Brilinta.

## 2020-01-10 NOTE — Telephone Encounter (Signed)
Patient stated that he recently had a heart attack and was prescribed several medications at the hospital that he is now out of. He called his pharmacy and they stated they would have to try to get in contact with the Dr at the hospital or he would need to contact his PCP.  Patient wanted to know if you could send in refills for these or if he needs to wait until Monday to discuss them with you at his appointment  Please advise   Rosuvastatin 40mg  Metoprolol tartrate 400mg  Ticagrelor 90mg  Lisinopril 5mg  Folic Acid 1mg     [place]

## 2020-01-10 NOTE — Telephone Encounter (Signed)
Okay to fill all these for a year other than the ticragelor---just do #30 x 0 and have the pharmacy confirm further Rx with his cardiologist Dr Clayborn Bigness.  See if he will be able to come in for a visit here in the next 1-2 months

## 2020-01-15 ENCOUNTER — Encounter: Payer: Self-pay | Admitting: Internal Medicine

## 2020-01-15 ENCOUNTER — Ambulatory Visit (INDEPENDENT_AMBULATORY_CARE_PROVIDER_SITE_OTHER): Payer: 59 | Admitting: Internal Medicine

## 2020-01-15 ENCOUNTER — Other Ambulatory Visit: Payer: Self-pay

## 2020-01-15 VITALS — BP 140/86 | HR 81 | Temp 98.4°F | Ht 65.5 in | Wt 165.0 lb

## 2020-01-15 DIAGNOSIS — I2510 Atherosclerotic heart disease of native coronary artery without angina pectoris: Secondary | ICD-10-CM

## 2020-01-15 DIAGNOSIS — J449 Chronic obstructive pulmonary disease, unspecified: Secondary | ICD-10-CM | POA: Diagnosis not present

## 2020-01-15 DIAGNOSIS — Z0000 Encounter for general adult medical examination without abnormal findings: Secondary | ICD-10-CM | POA: Diagnosis not present

## 2020-01-15 NOTE — Patient Instructions (Signed)
You can try melatonin at bedtime--- try 3 or 5mg  nightly for a while and increase if it doesn't help (and no side effects). You can go up to a maximum of 10mg .

## 2020-01-15 NOTE — Progress Notes (Signed)
Subjective:    Patient ID: Kurt [name], male    DOB: Sep 26, 1956, 64 y.o.   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 aspirin 81 MG tablet Take 81 mg by mouth daily.    . folic acid (FOLVITE) 1 MG tablet Take 1 tablet (1 mg total) by mouth daily. 90 tablet 3  . lisinopril (ZESTRIL) 5 MG tablet Take 1 tablet (5 mg total) by mouth daily. 90 tablet 3  . magnesium oxide (MAG-OX) 400 (241.3 Mg) MG tablet Take 1 tablet (400 mg total) by mouth daily. 30 tablet 0  . metoprolol tartrate (LOPRESSOR) 50 MG tablet Take 1 tablet (50 mg total) by mouth 2 (two) times daily. 90 tablet 3  . Multiple Vitamin (MULTIVITAMIN WITH MINERALS) TABS tablet Take 1 tablet by mouth daily. 30 tablet 0  . nicotine (NICODERM CQ - DOSED IN MG/24 HOURS) 21 mg/24hr patch Place 1 patch (21 mg total) onto the skin daily. 14 patch 0  . rosuvastatin (CRESTOR) 40 MG tablet Take 1 tablet (40 mg total) by mouth daily at 6 PM. 90 tablet 3  . thiamine 100 MG tablet Take 1 tablet (100 mg total) by mouth daily. 30 tablet 0  . ticagrelor (BRILINTA) 90 MG TABS tablet Take 1 tablet (90 mg total) by mouth 2 (two) times daily. 60 tablet 0   No current facility-administered medications on file prior to visit.    No Known Allergies  Past Medical History:  Diagnosis Date  . COPD (chronic obstructive  pulmonary disease) ([place])   . ED (erectile dysfunction)   . Hypertension   . Lumbar disc disease     Past Surgical History:  Procedure Laterality Date  . APPENDECTOMY    . CORONARY STENT INTERVENTION N/A 11/23/2019   Procedure: CORONARY STENT INTERVENTION;  Surgeon: Yolonda Kida, MD;  Location: [facility];  Service: Cardiovascular;  Laterality: N/A;  RCA  . CORONARY/GRAFT ACUTE MI REVASCULARIZATION N/A 11/23/2019   Procedure: Coronary/Graft Acute MI Revascularization;  Surgeon: Yolonda Kida, MD;  Location: [facility];  Service: Cardiovascular;  Laterality: N/A;  . LEFT HEART CATH AND CORONARY ANGIOGRAPHY N/A 11/23/2019   Procedure: LEFT HEART CATH AND CORONARY ANGIOGRAPHY;  Surgeon: Yolonda Kida, MD;  Location: [facility];  Service: Cardiovascular;  Laterality: N/A;    Family History  Problem Relation Age of Onset  . Hyperlipidemia Father   . Cancer Father   . Heart disease Father        heart valve disease (from Agent Orange?)  . Alcohol abuse Mother   . Hypertension Neg Hx   . Diabetes Neg Hx   . Colon cancer Neg Hx   . Colon polyps Neg Hx   . Pancreatic cancer Neg Hx   . Rectal cancer Neg Hx   . Stomach cancer Neg Hx     Social History   Socioeconomic  History  . Marital status: Widowed    Spouse name: Not on file  . Number of children: 1  . Years of education: Not on file  . Highest education level: Not on file  Occupational History  . 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 Smoking status: Current Every Day Smoker  . Smokeless tobacco: Never Used  . Tobacco comment: Using the patch several days a week  Substance and Sexual Activity  . Alcohol use: Yes  . Drug use: Yes    Types: Marijuana  . Sexual activity: Not on file  Other Topics Concern  . Not on file  Social History Narrative   Widowed 2020   Social Determinants of Health   Financial Resource Strain:   .  Difficulty of Paying Living Expenses: Not on file  Food Insecurity:   . Worried About Charity fundraiser in the Last Year: Not on file  . Ran Out of Food in the Last Year: Not on file  Transportation Needs:   . Lack of Transportation (Medical): Not on file  . Lack of Transportation (Non-Medical): Not on file  Physical Activity:   . Days of Exercise per Week: Not on file  . Minutes of Exercise per Session: Not on file  Stress:   . Feeling of Stress : Not on file  Social Connections:   . Frequency of Communication with Friends and Family: Not on file  . Frequency of Social Gatherings with Friends and Family: Not on file  . Attends Religious Services: Not on file  . Active Member of Clubs or Organizations: Not on file  . Attends Archivist Meetings: Not on file  . Marital Status: Not on file  Intimate Partner Violence:   . Fear of Current or Ex-Partner: Not on file  . Emotionally Abused: Not on file  . Physically Abused: Not on file  . Sexually Abused: Not on file   Review of Systems  Constitutional: Positive for fatigue. Negative for unexpected weight change.       Wears seat belt  HENT: Negative for hearing loss and tinnitus.        Broke teeth with sudden death--needs a lot of work  Eyes: Negative for visual disturbance.       No diplopia or unilateral vision loss  Respiratory: Negative for shortness of breath.        Still with regular cough and sputum  Cardiovascular: Negative for palpitations and leg swelling.       Still gets chest pain with a sneeze   Gastrointestinal: Negative for abdominal pain, blood in stool and constipation.       No heartburn  Endocrine: Positive for cold intolerance. Negative for polydipsia and polyuria.  Genitourinary: Negative for difficulty urinating and urgency.  Musculoskeletal: Positive for back pain. Negative for arthralgias and joint swelling.  Skin: Negative for rash.       No suspicious lesions  Allergic/Immunologic:  Negative for environmental allergies and immunocompromised state.  Neurological: Negative for dizziness, syncope and light-headedness.       Occ headaches  Hematological: Negative for adenopathy. Bruises/bleeds easily.  Psychiatric/Behavioral: Positive for sleep disturbance.       Moving through grieving       Objective:   Physical Exam  Constitutional: He is oriented to person, place, and time. He appears well-developed. No distress.  HENT:  Head: Normocephalic and atraumatic.  Right Ear: External ear normal.  Left Ear: External ear normal.  Mouth/Throat: Oropharynx is clear and moist. No oropharyngeal exudate.  Eyes: Pupils are equal, round, and reactive to light. Conjunctivae are normal.  Neck: No thyromegaly present.  Cardiovascular: Normal rate, regular rhythm and normal heart sounds. Exam reveals no gallop.  No murmur heard. 1+ pulse left foot, trace on right  Respiratory: Effort normal. No respiratory distress. He has no rales.  Decreased breath sounds Slight audible wheeze when he is supine  GI: Soft. There is no abdominal tenderness.  Musculoskeletal:        General: No tenderness or edema.  Lymphadenopathy:    He has no cervical adenopathy.  Neurological: He is alert and oriented to person, place, and time.  Skin: No rash noted. No erythema.  Psychiatric: He has a normal mood and affect. His behavior is normal.           Assessment & Plan:

## 2020-01-15 NOTE — Assessment & Plan Note (Signed)
Survived a sudden death episode Stopped drinking Working on the smoking Yearly flu vaccine Discussed getting COVID vaccine Discussed PSA----will hold off Colon due 2024

## 2020-01-15 NOTE — Assessment & Plan Note (Signed)
Cardiac arrest then stent On statin, ACEI, brilinta, beta blocker, asa

## 2020-01-15 NOTE — Assessment & Plan Note (Signed)
Discussed patch/nicotine lozenge for cigarette cessation

## 2020-01-16 LAB — COMPREHENSIVE METABOLIC PANEL
ALT: 23 U/L (ref 0–53)
AST: 19 U/L (ref 0–37)
Albumin: 3.9 g/dL (ref 3.5–5.2)
Alkaline Phosphatase: 71 U/L (ref 39–117)
BUN: 17 mg/dL (ref 6–23)
CO2: 30 mEq/L (ref 19–32)
Calcium: 9.6 mg/dL (ref 8.4–10.5)
Chloride: 110 mEq/L (ref 96–112)
Creatinine, Ser: 0.89 mg/dL (ref 0.40–1.50)
GFR: 86.8 mL/min (ref >60.00)
Glucose, Bld: 76 mg/dL (ref 70–99)
Potassium: 5.1 mEq/L (ref 3.5–5.1)
Sodium: 145 mEq/L (ref 135–145)
Total Bilirubin: 0.4 mg/dL (ref 0.2–1.2)
Total Protein: 7.1 g/dL (ref 6.0–8.3)

## 2020-01-16 LAB — LIPID PANEL
Cholesterol: 115 mg/dL (ref 0–200)
HDL: 36.8 mg/dL — ABNORMAL LOW (ref >39.00)
LDL Cholesterol: 59 mg/dL (ref 0–99)
NonHDL: 78.02
Total CHOL/HDL Ratio: 3
Triglycerides: 95 mg/dL (ref 0.0–149.0)
VLDL: 19 mg/dL (ref 0.0–40.0)

## 2020-01-16 LAB — CBC
HCT: 37.2 % — ABNORMAL LOW (ref 39.0–52.0)
Hemoglobin: 12.4 g/dL — ABNORMAL LOW (ref 13.0–17.0)
MCHC: 33.4 g/dL (ref 30.0–36.0)
MCV: 96.7 fl (ref 78.0–100.0)
Platelets: 284 10*3/uL (ref 150.0–400.0)
RBC: 3.84 Mil/uL — ABNORMAL LOW (ref 4.22–5.81)
RDW: 14.2 % (ref 11.5–15.5)
WBC: 8 10*3/uL (ref 4.0–10.5)

## 2020-01-30 ENCOUNTER — Other Ambulatory Visit: Payer: Self-pay

## 2020-01-30 ENCOUNTER — Encounter: Payer: 59 | Attending: Internal Medicine | Admitting: *Deleted

## 2020-01-30 ENCOUNTER — Encounter: Payer: Self-pay | Admitting: *Deleted

## 2020-01-30 DIAGNOSIS — I213 ST elevation (STEMI) myocardial infarction of unspecified site: Secondary | ICD-10-CM | POA: Insufficient documentation

## 2020-01-30 DIAGNOSIS — Z7902 Long term (current) use of antithrombotics/antiplatelets: Secondary | ICD-10-CM | POA: Insufficient documentation

## 2020-01-30 DIAGNOSIS — Z79899 Other long term (current) drug therapy: Secondary | ICD-10-CM | POA: Insufficient documentation

## 2020-01-30 DIAGNOSIS — J449 Chronic obstructive pulmonary disease, unspecified: Secondary | ICD-10-CM | POA: Insufficient documentation

## 2020-01-30 DIAGNOSIS — F17210 Nicotine dependence, cigarettes, uncomplicated: Secondary | ICD-10-CM | POA: Insufficient documentation

## 2020-01-30 DIAGNOSIS — Z7982 Long term (current) use of aspirin: Secondary | ICD-10-CM | POA: Insufficient documentation

## 2020-01-30 DIAGNOSIS — I10 Essential (primary) hypertension: Secondary | ICD-10-CM | POA: Insufficient documentation

## 2020-01-30 NOTE — Progress Notes (Signed)
Virtual Orientation completed today Has appointment for EP Eval and Gym Orientation on 3/3. Documentation of diagnosis can be found in [facility] 11/23/2019

## 2020-01-31 VITALS — Ht 67.0 in | Wt 166.8 lb

## 2020-01-31 DIAGNOSIS — F17210 Nicotine dependence, cigarettes, uncomplicated: Secondary | ICD-10-CM | POA: Diagnosis not present

## 2020-01-31 DIAGNOSIS — I10 Essential (primary) hypertension: Secondary | ICD-10-CM | POA: Diagnosis not present

## 2020-01-31 DIAGNOSIS — Z79899 Other long term (current) drug therapy: Secondary | ICD-10-CM | POA: Diagnosis not present

## 2020-01-31 DIAGNOSIS — I213 ST elevation (STEMI) myocardial infarction of unspecified site: Secondary | ICD-10-CM

## 2020-01-31 DIAGNOSIS — Z7902 Long term (current) use of antithrombotics/antiplatelets: Secondary | ICD-10-CM | POA: Diagnosis not present

## 2020-01-31 DIAGNOSIS — Z7982 Long term (current) use of aspirin: Secondary | ICD-10-CM | POA: Diagnosis not present

## 2020-01-31 DIAGNOSIS — J449 Chronic obstructive pulmonary disease, unspecified: Secondary | ICD-10-CM | POA: Diagnosis not present

## 2020-01-31 NOTE — Patient Instructions (Signed)
Patient Instructions  Patient Details  Name: Kurt [name] MRN: [id number] Date of Birth: 30-Sep-1956 Referring Provider:  Yolonda Kida, MD  Below are your personal goals for exercise, nutrition, and risk factors. Our goal is to help you stay on track towards obtaining and maintaining these goals. We will be discussing your progress on these goals with you throughout the program.  Initial Exercise Prescription: Initial Exercise Prescription - 01/31/20 1400      Date of Initial Exercise RX and Referring Provider   Date  01/31/20    Referring Provider  [facility]      Treadmill   MPH  2.4    Grade  1    Minutes  15    METs  3.2      Recumbant Bike   Level  1    RPM  60    Minutes  15    METs  3.2      Elliptical   Level  1    Speed  3    Minutes  15      REL-XR   Level  3    Speed  50    Minutes  15    METs  3.2      Prescription Details   Frequency (times per week)  2    Duration  Progress to 30 minutes of continuous aerobic without signs/symptoms of physical distress      Intensity   THRR 40-80% of Max Heartrate  102-140    Ratings of Perceived Exertion  11-13    Perceived Dyspnea  0-4      Resistance Training   Training Prescription  Yes    Weight   3lb    Reps  10-15       Exercise Goals: Frequency: Be able to perform aerobic exercise two to three times per week in program working toward 2-5 days per week of home exercise.  Intensity: Work with a perceived exertion of 11 (fairly light) - 15 (hard) while following your exercise prescription.  We will make changes to your prescription with you as you progress through the program.   Duration: Be able to do 30 to 45 minutes of continuous aerobic exercise in addition to a 5 minute warm-up and a 5 minute cool-down routine.   Nutrition Goals: Your personal nutrition goals will be established when you do your nutrition analysis with the dietician.  The following are general nutrition guidelines to  follow: Cholesterol < 200mg /day Sodium < 1500mg /day Fiber: Men over 50 yrs - 30 grams per day  Personal Goals: Personal Goals and Risk Factors at Admission - 01/31/20 1454      Core Components/Risk Factors/Patient Goals on Admission    Weight Management  Yes;Weight Maintenance    Admit Weight  166 lb 12.8 oz (75.7 kg)    Goal Weight: Short Term  165 lb (74.8 kg)    Goal Weight: Long Term  165 lb (74.8 kg)    Expected Outcomes  Short Term: Continue to assess and modify interventions until short term weight is achieved;Weight Maintenance: Understanding of the daily nutrition guidelines, which includes 25-35% calories from fat, 7% or less cal from saturated fats, less than 200mg  cholesterol, less than 1.5gm of sodium, & 5 or more servings of fruits and vegetables daily;Long Term: Adherence to nutrition and physical activity/exercise program aimed toward attainment of established weight goal    Intervention  Assist the participant in steps to quit. Provide individualized education and counseling about committing  to Tobacco Cessation, relapse prevention, and pharmacological support that can be provided by physician.;Advice worker, assist with locating and accessing local/national Quit Smoking programs, and support quit date choice.    Expected Outcomes  Short Term: Will demonstrate readiness to quit, by selecting a quit date.;Short Term: Will quit all tobacco product use, adhering to prevention of relapse plan.;Long Term: Complete abstinence from all tobacco products for at least 12 months from quit date.    Intervention  Provide education on lifestyle modifcations including regular physical activity/exercise, weight management, moderate sodium restriction and increased consumption of fresh fruit, vegetables, and low fat dairy, alcohol moderation, and smoking cessation.;Monitor prescription use compliance.    Expected Outcomes  Short Term: Continued assessment and intervention until BP is  < 140/12mm HG in hypertensive participants. < 130/9mm HG in hypertensive participants with diabetes, heart failure or chronic kidney disease.;Long Term: Maintenance of blood pressure at goal levels.    Intervention  Provide education and support for participant on nutrition & aerobic/resistive exercise along with prescribed medications to achieve LDL 70mg , HDL >40mg .    Expected Outcomes  Short Term: Participant states understanding of desired cholesterol values and is compliant with medications prescribed. Participant is following exercise prescription and nutrition guidelines.;Long Term: Cholesterol controlled with medications as prescribed, with individualized exercise RX and with personalized nutrition plan. Value goals: LDL < 70mg , HDL > 40 mg.       Tobacco Use Initial Evaluation: Social History   Tobacco Use  Smoking Status Current Every Day Smoker  . Packs/day: 0.50  . Years: 45.00  . Pack years: 22.50  Smokeless Tobacco Never Used  Tobacco Comment   Using the patch several days a week    Exercise Goals and Review: Exercise Goals    Row Name 01/31/20 1452             Exercise Goals   Increase Physical Activity  Yes       Intervention  Provide advice, education, support and counseling about physical activity/exercise needs.;Develop an individualized exercise prescription for aerobic and resistive training based on initial evaluation findings, risk stratification, comorbidities and participant's personal goals.       Expected Outcomes  Short Term: Attend rehab on a regular basis to increase amount of physical activity.;Long Term: Add in home exercise to make exercise part of routine and to increase amount of physical activity.;Long Term: Exercising regularly at least 3-5 days a week.       Increase Strength and Stamina  Yes       Intervention  Provide advice, education, support and counseling about physical activity/exercise needs.;Develop an individualized exercise  prescription for aerobic and resistive training based on initial evaluation findings, risk stratification, comorbidities and participant's personal goals.       Expected Outcomes  Short Term: Increase workloads from initial exercise prescription for resistance, speed, and METs.;Long Term: Improve cardiorespiratory fitness, muscular endurance and strength as measured by increased METs and functional capacity (6MWT)       Able to understand and use rate of perceived exertion (RPE) scale  Yes       Intervention  Provide education and explanation on how to use RPE scale       Expected Outcomes  Short Term: Able to use RPE daily in rehab to express subjective intensity level;Long Term:  Able to use RPE to guide intensity level when exercising independently       Able to understand and use Dyspnea scale  Yes  Intervention  Provide education and explanation on how to use Dyspnea scale       Expected Outcomes  Short Term: Able to use Dyspnea scale daily in rehab to express subjective sense of shortness of breath during exertion;Long Term: Able to use Dyspnea scale to guide intensity level when exercising independently       Knowledge and understanding of Target Heart Rate Range (THRR)  Yes       Intervention  Provide education and explanation of THRR including how the numbers were predicted and where they are located for reference       Expected Outcomes  Short Term: Able to state/look up THRR;Short Term: Able to use daily as guideline for intensity in rehab;Long Term: Able to use THRR to govern intensity when exercising independently       Able to check pulse independently  Yes       Intervention  Provide education and demonstration on how to check pulse in carotid and radial arteries.;Review the importance of being able to check your own pulse for safety during independent exercise       Expected Outcomes  Short Term: Able to explain why pulse checking is important during independent exercise;Long Term:  Able to check pulse independently and accurately       Understanding of Exercise Prescription  Yes       Intervention  Provide education, explanation, and written materials on patient's individual exercise prescription       Expected Outcomes  Short Term: Able to explain program exercise prescription;Long Term: Able to explain home exercise prescription to exercise independently          Copy of goals given to participant.

## 2020-01-31 NOTE — Progress Notes (Signed)
Cardiac Individual Treatment Plan  Patient Details  Name: Kurt [name] MRN: [id number] Date of Birth: 1956/11/18 Referring Provider:     Cardiac Rehab from 01/31/2020 in [facility]  Referring Provider  [facility]      Initial Encounter Date:    Cardiac Rehab from 01/31/2020 in [facility]  Date  01/31/20      Visit Diagnosis: ST elevation myocardial infarction (STEMI), unspecified artery ([place])  Patient's Home Medications on Admission:  Current Outpatient Medications:  .  traMADol (ULTRAM) 50 MG tablet, Take by mouth every 8 (eight) hours as needed., Disp: , Rfl:  .  aspirin 81 MG tablet, Take 81 mg by mouth daily., Disp: , Rfl:  .  folic acid (FOLVITE) 1 MG tablet, Take 1 tablet (1 mg total) by mouth daily., Disp: 90 tablet, Rfl: 3 .  lisinopril (ZESTRIL) 5 MG tablet, Take 1 tablet (5 mg total) by mouth daily., Disp: 90 tablet, Rfl: 3 .  magnesium oxide (MAG-OX) 400 (241.3 Mg) MG tablet, Take 1 tablet (400 mg total) by mouth daily. (Patient not taking: Reported on 01/30/2020), Disp: 30 tablet, Rfl: 0 .  metoprolol tartrate (LOPRESSOR) 50 MG tablet, Take 1 tablet (50 mg total) by mouth 2 (two) times daily., Disp: 90 tablet, Rfl: 3 .  Multiple Vitamin (MULTIVITAMIN WITH MINERALS) TABS tablet, Take 1 tablet by mouth daily., Disp: 30 tablet, Rfl: 0 .  nicotine (NICODERM CQ - DOSED IN MG/24 HOURS) 21 mg/24hr patch, Place 1 patch (21 mg total) onto the skin daily., Disp: 14 patch, Rfl: 0 .  rosuvastatin (CRESTOR) 40 MG tablet, Take 1 tablet (40 mg total) by mouth daily at 6 PM., Disp: 90 tablet, Rfl: 3 .  thiamine 100 MG tablet, Take 1 tablet (100 mg total) by mouth daily., Disp: 30 tablet, Rfl: 0 .  ticagrelor (BRILINTA) 90 MG TABS tablet, Take 1 tablet (90 mg total) by mouth 2 (two) times daily., Disp: 60 tablet, Rfl: 0  Past Medical History: Past Medical History:  Diagnosis Date  . COPD (chronic obstructive pulmonary disease) ([place])   . ED  (erectile dysfunction)   . Hypertension   . Lumbar disc disease     Tobacco Use: Social History   Tobacco Use  Smoking Status Current Every Day Smoker  . Packs/day: 0.50  . Years: 45.00  . Pack years: 22.50  Smokeless Tobacco Never Used  Tobacco Comment   Using the patch several days a week    Labs: Recent Review Flowsheet Data    Labs for ITP Cardiac and Pulmonary Rehab Latest Ref Rng & Units 11/24/2019 11/26/2019 11/27/2019 11/30/2019 01/15/2020   Cholestrol 0 - 200 mg/dL - - - - 115   LDLCALC 0 - 99 mg/dL - - - - 59   HDL >39.00 mg/dL - - - - 36.80(L)   Trlycerides 0.0 - 149.0 mg/dL - 227(H) - 100 95.0   Hemoglobin A1c 4.8 - 5.6 % - - - - -   PHART 7.350 - 7.450 7.29(L) - 7.53(H) - -   PCO2ART 32.0 - 48.0 mmHg 54(H) - 35 - -   HCO3 20.0 - 28.0 mmol/L 26.3 - 29.2(H) - -   ACIDBASEDEF 0.0 - 2.0 mmol/L 1.4 - - - -   O2SAT % 99.3 - 97.7 - -       Exercise Target Goals: Exercise Program Goal: Individual exercise prescription set using results from initial 6 min walk test and THRR while considering  patient's activity barriers and  safety.   Exercise Prescription Goal: Initial exercise prescription builds to 30-45 minutes a day of aerobic activity, 2-3 days per week.  Home exercise guidelines will be given to patient during program as part of exercise prescription that the participant will acknowledge.  Activity Barriers & Risk Stratification: Activity Barriers & Cardiac Risk Stratification - 01/30/20 1423      Activity Barriers & Cardiac Risk Stratification   Activity Barriers  Muscular Weakness;Deconditioning;Shortness of Breath;Back Problems   Disc missing  bone on bone   Cardiac Risk Stratification  High       6 Minute Walk: 6 Minute Walk    Row Name 01/31/20 1448         6 Minute Walk   Phase  Initial     Distance  1280 feet     Walk Time  6 minutes     # of Rest Breaks  0     MPH  2.42     METS  3.36     RPE  9     Perceived Dyspnea   1     VO2 Peak   11.76     Symptoms  Yes (comment)     Comments  chest pain 3/10 ( has been since MI - has spoken to Dr)  f/u stress test and echo next week     Resting HR  64 bpm     Resting BP  134/84     Resting Oxygen Saturation   97 %     Exercise Oxygen Saturation  during 6 min walk  96 %     Max Ex. HR  80 bpm     Max Ex. BP  144/86     2 Minute Post BP  132/86        Oxygen Initial Assessment:   Oxygen Re-Evaluation:   Oxygen Discharge (Final Oxygen Re-Evaluation):   Initial Exercise Prescription: Initial Exercise Prescription - 01/31/20 1400      Date of Initial Exercise RX and Referring Provider   Date  01/31/20    Referring Provider  [facility]      Treadmill   MPH  2.4    Grade  1    Minutes  15    METs  3.2      Recumbant Bike   Level  1    RPM  60    Minutes  15    METs  3.2      Elliptical   Level  1    Speed  3    Minutes  15      REL-XR   Level  3    Speed  50    Minutes  15    METs  3.2      Prescription Details   Frequency (times per week)  2    Duration  Progress to 30 minutes of continuous aerobic without signs/symptoms of physical distress      Intensity   THRR 40-80% of Max Heartrate  102-140    Ratings of Perceived Exertion  11-13    Perceived Dyspnea  0-4      Resistance Training   Training Prescription  Yes    Weight   3lb    Reps  10-15       Perform Capillary Blood Glucose checks as needed.  Exercise Prescription Changes: Exercise Prescription Changes    Row Name 01/31/20 1400             Response to  Exercise   Blood Pressure (Admit)  134/84       Blood Pressure (Exercise)  144/86       Blood Pressure (Exit)  132/86       Heart Rate (Admit)  64 bpm       Heart Rate (Exercise)  80 bpm       Heart Rate (Exit)  67 bpm       Oxygen Saturation (Admit)  97 %       Oxygen Saturation (Exercise)  96 %       Rating of Perceived Exertion (Exercise)  9       Perceived Dyspnea (Exercise)  1       Symptoms  chest pain3/10        Comments  follow up stress test next week          Exercise Comments:   Exercise Goals and Review: Exercise Goals    Row Name 01/31/20 1452             Exercise Goals   Increase Physical Activity  Yes       Intervention  Provide advice, education, support and counseling about physical activity/exercise needs.;Develop an individualized exercise prescription for aerobic and resistive training based on initial evaluation findings, risk stratification, comorbidities and participant's personal goals.       Expected Outcomes  Short Term: Attend rehab on a regular basis to increase amount of physical activity.;Long Term: Add in home exercise to make exercise part of routine and to increase amount of physical activity.;Long Term: Exercising regularly at least 3-5 days a week.       Increase Strength and Stamina  Yes       Intervention  Provide advice, education, support and counseling about physical activity/exercise needs.;Develop an individualized exercise prescription for aerobic and resistive training based on initial evaluation findings, risk stratification, comorbidities and participant's personal goals.       Expected Outcomes  Short Term: Increase workloads from initial exercise prescription for resistance, speed, and METs.;Long Term: Improve cardiorespiratory fitness, muscular endurance and strength as measured by increased METs and functional capacity (6MWT)       Able to understand and use rate of perceived exertion (RPE) scale  Yes       Intervention  Provide education and explanation on how to use RPE scale       Expected Outcomes  Short Term: Able to use RPE daily in rehab to express subjective intensity level;Long Term:  Able to use RPE to guide intensity level when exercising independently       Able to understand and use Dyspnea scale  Yes       Intervention  Provide education and explanation on how to use Dyspnea scale       Expected Outcomes  Short Term: Able to use Dyspnea  scale daily in rehab to express subjective sense of shortness of breath during exertion;Long Term: Able to use Dyspnea scale to guide intensity level when exercising independently       Knowledge and understanding of Target Heart Rate Range (THRR)  Yes       Intervention  Provide education and explanation of THRR including how the numbers were predicted and where they are located for reference       Expected Outcomes  Short Term: Able to state/look up THRR;Short Term: Able to use daily as guideline for intensity in rehab;Long Term: Able to use THRR to govern intensity when exercising independently  Able to check pulse independently  Yes       Intervention  Provide education and demonstration on how to check pulse in carotid and radial arteries.;Review the importance of being able to check your own pulse for safety during independent exercise       Expected Outcomes  Short Term: Able to explain why pulse checking is important during independent exercise;Long Term: Able to check pulse independently and accurately       Understanding of Exercise Prescription  Yes       Intervention  Provide education, explanation, and written materials on patient's individual exercise prescription       Expected Outcomes  Short Term: Able to explain program exercise prescription;Long Term: Able to explain home exercise prescription to exercise independently          Exercise Goals Re-Evaluation :   Discharge Exercise Prescription (Final Exercise Prescription Changes): Exercise Prescription Changes - 01/31/20 1400      Response to Exercise   Blood Pressure (Admit)  134/84    Blood Pressure (Exercise)  144/86    Blood Pressure (Exit)  132/86    Heart Rate (Admit)  64 bpm    Heart Rate (Exercise)  80 bpm    Heart Rate (Exit)  67 bpm    Oxygen Saturation (Admit)  97 %    Oxygen Saturation (Exercise)  96 %    Rating of Perceived Exertion (Exercise)  9    Perceived Dyspnea (Exercise)  1    Symptoms  chest  pain3/10    Comments  follow up stress test next week       Nutrition:  Target Goals: Understanding of nutrition guidelines, daily intake of sodium <1567m, cholesterol <2044m calories 30% from fat and 7% or less from saturated fats, daily to have 5 or more servings of fruits and vegetables.  Biometrics: Pre Biometrics - 01/31/20 1453      Pre Biometrics   Height  _0  (1.702 m)    Weight  166 lb 12.8 oz (75.7 kg)    BMI (Calculated)  26.12    Single Leg Stand  21.53 seconds        Nutrition Therapy Plan and Nutrition Goals:   Nutrition Assessments: Nutrition Assessments - 01/31/20 1452      MEDFICTS Scores   Pre Score  66       Nutrition Goals Re-Evaluation:   Nutrition Goals Discharge (Final Nutrition Goals Re-Evaluation):   Psychosocial: Target Goals: Acknowledge presence or absence of significant depression and/or stress, maximize coping skills, provide positive support system. Participant is able to verbalize types and ability to use techniques and skills needed for reducing stress and depression.   Initial Review & Psychosocial Screening: Initial Psych Review & Screening - 01/30/20 [place]   [place] [employer] [place] and is at [employer] most of day every day.      Quality of Life Scores:  Quality of Life - 01/31/20 1453      Quality of Life   Select  Quality of Life      Quality of Life Scores   Health/Function Pre  25.77 %    Socioeconomic Pre  27.56 %    Psych/Spiritual Pre  29.29 %    Family Pre  26 %    GLOBAL Pre  26.91 %      Scores of 19 and  below usually indicate a poorer quality of life in these areas.  A difference of  2-3 points is a clinically meaningful difference.  A difference of 2-3 points in the total score of the Quality of Life Index has been associated with significant improvement in overall quality of life, self-image, physical  symptoms, and general health in studies assessing change in quality of life.  PHQ-9: Recent Review Flowsheet Data    Depression screen [facility] 2/9 01/31/2020 01/15/2020 03/16/2018   Decreased Interest 0 0 0   Down, Depressed, Hopeless 0 0 0   PHQ - 2 Score 0 0 0   Altered sleeping 3 - -   Tired, decreased energy 3 - -   Change in appetite 0 - -   Feeling bad or failure about yourself  0 - -   Trouble concentrating 0 - -   Suicidal thoughts 0 - -   PHQ-9 Score 6 - -   Difficult doing work/chores Somewhat difficult - -     Interpretation of Total Score  Total Score Depression Severity:  1-4 = Minimal depression, 5-9 = Mild depression, 10-14 = Moderate depression, 15-19 = Moderately severe depression, 20-27 = Severe depression   Psychosocial Evaluation and Intervention: Psychosocial Evaluation - 01/30/20 1441      Psychosocial Evaluation & Interventions   Interventions  Encouraged to exercise with the program and follow exercise prescription;Relaxation education    Comments  Nikodem should do well with the program. He has no barriers and has a good support system.  He is wanting to QUIT smoking and has tried several times.  Hopefully we will be able to help him be successful with his desire to quit. His wife passed away last fall.  He does have a puuppy living with him.    Expected Outcomes  STG: Immanuel is able to attend program routinely. LTG Stony will discharge with the tools and resources to help him fight his heart disease    Continue Psychosocial Services   Follow up required by staff       Psychosocial Re-Evaluation:   Psychosocial Discharge (Final Psychosocial Re-Evaluation):   Vocational Rehabilitation: Provide vocational rehab assistance to qualifying candidates.   Vocational Rehab Evaluation & Intervention: Vocational Rehab - 01/30/20 1427      Initial Vocational Rehab Evaluation & Intervention   Assessment shows need for Vocational Rehabilitation  No        Education: Education Goals: Education classes will be provided on a variety of topics geared toward better understanding of heart health and risk factor modification. Participant will state understanding/return demonstration of topics presented as noted by education test scores.  Learning Barriers/Preferences: Learning Barriers/Preferences - 01/30/20 1427      Learning Barriers/Preferences   Learning Barriers  None    Learning Preferences  None       Education Topics:  AED/CPR: - Group verbal and written instruction with the use of models to demonstrate the basic use of the AED with the basic ABC's of resuscitation.   General Nutrition Guidelines/Fats and Fiber: -Group instruction provided by verbal, written material, models and posters to present the general guidelines for heart healthy nutrition. Gives an explanation and review of dietary fats and fiber.   Controlling Sodium/Reading Food Labels: -Group verbal and written material supporting the discussion of sodium use in heart healthy nutrition. Review and explanation with models, verbal and written materials for utilization of the food label.   Exercise Physiology & General Exercise Guidelines: - Group verbal and written  instruction with models to review the exercise physiology of the cardiovascular system and associated critical values. Provides general exercise guidelines with specific guidelines to those with heart or lung disease.    Aerobic Exercise & Resistance Training: - Gives group verbal and written instruction on the various components of exercise. Focuses on aerobic and resistive training programs and the benefits of this training and how to safely progress through these programs..   Flexibility, Balance, Mind/Body Relaxation: Provides group verbal/written instruction on the benefits of flexibility and balance training, including mind/body exercise modes such as yoga, pilates and tai chi.  Demonstration and  skill practice provided.   Stress and Anxiety: - Provides group verbal and written instruction about the health risks of elevated stress and causes of high stress.  Discuss the correlation between heart/lung disease and anxiety and treatment options. Review healthy ways to manage with stress and anxiety.   Depression: - Provides group verbal and written instruction on the correlation between heart/lung disease and depressed mood, treatment options, and the stigmas associated with seeking treatment.   Anatomy & Physiology of the Heart: - Group verbal and written instruction and models provide basic cardiac anatomy and physiology, with the coronary electrical and arterial systems. Review of Valvular disease and Heart Failure   Cardiac Procedures: - Group verbal and written instruction to review commonly prescribed medications for heart disease. Reviews the medication, class of the drug, and side effects. Includes the steps to properly store meds and maintain the prescription regimen. (beta blockers and nitrates)   Cardiac Medications I: - Group verbal and written instruction to review commonly prescribed medications for heart disease. Reviews the medication, class of the drug, and side effects. Includes the steps to properly store meds and maintain the prescription regimen.   Cardiac Medications II: -Group verbal and written instruction to review commonly prescribed medications for heart disease. Reviews the medication, class of the drug, and side effects. (all other drug classes)    Go Sex-Intimacy & Heart Disease, Get SMART - Goal Setting: - Group verbal and written instruction through game format to discuss heart disease and the return to sexual intimacy. Provides group verbal and written material to discuss and apply goal setting through the application of the S.M.A.R.T. Method.   Other Matters of the Heart: - Provides group verbal, written materials and models to describe Stable  Angina and Peripheral Artery. Includes description of the disease process and treatment options available to the cardiac patient.   Exercise & Equipment Safety: - Individual verbal instruction and demonstration of equipment use and safety with use of the equipment.   Cardiac Rehab from 01/31/2020 in [facility] Re[facility]  Date  01/31/20  Educator  AS  Instruction Review Code  1- Verbalizes Understanding      Infection Prevention: - Provides verbal and written material to individual with discussion of infection control including proper hand washing and proper equipment cleaning during exercise session.   Cardiac Rehab from 01/31/2020 in [facility] Cardiac and Pulmonary Rehab  Date  01/31/20  Educator  AS  Instruction Review Code  1- Verbalizes Understanding      Falls Prevention: - Provides verbal and written material to individual with discussion of falls prevention and safety.   Cardiac Rehab from 01/31/2020 in [facility] Cardiac and Pulmonary Rehab  Date  01/31/20  Educator  AS  Instruction Review Code  1- Verbalizes Understanding      Diabetes: - Individual verbal and written instruction to review signs/symptoms of diabetes, desired ranges  of glucose level fasting, after meals and with exercise. Acknowledge that pre and post exercise glucose checks will be done for 3 sessions at entry of program.   Know Your Numbers and Risk Factors: -Group verbal and written instruction about important numbers in your health.  Discussion of what are risk factors and how they play a role in the disease process.  Review of Cholesterol, Blood Pressure, Diabetes, and BMI and the role they play in your overall health.   Sleep Hygiene: -Provides group verbal and written instruction about how sleep can affect your health.  Define sleep hygiene, discuss sleep cycles and impact of sleep habits. Review good sleep hygiene tips.    Other: -Provides group and verbal instruction on various topics (see  comments)   Knowledge Questionnaire Score: Knowledge Questionnaire Score - 01/31/20 1454      Knowledge Questionnaire Score   Pre Score  23/26       Core Components/Risk Factors/Patient Goals at Admission: Personal Goals and Risk Factors at Admission - 01/31/20 1454      Core Components/Risk Factors/Patient Goals on Admission    Weight Management  Yes;Weight Maintenance    Admit Weight  166 lb 12.8 oz (75.7 kg)    Goal Weight: Short Term  165 lb (74.8 kg)    Goal Weight: Long Term  165 lb (74.8 kg)    Expected Outcomes  Short Term: Continue to assess and modify interventions until short term weight is achieved;Weight Maintenance: Understanding of the daily nutrition guidelines, which includes 25-35% calories from fat, 7% or less cal from saturated fats, less than 221m cholesterol, less than 1.5gm of sodium, & 5 or more servings of fruits and vegetables daily;Long Term: Adherence to nutrition and physical activity/exercise program aimed toward attainment of established weight goal    Intervention  Assist the participant in steps to quit. Provide individualized education and counseling about committing to Tobacco Cessation, relapse prevention, and pharmacological support that can be provided by physician.;OAdvice worker assist with locating and accessing local/national Quit Smoking programs, and support quit date choice.    Expected Outcomes  Short Term: Will demonstrate readiness to quit, by selecting a quit date.;Short Term: Will quit all tobacco product use, adhering to prevention of relapse plan.;Long Term: Complete abstinence from all tobacco products for at least 12 months from quit date.    Intervention  Provide education on lifestyle modifcations including regular physical activity/exercise, weight management, moderate sodium restriction and increased consumption of fresh fruit, vegetables, and low fat dairy, alcohol moderation, and smoking cessation.;Monitor prescription  use compliance.    Expected Outcomes  Short Term: Continued assessment and intervention until BP is < 140/942mHG in hypertensive participants. < 130/8037mG in hypertensive participants with diabetes, heart failure or chronic kidney disease.;Long Term: Maintenance of blood pressure at goal levels.    Intervention  Provide education and support for participant on nutrition & aerobic/resistive exercise along with prescribed medications to achieve LDL <63m13mDL >40mg72m Expected Outcomes  Short Term: Participant states understanding of desired cholesterol values and is compliant with medications prescribed. Participant is following exercise prescription and nutrition guidelines.;Long Term: Cholesterol controlled with medications as prescribed, with individualized exercise RX and with personalized nutrition plan. Value goals: LDL < 63mg,15m > 40 mg.       Core Components/Risk Factors/Patient Goals Review:    Core Components/Risk Factors/Patient Goals at Discharge (Final Review):    ITP Comments: ITP Comments    Row Name 01/30/20 1439  ITP Comments  Virtual Orientation completed today Has appointment for EP Eval and Gym Orientation on 3/3. Documentation of diagnosis can be found in [facility] 11/23/2019          Comments: initial ITP

## 2020-02-05 ENCOUNTER — Other Ambulatory Visit: Payer: Self-pay

## 2020-02-05 DIAGNOSIS — I213 ST elevation (STEMI) myocardial infarction of unspecified site: Secondary | ICD-10-CM

## 2020-02-09 NOTE — Progress Notes (Signed)
Completed Initial RD Eval 

## 2020-02-13 ENCOUNTER — Encounter: Payer: Self-pay | Admitting: *Deleted

## 2020-02-13 DIAGNOSIS — I213 ST elevation (STEMI) myocardial infarction of unspecified site: Secondary | ICD-10-CM

## 2020-02-15 ENCOUNTER — Encounter: Payer: 59 | Attending: Internal Medicine

## 2020-02-15 DIAGNOSIS — Z7982 Long term (current) use of aspirin: Secondary | ICD-10-CM | POA: Insufficient documentation

## 2020-02-15 DIAGNOSIS — F17210 Nicotine dependence, cigarettes, uncomplicated: Secondary | ICD-10-CM | POA: Insufficient documentation

## 2020-02-15 DIAGNOSIS — I213 ST elevation (STEMI) myocardial infarction of unspecified site: Secondary | ICD-10-CM | POA: Insufficient documentation

## 2020-02-15 DIAGNOSIS — J449 Chronic obstructive pulmonary disease, unspecified: Secondary | ICD-10-CM | POA: Insufficient documentation

## 2020-02-15 DIAGNOSIS — Z7902 Long term (current) use of antithrombotics/antiplatelets: Secondary | ICD-10-CM | POA: Insufficient documentation

## 2020-02-15 DIAGNOSIS — Z79899 Other long term (current) drug therapy: Secondary | ICD-10-CM | POA: Insufficient documentation

## 2020-02-15 DIAGNOSIS — I10 Essential (primary) hypertension: Secondary | ICD-10-CM | POA: Insufficient documentation

## 2020-02-21 ENCOUNTER — Telehealth: Payer: Self-pay

## 2020-02-21 ENCOUNTER — Encounter: Payer: Self-pay | Admitting: *Deleted

## 2020-02-21 DIAGNOSIS — I213 ST elevation (STEMI) myocardial infarction of unspecified site: Secondary | ICD-10-CM

## 2020-02-21 NOTE — Telephone Encounter (Signed)
Called to check in with pt as pt has not attended rehab since orientation 01/31/20. LM.

## 2020-02-21 NOTE — Progress Notes (Signed)
Cardiac Individual Treatment Plan  Patient Details  Name: Kurt [name] MRN: [id number] Date of Birth: August 19, 1956 Referring Provider:     Cardiac Rehab from 01/31/2020 in [facility] Cardiac and Pulmonary Rehab  Referring Provider  [facility]      Initial Encounter Date:    Cardiac Rehab from 01/31/2020 in [facility] Cardiac and Pulmonary Rehab  Date  01/31/20      Visit Diagnosis: ST elevation myocardial infarction (STEMI), unspecified artery ([place])  Patient's Home Medications on Admission:  Current Outpatient Medications:  .  aspirin 81 MG tablet, Take 81 mg by mouth daily., Disp: , Rfl:  .  folic acid (FOLVITE) 1 MG tablet, Take 1 tablet (1 mg total) by mouth daily., Disp: 90 tablet, Rfl: 3 .  lisinopril (ZESTRIL) 5 MG tablet, Take 1 tablet (5 mg total) by mouth daily., Disp: 90 tablet, Rfl: 3 .  magnesium oxide (MAG-OX) 400 (241.3 Mg) MG tablet, Take 1 tablet (400 mg total) by mouth daily. (Patient not taking: Reported on 01/30/2020), Disp: 30 tablet, Rfl: 0 .  metoprolol tartrate (LOPRESSOR) 50 MG tablet, Take 1 tablet (50 mg total) by mouth 2 (two) times daily., Disp: 90 tablet, Rfl: 3 .  Multiple Vitamin (MULTIVITAMIN WITH MINERALS) TABS tablet, Take 1 tablet by mouth daily., Disp: 30 tablet, Rfl: 0 .  nicotine (NICODERM CQ - DOSED IN MG/24 HOURS) 21 mg/24hr patch, Place 1 patch (21 mg total) onto the skin daily., Disp: 14 patch, Rfl: 0 .  rosuvastatin (CRESTOR) 40 MG tablet, Take 1 tablet (40 mg total) by mouth daily at 6 PM., Disp: 90 tablet, Rfl: 3 .  thiamine 100 MG tablet, Take 1 tablet (100 mg total) by mouth daily., Disp: 30 tablet, Rfl: 0 .  ticagrelor (BRILINTA) 90 MG TABS tablet, Take 1 tablet (90 mg total) by mouth 2 (two) times daily., Disp: 60 tablet, Rfl: 0 .  traMADol (ULTRAM) 50 MG tablet, Take by mouth every 8 (eight) hours as needed., Disp: , Rfl:   Past Medical History: Past Medical History:  Diagnosis Date  . COPD (chronic obstructive pulmonary disease) ([employer])   . ED  (erectile dysfunction)   . Hypertension   . Lumbar disc disease     Tobacco Use: Social History   Tobacco Use  Smoking Status Current Every Day Smoker  . Packs/day: 0.50  . Years: 45.00  . Pack years: 22.50  Smokeless Tobacco Never Used  Tobacco Comment   Using the patch several days a week    Labs: Recent Review Flowsheet Data    Labs for ITP Cardiac and Pulmonary Rehab Latest Ref Rng & Units 11/24/2019 11/26/2019 11/27/2019 11/30/2019 01/15/2020   Cholestrol 0 - 200 mg/dL - - - - 115   LDLCALC 0 - 99 mg/dL - - - - 59   HDL >39.00 mg/dL - - - - 36.80(L)   Trlycerides 0.0 - 149.0 mg/dL - 227(H) - 100 95.0   Hemoglobin A1c 4.8 - 5.6 % - - - - -   PHART 7.350 - 7.450 7.29(L) - 7.53(H) - -   PCO2ART 32.0 - 48.0 mmHg 54(H) - 35 - -   HCO3 20.0 - 28.0 mmol/L 26.3 - 29.2(H) - -   ACIDBASEDEF 0.0 - 2.0 mmol/L 1.4 - - - -   O2SAT % 99.3 - 97.7 - -       Exercise Target Goals: Exercise Program Goal: Individual exercise prescription set using results from initial 6 min walk test and THRR while considering  patient's activity barriers and  safety.   Exercise Prescription Goal: Initial exercise prescription builds to 30-45 minutes a day of aerobic activity, 2-3 days per week.  Home exercise guidelines will be given to patient during program as part of exercise prescription that the participant will acknowledge.  Activity Barriers & Risk Stratification: Activity Barriers & Cardiac Risk Stratification - 01/30/20 1423      Activity Barriers & Cardiac Risk Stratification   Activity Barriers  Muscular Weakness;Deconditioning;Shortness of Breath;Back Problems   Disc missing  bone on bone   Cardiac Risk Stratification  High       6 Minute Walk: 6 Minute Walk    Row Name 01/31/20 1448         6 Minute Walk   Phase  Initial     Distance  1280 feet     Walk Time  6 minutes     # of Rest Breaks  0     MPH  2.42     METS  3.36     RPE  9     Perceived Dyspnea   1     VO2 Peak   11.76     Symptoms  Yes (comment)     Comments  chest pain 3/10 ( has been since MI - has spoken to Dr)  f/u stress test and echo next week     Resting HR  64 bpm     Resting BP  134/84     Resting Oxygen Saturation   97 %     Exercise Oxygen Saturation  during 6 min walk  96 %     Max Ex. HR  80 bpm     Max Ex. BP  144/86     2 Minute Post BP  132/86        Oxygen Initial Assessment:   Oxygen Re-Evaluation:   Oxygen Discharge (Final Oxygen Re-Evaluation):   Initial Exercise Prescription: Initial Exercise Prescription - 01/31/20 1400      Date of Initial Exercise RX and Referring Provider   Date  01/31/20    Referring Provider  [facility]      Treadmill   MPH  2.4    Grade  1    Minutes  15    METs  3.2      Recumbant Bike   Level  1    RPM  60    Minutes  15    METs  3.2      Elliptical   Level  1    Speed  3    Minutes  15      REL-XR   Level  3    Speed  50    Minutes  15    METs  3.2      Prescription Details   Frequency (times per week)  2    Duration  Progress to 30 minutes of continuous aerobic without signs/symptoms of physical distress      Intensity   THRR 40-80% of Max Heartrate  102-140    Ratings of Perceived Exertion  11-13    Perceived Dyspnea  0-4      Resistance Training   Training Prescription  Yes    Weight   3lb    Reps  10-15       Perform Capillary Blood Glucose checks as needed.  Exercise Prescription Changes: Exercise Prescription Changes    Row Name 01/31/20 1400             Response to  Exercise   Blood Pressure (Admit)  134/84       Blood Pressure (Exercise)  144/86       Blood Pressure (Exit)  132/86       Heart Rate (Admit)  64 bpm       Heart Rate (Exercise)  80 bpm       Heart Rate (Exit)  67 bpm       Oxygen Saturation (Admit)  97 %       Oxygen Saturation (Exercise)  96 %       Rating of Perceived Exertion (Exercise)  9       Perceived Dyspnea (Exercise)  1       Symptoms  chest pain3/10        Comments  follow up stress test next week          Exercise Comments:   Exercise Goals and Review: Exercise Goals    Row Name 01/31/20 1452             Exercise Goals   Increase Physical Activity  Yes       Intervention  Provide advice, education, support and counseling about physical activity/exercise needs.;Develop an individualized exercise prescription for aerobic and resistive training based on initial evaluation findings, risk stratification, comorbidities and participant's personal goals.       Expected Outcomes  Short Term: Attend rehab on a regular basis to increase amount of physical activity.;Long Term: Add in home exercise to make exercise part of routine and to increase amount of physical activity.;Long Term: Exercising regularly at least 3-5 days a week.       Increase Strength and Stamina  Yes       Intervention  Provide advice, education, support and counseling about physical activity/exercise needs.;Develop an individualized exercise prescription for aerobic and resistive training based on initial evaluation findings, risk stratification, comorbidities and participant's personal goals.       Expected Outcomes  Short Term: Increase workloads from initial exercise prescription for resistance, speed, and METs.;Long Term: Improve cardiorespiratory fitness, muscular endurance and strength as measured by increased METs and functional capacity (6MWT)       Able to understand and use rate of perceived exertion (RPE) scale  Yes       Intervention  Provide education and explanation on how to use RPE scale       Expected Outcomes  Short Term: Able to use RPE daily in rehab to express subjective intensity level;Long Term:  Able to use RPE to guide intensity level when exercising independently       Able to understand and use Dyspnea scale  Yes       Intervention  Provide education and explanation on how to use Dyspnea scale       Expected Outcomes  Short Term: Able to use Dyspnea  scale daily in rehab to express subjective sense of shortness of breath during exertion;Long Term: Able to use Dyspnea scale to guide intensity level when exercising independently       Knowledge and understanding of Target Heart Rate Range (THRR)  Yes       Intervention  Provide education and explanation of THRR including how the numbers were predicted and where they are located for reference       Expected Outcomes  Short Term: Able to state/look up THRR;Short Term: Able to use daily as guideline for intensity in rehab;Long Term: Able to use THRR to govern intensity when exercising independently  Able to check pulse independently  Yes       Intervention  Provide education and demonstration on how to check pulse in carotid and radial arteries.;Review the importance of being able to check your own pulse for safety during independent exercise       Expected Outcomes  Short Term: Able to explain why pulse checking is important during independent exercise;Long Term: Able to check pulse independently and accurately       Understanding of Exercise Prescription  Yes       Intervention  Provide education, explanation, and written materials on patient's individual exercise prescription       Expected Outcomes  Short Term: Able to explain program exercise prescription;Long Term: Able to explain home exercise prescription to exercise independently          Exercise Goals Re-Evaluation : Exercise Goals Re-Evaluation    Row Name 02/13/20 1600             Exercise Goal Re-Evaluation   Comments  Pt has only completed orientation appointment thus far.          Discharge Exercise Prescription (Final Exercise Prescription Changes): Exercise Prescription Changes - 01/31/20 1400      Response to Exercise   Blood Pressure (Admit)  134/84    Blood Pressure (Exercise)  144/86    Blood Pressure (Exit)  132/86    Heart Rate (Admit)  64 bpm    Heart Rate (Exercise)  80 bpm    Heart Rate (Exit)  67 bpm     Oxygen Saturation (Admit)  97 %    Oxygen Saturation (Exercise)  96 %    Rating of Perceived Exertion (Exercise)  9    Perceived Dyspnea (Exercise)  1    Symptoms  chest pain3/10    Comments  follow up stress test next week       Nutrition:  Target Goals: Understanding of nutrition guidelines, daily intake of sodium '1500mg'$ , cholesterol '200mg'$ , calories 30% from fat and 7% or less from saturated fats, daily to have 5 or more servings of fruits and vegetables.  Biometrics: Pre Biometrics - 01/31/20 1453      Pre Biometrics   Height  '5\' 7"'$  (1.702 m)    Weight  166 lb 12.8 oz (75.7 kg)    BMI (Calculated)  26.12    Single Leg Stand  21.53 seconds        Nutrition Therapy Plan and Nutrition Goals: Nutrition Therapy & Goals - 02/05/20 1101      Nutrition Therapy   Diet  HH, Low Na    Fiber  30 grams    Whole Grain Foods  3 servings    Saturated Fats  12 max. grams    Fruits and Vegetables  5 servings/day    Sodium  1.5 grams      Personal Nutrition Goals   Nutrition Goal  ST: LT: build strength to go back to work (possible retirement). 6/10    Comments  Pt still smoking (1/2 pack), wants to start quitting and wants to be done by the end of the month. Using the patch and nicoteine losenges (esp. after eating). B: bacon/sausage egg and cheese (made at home). L: small sandwich (bolonga or liverweirst) with some chips D:chicken, beef (3x/week) - with fries (buys meat from [employer] - prepackaged). With stepson home eats less vegetables; pt with eat mixed vegetables or broccoli and cheese and green beans.      Intervention Plan  Intervention  Prescribe, educate and counsel regarding individualized specific dietary modifications aiming towards targeted core components such as weight, hypertension, lipid management, diabetes, heart failure and other comorbidities.;Nutrition handout(s) given to patient.    Expected Outcomes  Short Term Goal: Understand basic principles of dietary  content, such as calories, fat, sodium, cholesterol and nutrients.;Short Term Goal: A plan has been developed with personal nutrition goals set during dietitian appointment.;Long Term Goal: Adherence to prescribed nutrition plan.       Nutrition Assessments: Nutrition Assessments - 01/31/20 1452      MEDFICTS Scores   Pre Score  66       Nutrition Goals Re-Evaluation:   Nutrition Goals Discharge (Final Nutrition Goals Re-Evaluation):   Psychosocial: Target Goals: Acknowledge presence or absence of significant depression and/or stress, maximize coping skills, provide positive support system. Participant is able to verbalize types and ability to use techniques and skills needed for reducing stress and depression.   Initial Review & Psychosocial Screening: Initial Psych Review & Screening - 01/30/20 [place]?  --   Son in [place] is getting transfer to [employer]. Stepson lives next door and is at [employer] home most of day every day.      Quality of Life Scores:  Quality of Life - 01/31/20 1453      Quality of Life   Select  Quality of Life      Quality of Life Scores   Health/Function Pre  25.77 %    Socioeconomic Pre  27.56 %    Psych/Spiritual Pre  29.29 %    Family Pre  26 %    GLOBAL Pre  26.91 %      Scores of 19 and below usually indicate a poorer quality of life in these areas.  A difference of  2-3 points is a clinically meaningful difference.  A difference of 2-3 points in the total score of the Quality of Life Index has been associated with significant improvement in overall quality of life, self-image, physical symptoms, and general health in studies assessing change in quality of life.  PHQ-9: Recent Review Flowsheet Data    Depression screen [facility] 2/9 01/31/2020 01/15/2020 03/16/2018   Decreased Interest 0 0 0   Down, Depressed, Hopeless 0 0 0   PHQ - 2 Score 0 0 0   Altered sleeping 3 - -   Tired, decreased energy 3 - -    Change in appetite 0 - -   Feeling bad or failure about yourself  0 - -   Trouble concentrating 0 - -   Suicidal thoughts 0 - -   PHQ-9 Score 6 - -   Difficult doing work/chores Somewhat difficult - -     Interpretation of Total Score  Total Score Depression Severity:  1-4 = Minimal depression, 5-9 = Mild depression, 10-14 = Moderate depression, 15-19 = Moderately severe depression, 20-27 = Severe depression   Psychosocial Evaluation and Intervention: Psychosocial Evaluation - 01/30/20 1441      Psychosocial Evaluation & Interventions   Interventions  Encouraged to exercise with the program and follow exercise prescription;Relaxation education    Comments  Amad should do well with the program. He has no barriers and has a good support system.  He is wanting to QUIT smoking and has tried several times.  Hopefully we will be able to help him be successful with his desire to quit. His wife passed away last fall.  He does have a puuppy living with him.    Expected Outcomes  STG: Tahji is able to attend program routinely. LTG [place] will discharge with the tools and resources to help him fight his heart disease    Continue Psychosocial Services   Follow up required by staff       Psychosocial Re-Evaluation:   Psychosocial Discharge (Final Psychosocial Re-Evaluation):   Vocational Rehabilitation: Provide vocational rehab assistance to qualifying candidates.   Vocational Rehab Evaluation & Intervention: Vocational Rehab - 01/30/20 1427      Initial Vocational Rehab Evaluation & Intervention   Assessment shows need for Vocational Rehabilitation  No       Education: Education Goals: Education classes will be provided on a variety of topics geared toward better understanding of heart health and risk factor modification. Participant will state understanding/return demonstration of topics presented as noted by education test scores.  Learning Barriers/Preferences: Learning  Barriers/Preferences - 01/30/20 1427      Learning Barriers/Preferences   Learning Barriers  None    Learning Preferences  None       Education Topics:  AED/CPR: - Group verbal and written instruction with the use of models to demonstrate the basic use of the AED with the basic ABC's of resuscitation.   General Nutrition Guidelines/Fats and Fiber: -Group instruction provided by verbal, written material, models and posters to present the general guidelines for heart healthy nutrition. Gives an explanation and review of dietary fats and fiber.   Controlling Sodium/Reading Food Labels: -Group verbal and written material supporting the discussion of sodium use in heart healthy nutrition. Review and explanation with models, verbal and written materials for utilization of the food label.   Exercise Physiology & General Exercise Guidelines: - Group verbal and written instruction with models to review the exercise physiology of the cardiovascular system and associated critical values. Provides general exercise guidelines with specific guidelines to those with heart or lung disease.    Aerobic Exercise & Resistance Training: - Gives group verbal and written instruction on the various components of exercise. Focuses on aerobic and resistive training programs and the benefits of this training and how to safely progress through these programs..   Flexibility, Balance, Mind/Body Relaxation: Provides group verbal/written instruction on the benefits of flexibility and balance training, including mind/body exercise modes such as yoga, pilates and tai chi.  Demonstration and skill practice provided.   Stress and Anxiety: - Provides group verbal and written instruction about the health risks of elevated stress and causes of high stress.  Discuss the correlation between heart/lung disease and anxiety and treatment options. Review healthy ways to manage with stress and anxiety.   Depression: -  Provides group verbal and written instruction on the correlation between heart/lung disease and depressed mood, treatment options, and the stigmas associated with seeking treatment.   Anatomy & Physiology of the Heart: - Group verbal and written instruction and models provide basic cardiac anatomy and physiology, with the coronary electrical and arterial systems. Review of Valvular disease and Heart Failure   Cardiac Procedures: - Group verbal and written instruction to review commonly prescribed medications for heart disease. Reviews the medication, class of the drug, and side effects. Includes the steps to properly store meds and maintain the prescription regimen. (beta blockers and nitrates)   Cardiac Medications I: - Group verbal and written instruction to review commonly prescribed medications for heart disease. Reviews the medication, class of the drug, and side effects. Includes the steps to properly store meds  and maintain the prescription regimen.   Cardiac Medications II: -Group verbal and written instruction to review commonly prescribed medications for heart disease. Reviews the medication, class of the drug, and side effects. (all other drug classes)    Go Sex-Intimacy & Heart Disease, Get SMART - Goal Setting: - Group verbal and written instruction through game format to discuss heart disease and the return to sexual intimacy. Provides group verbal and written material to discuss and apply goal setting through the application of the S.M.A.R.T. Method.   Other Matters of the Heart: - Provides group verbal, written materials and models to describe Stable Angina and Peripheral Artery. Includes description of the disease process and treatment options available to the cardiac patient.   Exercise & Equipment Safety: - Individual verbal instruction and demonstration of equipment use and safety with use of the equipment.   Cardiac Rehab from 01/31/2020 in [facility] Cardiac and Pulmonary  Rehab  Date  01/31/20  Educator  AS  Instruction Review Code  1- Verbalizes Understanding      Infection Prevention: - Provides verbal and written material to individual with discussion of infection control including proper hand washing and proper equipment cleaning during exercise session.   Cardiac Rehab from 01/31/2020 in [facility] Cardiac and Pulmonary Rehab  Date  01/31/20  Educator  AS  Instruction Review Code  1- Verbalizes Understanding      Falls Prevention: - Provides verbal and written material to individual with discussion of falls prevention and safety.   Cardiac Rehab from 01/31/2020 in [facility] Cardiac and Pulmonary Rehab  Date  01/31/20  Educator  AS  Instruction Review Code  1- Verbalizes Understanding      Diabetes: - Individual verbal and written instruction to review signs/symptoms of diabetes, desired ranges of glucose level fasting, after meals and with exercise. Acknowledge that pre and post exercise glucose checks will be done for 3 sessions at entry of program.   Know Your Numbers and Risk Factors: -Group verbal and written instruction about important numbers in your health.  Discussion of what are risk factors and how they play a role in the disease process.  Review of Cholesterol, Blood Pressure, Diabetes, and BMI and the role they play in your overall health.   Sleep Hygiene: -Provides group verbal and written instruction about how sleep can affect your health.  Define sleep hygiene, discuss sleep cycles and impact of sleep habits. Review good sleep hygiene tips.    Other: -Provides group and verbal instruction on various topics (see comments)   Knowledge Questionnaire Score: Knowledge Questionnaire Score - 01/31/20 1454      Knowledge Questionnaire Score   Pre Score  23/26       Core Components/Risk Factors/Patient Goals at Admission: Personal Goals and Risk Factors at Admission - 01/31/20 1454      Core Components/Risk Factors/Patient Goals on  Admission    Weight Management  Yes;Weight Maintenance    Admit Weight  166 lb 12.8 oz (75.7 kg)    Goal Weight: Short Term  165 lb (74.8 kg)    Goal Weight: Long Term  165 lb (74.8 kg)    Expected Outcomes  Short Term: Continue to assess and modify interventions until short term weight is achieved;Weight Maintenance: Understanding of the daily nutrition guidelines, which includes 25-35% calories from fat, 7% or less cal from saturated fats, less than '200mg'$  cholesterol, less than 1.5gm of sodium, & 5 or more servings of fruits and vegetables daily;Long Term: Adherence to nutrition and physical  activity/exercise program aimed toward attainment of established weight goal    Intervention  Assist the participant in steps to quit. Provide individualized education and counseling about committing to Tobacco Cessation, relapse prevention, and pharmacological support that can be provided by physician.;Advice worker, assist with locating and accessing local/national Quit Smoking programs, and support quit date choice.    Expected Outcomes  Short Term: Will demonstrate readiness to quit, by selecting a quit date.;Short Term: Will quit all tobacco product use, adhering to prevention of relapse plan.;Long Term: Complete abstinence from all tobacco products for at least 12 months from quit date.    Intervention  Provide education on lifestyle modifcations including regular physical activity/exercise, weight management, moderate sodium restriction and increased consumption of fresh fruit, vegetables, and low fat dairy, alcohol moderation, and smoking cessation.;Monitor prescription use compliance.    Expected Outcomes  Short Term: Continued assessment and intervention until BP is < 140/34m HG in hypertensive participants. < 130/88mHG in hypertensive participants with diabetes, heart failure or chronic kidney disease.;Long Term: Maintenance of blood pressure at goal levels.    Intervention  Provide  education and support for participant on nutrition & aerobic/resistive exercise along with prescribed medications to achieve LDL '70mg'$ , HDL >'40mg'$ .    Expected Outcomes  Short Term: Participant states understanding of desired cholesterol values and is compliant with medications prescribed. Participant is following exercise prescription and nutrition guidelines.;Long Term: Cholesterol controlled with medications as prescribed, with individualized exercise RX and with personalized nutrition plan. Value goals: LDL < '70mg'$ , HDL > 40 mg.       Core Components/Risk Factors/Patient Goals Review:    Core Components/Risk Factors/Patient Goals at Discharge (Final Review):    ITP Comments: ITP Comments    Row Name 01/30/20 1439 02/09/20 1547 02/21/20 1027       ITP Comments  Virtual Orientation completed today Has appointment for EP Eval and Gym Orientation on 3/3. [facility]  Completed Initial RD Eval  30 day chart review completed. ITP sent to Dr M Zachery Dakinsedical Director, for review,changes as needed and signature.  New to program        Comments:

## 2020-02-28 ENCOUNTER — Telehealth: Payer: Self-pay | Admitting: *Deleted

## 2020-02-28 ENCOUNTER — Encounter: Payer: Self-pay | Admitting: *Deleted

## 2020-02-28 DIAGNOSIS — I213 ST elevation (STEMI) myocardial infarction of unspecified site: Secondary | ICD-10-CM

## 2020-02-28 NOTE — Telephone Encounter (Signed)
Called to check on pt.  Unable to leave message on voicemail for mobile number. Tried home phone, left message.

## 2020-03-05 ENCOUNTER — Encounter: Payer: 59 | Attending: Internal Medicine

## 2020-03-05 ENCOUNTER — Telehealth: Payer: Self-pay

## 2020-03-05 DIAGNOSIS — Z7982 Long term (current) use of aspirin: Secondary | ICD-10-CM | POA: Insufficient documentation

## 2020-03-05 DIAGNOSIS — I10 Essential (primary) hypertension: Secondary | ICD-10-CM | POA: Insufficient documentation

## 2020-03-05 DIAGNOSIS — Z7902 Long term (current) use of antithrombotics/antiplatelets: Secondary | ICD-10-CM | POA: Insufficient documentation

## 2020-03-05 DIAGNOSIS — F17210 Nicotine dependence, cigarettes, uncomplicated: Secondary | ICD-10-CM | POA: Insufficient documentation

## 2020-03-05 DIAGNOSIS — J449 Chronic obstructive pulmonary disease, unspecified: Secondary | ICD-10-CM | POA: Insufficient documentation

## 2020-03-05 DIAGNOSIS — I213 ST elevation (STEMI) myocardial infarction of unspecified site: Secondary | ICD-10-CM | POA: Insufficient documentation

## 2020-03-05 DIAGNOSIS — Z79899 Other long term (current) drug therapy: Secondary | ICD-10-CM | POA: Insufficient documentation

## 2020-03-05 NOTE — Telephone Encounter (Signed)
Called pt to check in as missed his planned first day of rehab 4/6 @ 1115.

## 2020-03-08 ENCOUNTER — Telehealth: Payer: Self-pay

## 2020-03-08 NOTE — Telephone Encounter (Signed)
Called pt as he was supposed to start this week 4/5; pt original start day the week of 3/15. LM

## 2020-03-12 ENCOUNTER — Encounter: Payer: Self-pay | Admitting: *Deleted

## 2020-03-12 ENCOUNTER — Encounter: Payer: 59 | Admitting: *Deleted

## 2020-03-12 ENCOUNTER — Other Ambulatory Visit: Payer: Self-pay

## 2020-03-12 DIAGNOSIS — J449 Chronic obstructive pulmonary disease, unspecified: Secondary | ICD-10-CM | POA: Diagnosis not present

## 2020-03-12 DIAGNOSIS — I213 ST elevation (STEMI) myocardial infarction of unspecified site: Secondary | ICD-10-CM | POA: Diagnosis present

## 2020-03-12 DIAGNOSIS — I10 Essential (primary) hypertension: Secondary | ICD-10-CM | POA: Diagnosis not present

## 2020-03-12 DIAGNOSIS — Z7982 Long term (current) use of aspirin: Secondary | ICD-10-CM | POA: Diagnosis not present

## 2020-03-12 DIAGNOSIS — Z7902 Long term (current) use of antithrombotics/antiplatelets: Secondary | ICD-10-CM | POA: Diagnosis not present

## 2020-03-12 DIAGNOSIS — Z79899 Other long term (current) drug therapy: Secondary | ICD-10-CM | POA: Diagnosis not present

## 2020-03-12 DIAGNOSIS — F17210 Nicotine dependence, cigarettes, uncomplicated: Secondary | ICD-10-CM | POA: Diagnosis not present

## 2020-03-12 NOTE — Progress Notes (Signed)
Daily Session Note  Patient Details  Name: Kurt [name] MRN: [id number] Date of Birth: 05-03-1956 Referring Provider:     Cardiac Rehab from 01/31/2020 in [facility] Cardiac [facility]  Referring Provider  [facility]      Encounter Date: 03/12/2020  Check In: Session Check In - 03/12/20 1222      Check-In   Supervising physician immediately available to respond to emergencies  See telemetry face sheet for immediately available ER MD    Location  [facility]-Cardiac & Pulmonary Rehab    Staff Present  Heath Lark, RN, BSN, CCRP;Amanda Sommer, BA, ACSM CEP, Exercise Physiologist;Joseph Hood RCP,RRT,BSRT    Virtual Visit  No    Medication changes reported      Yes    Comments  mexoxicam for 2 weeks    Fall or balance concerns reported     No    Current number of cigarettes/nicotine per day      18    Warm-up and Cool-down  Performed on first and last piece of equipment    Resistance Training Performed  Yes    VAD Patient?  No    PAD/SET Patient?  No      Pain Assessment   Currently in Pain?  No/denies          Social History   Tobacco Use  Smoking Status Current Every Day Smoker  . Packs/day: 0.50  . Years: 45.00  . Pack years: 22.50  Smokeless Tobacco Never Used  Tobacco Comment   Using the patch several days a week    Goals Met:  Exercise tolerated well Personal goals reviewed Strength training completed today  Goals Unmet:  Not Applicable  Comments: First full day of exercise!  Patient was oriented to gym and equipment including functions, settings, policies, and procedures.  Patient's individual exercise prescription and treatment plan were reviewed.  All starting workloads were established based on the results of the 6 minute walk test done at initial orientation visit.  The plan for exercise progression was also introduced and progression will be customized based on patient's performance and goals.    Dr. Emily Filbert is Medical Director for [place] [facility].

## 2020-03-14 ENCOUNTER — Other Ambulatory Visit: Payer: Self-pay

## 2020-03-14 ENCOUNTER — Encounter: Payer: 59 | Admitting: *Deleted

## 2020-03-14 DIAGNOSIS — I213 ST elevation (STEMI) myocardial infarction of unspecified site: Secondary | ICD-10-CM

## 2020-03-14 NOTE — Progress Notes (Signed)
Daily Session Note  Patient Details  Name: Kurt [name] MRN: [id number] Date of Birth: [birth year] Referring Provider:     Cardiac Rehab from 01/31/2020 in [facility]  Referring Provider  [facility]      Encounter Date: 03/14/2020  Check In: Session Check In - 03/14/20 1116      Check-In   Supervising physician immediately available to respond to emergencies  See telemetry face sheet for immediately available ER MD    Location  [facility]-Cardiac & Pulmonary Rehab    Staff Present  Renita Papa, RN BSN;Joseph Hood RCP,RRT,BSRT;Laureen Owens Shark, BS, RRT, CPFT;Amanda Oletta Darter, [place], ACSM CEP, Exercise Physiologist    Virtual Visit  No    Medication changes reported      No    Fall or balance concerns reported     No    Warm-up and Cool-down  Performed on first and last piece of equipment    Resistance Training Performed  Yes    VAD Patient?  No    PAD/SET Patient?  No      Pain Assessment   Currently in Pain?  No/denies          Social History   Tobacco Use  Smoking Status Current Every Day Smoker  . Packs/day: 0.50  . Years: 45.00  . Pack years: 22.50  Smokeless Tobacco Never Used  Tobacco Comment   PLAns Quit date of May 1  still smoking almost a pack a day 4/13    Goals Met:  Independence with exercise equipment Exercise tolerated well No report of cardiac concerns or symptoms Strength training completed today  Goals Unmet:  Not Applicable  Comments: Pt able to follow exercise prescription today without complaint.  Will continue to monitor for progression.    Dr. Emily Filbert is Medical Director for [place] [facility].

## 2020-03-20 ENCOUNTER — Encounter: Payer: Self-pay | Admitting: *Deleted

## 2020-03-20 DIAGNOSIS — I213 ST elevation (STEMI) myocardial infarction of unspecified site: Secondary | ICD-10-CM

## 2020-03-20 NOTE — Progress Notes (Signed)
Cardiac Individual Treatment Plan  Patient Details  Name: Kurt [name] MRN: [id number] Date of Birth: [birth year] Referring Provider:     Cardiac Rehab from 01/31/2020 in [facility] Cardiac and Pulmonary Rehab  Referring Provider  [facility]      Initial Encounter Date:    Cardiac Rehab from 01/31/2020 in [facility] Cardiac and Pulmonary Rehab  Date  01/31/20      Visit Diagnosis: ST elevation myocardial infarction (STEMI), unspecified artery ([place])  Patient's Home Medications on Admission:  Current Outpatient Medications:  .  aspirin 81 MG tablet, Take 81 mg by mouth daily., Disp: , Rfl:  .  folic acid (FOLVITE) 1 MG tablet, Take 1 tablet (1 mg total) by mouth daily., Disp: 90 tablet, Rfl: 3 .  lisinopril (ZESTRIL) 5 MG tablet, Take 1 tablet (5 mg total) by mouth daily., Disp: 90 tablet, Rfl: 3 .  magnesium oxide (MAG-OX) 400 (241.3 Mg) MG tablet, Take 1 tablet (400 mg total) by mouth daily. (Patient not taking: Reported on 01/30/2020), Disp: 30 tablet, Rfl: 0 .  metoprolol tartrate (LOPRESSOR) 50 MG tablet, Take 1 tablet (50 mg total) by mouth 2 (two) times daily., Disp: 90 tablet, Rfl: 3 .  Multiple Vitamin (MULTIVITAMIN WITH MINERALS) TABS tablet, Take 1 tablet by mouth daily., Disp: 30 tablet, Rfl: 0 .  nicotine (NICODERM CQ - DOSED IN MG/24 HOURS) 21 mg/24hr patch, Place 1 patch (21 mg total) onto the skin daily., Disp: 14 patch, Rfl: 0 .  rosuvastatin (CRESTOR) 40 MG tablet, Take 1 tablet (40 mg total) by mouth daily at 6 PM., Disp: 90 tablet, Rfl: 3 .  thiamine 100 MG tablet, Take 1 tablet (100 mg total) by mouth daily., Disp: 30 tablet, Rfl: 0 .  ticagrelor (BRILINTA) 90 MG TABS tablet, Take 1 tablet (90 mg total) by mouth 2 (two) times daily., Disp: 60 tablet, Rfl: 0 .  traMADol (ULTRAM) 50 MG tablet, Take by mouth every 8 (eight) hours as needed., Disp: , Rfl:   Past Medical History: Past Medical History:  Diagnosis Date  . COPD (chronic obstructive pulmonary disease) ([place])   . ED  (erectile dysfunction)   . Hypertension   . Lumbar disc disease     Tobacco Use: Social History   Tobacco Use  Smoking Status Current Every Day Smoker  . Packs/day: 0.50  . Years: 45.00  . Pack years: 22.50  Smokeless Tobacco Never Used  Tobacco Comment   PLAns Quit date of May 1  still smoking almost a pack a day 4/13    Labs: Recent Review Flowsheet Data    Labs for ITP Cardiac and Pulmonary Rehab Latest Ref Rng & Units 11/24/2019 11/26/2019 11/27/2019 11/30/2019 01/15/2020   Cholestrol 0 - 200 mg/dL - - - - 115   LDLCALC 0 - 99 mg/dL - - - - 59   HDL >39.00 mg/dL - - - - 36.80(L)   Trlycerides 0.0 - 149.0 mg/dL - 227(H) - 100 95.0   Hemoglobin A1c 4.8 - 5.6 % - - - - -   PHART 7.350 - 7.450 7.29(L) - 7.53(H) - -   PCO2ART 32.0 - 48.0 mmHg 54(H) - 35 - -   HCO3 20.0 - 28.0 mmol/L 26.3 - 29.2(H) - -   ACIDBASEDEF 0.0 - 2.0 mmol/L 1.4 - - - -   O2SAT % 99.3 - 97.7 - -       Exercise Target Goals: Exercise Program Goal: Individual exercise prescription set using results from initial 6 min walk test and  THRR while considering  patient's activity barriers and safety.   Exercise Prescription Goal: Initial exercise prescription builds to 30-45 minutes a day of aerobic activity, 2-3 days per week.  Home exercise guidelines will be given to patient during program as part of exercise prescription that the participant will acknowledge.   Education: Aerobic Exercise & Resistance Training: - Gives group verbal and written instruction on the various components of exercise. Focuses on aerobic and resistive training programs and the benefits of this training and how to safely progress through these programs..   Education: Exercise & Equipment Safety: - Individual verbal instruction and demonstration of equipment use and safety with use of the equipment.   Cardiac Rehab from 01/31/2020 in [facility] Cardiac and Pulmonary Rehab  Date  01/31/20  Educator  AS  Instruction Review Code  1-  Verbalizes Understanding      Education: Exercise Physiology & General Exercise Guidelines: - Group verbal and written instruction with models to review the exercise physiology of the cardiovascular system and associated critical values. Provides general exercise guidelines with specific guidelines to those with heart or lung disease.    Education: Flexibility, Balance, Mind/Body Relaxation: Provides group verbal/written instruction on the benefits of flexibility and balance training, including mind/body exercise modes such as yoga, pilates and tai chi.  Demonstration and skill practice provided.   Activity Barriers & Risk Stratification: Activity Barriers & Cardiac Risk Stratification - 01/30/20 1423      Activity Barriers & Cardiac Risk Stratification   Activity Barriers  Muscular Weakness;Deconditioning;Shortness of Breath;Back Problems   Disc missing  bone on bone   Cardiac Risk Stratification  High       6 Minute Walk: 6 Minute Walk    Row Name 01/31/20 1448         6 Minute Walk   Phase  Initial     Distance  1280 feet     Walk Time  6 minutes     # of Rest Breaks  0     MPH  2.42     METS  3.36     RPE  9     Perceived Dyspnea   1     VO2 Peak  11.76     Symptoms  Yes (comment)     Comments  chest pain 3/10 ( has been since MI - has spoken to Dr)  f/u stress test and echo next week     Resting HR  64 bpm     Resting BP  134/84     Resting Oxygen Saturation   97 %     Exercise Oxygen Saturation  during 6 min walk  96 %     Max Ex. HR  80 bpm     Max Ex. BP  144/86     2 Minute Post BP  132/86        Oxygen Initial Assessment:   Oxygen Re-Evaluation:   Oxygen Discharge (Final Oxygen Re-Evaluation):   Initial Exercise Prescription: Initial Exercise Prescription - 01/31/20 1400      Date of Initial Exercise RX and Referring Provider   Date  01/31/20    Referring Provider  [facility]      Treadmill   MPH  2.4    Grade  1    Minutes  15    METs   3.2      Recumbant Bike   Level  1    RPM  60    Minutes  15  METs  3.2      Elliptical   Level  1    Speed  3    Minutes  15      REL-XR   Level  3    Speed  50    Minutes  15    METs  3.2      Prescription Details   Frequency (times per week)  2    Duration  Progress to 30 minutes of continuous aerobic without signs/symptoms of physical distress      Intensity   THRR 40-80% of Max Heartrate  102-140    Ratings of Perceived Exertion  11-13    Perceived Dyspnea  0-4      Resistance Training   Training Prescription  Yes    Weight   3lb    Reps  10-15       Perform Capillary Blood Glucose checks as needed.  Exercise Prescription Changes: Exercise Prescription Changes    Row Name 01/31/20 1400 03/14/20 1200           Response to Exercise   Blood Pressure (Admit)  134/84  134/78      Blood Pressure (Exercise)  144/86  154/86      Blood Pressure (Exit)  132/86  134/76      Heart Rate (Admit)  64 bpm  61 bpm      Heart Rate (Exercise)  80 bpm  88 bpm      Heart Rate (Exit)  67 bpm  67 bpm      Oxygen Saturation (Admit)  97 %  --      Oxygen Saturation (Exercise)  96 %  --      Rating of Perceived Exertion (Exercise)  9  13      Perceived Dyspnea (Exercise)  1  --      Symptoms  chest pain3/10  none      Comments  follow up stress test next week  first full day of exercise      Duration  --  Progress to 30 minutes of  aerobic without signs/symptoms of physical distress      Intensity  --  THRR unchanged        Progression   Progression  --  Continue to progress workloads to maintain intensity without signs/symptoms of physical distress.      Average METs  --  2.05        Resistance Training   Training Prescription  --  Yes      Weight  --   3lb      Reps  --  10-15        Interval Training   Interval Training  --  No        Recumbant Bike   Level  --  1      Minutes  --  15      METs  --  2        NuStep   Level  --  1      Minutes  --  15       METs  --  2.1         Exercise Comments: Exercise Comments    Row Name 03/12/20 1224           Exercise Comments  First full day of exercise!  Patient was oriented to gym and equipment including functions, settings, policies, and procedures.  Patient's individual exercise prescription and treatment plan were reviewed.  All starting workloads were established based on the results of the 6 minute walk test done at initial orientation visit.  The plan for exercise progression was also introduced and progression will be customized based on patient's performance and goals.          Exercise Goals and Review: Exercise Goals    Row Name 01/31/20 1452             Exercise Goals   Increase Physical Activity  Yes       Intervention  Provide advice, education, support and counseling about physical activity/exercise needs.;Develop an individualized exercise prescription for aerobic and resistive training based on initial evaluation findings, risk stratification, comorbidities and participant's personal goals.       Expected Outcomes  Short Term: Attend rehab on a regular basis to increase amount of physical activity.;Long Term: Add in home exercise to make exercise part of routine and to increase amount of physical activity.;Long Term: Exercising regularly at least 3-5 days a week.       Increase Strength and Stamina  Yes       Intervention  Provide advice, education, support and counseling about physical activity/exercise needs.;Develop an individualized exercise prescription for aerobic and resistive training based on initial evaluation findings, risk stratification, comorbidities and participant's personal goals.       Expected Outcomes  Short Term: Increase workloads from initial exercise prescription for resistance, speed, and METs.;Long Term: Improve cardiorespiratory fitness, muscular endurance and strength as measured by increased METs and functional capacity (6MWT)       Able to  understand and use rate of perceived exertion (RPE) scale  Yes       Intervention  Provide education and explanation on how to use RPE scale       Expected Outcomes  Short Term: Able to use RPE daily in rehab to express subjective intensity level;Long Term:  Able to use RPE to guide intensity level when exercising independently       Able to understand and use Dyspnea scale  Yes       Intervention  Provide education and explanation on how to use Dyspnea scale       Expected Outcomes  Short Term: Able to use Dyspnea scale daily in rehab to express subjective sense of shortness of breath during exertion;Long Term: Able to use Dyspnea scale to guide intensity level when exercising independently       Knowledge and understanding of Target Heart Rate Range (THRR)  Yes       Intervention  Provide education and explanation of THRR including how the numbers were predicted and where they are located for reference       Expected Outcomes  Short Term: Able to state/look up THRR;Short Term: Able to use daily as guideline for intensity in rehab;Long Term: Able to use THRR to govern intensity when exercising independently       Able to check pulse independently  Yes       Intervention  Provide education and demonstration on how to check pulse in carotid and radial arteries.;Review the importance of being able to check your own pulse for safety during independent exercise       Expected Outcomes  Short Term: Able to explain why pulse checking is important during independent exercise;Long Term: Able to check pulse independently and accurately       Understanding of Exercise Prescription  Yes       Intervention  Provide education, explanation, and written materials on  patient's individual exercise prescription       Expected Outcomes  Short Term: Able to explain program exercise prescription;Long Term: Able to explain home exercise prescription to exercise independently          Exercise Goals Re-Evaluation  : Exercise Goals Re-Evaluation    Row Name 02/13/20 1600 02/28/20 1425 03/12/20 1224         Exercise Goal Re-Evaluation   Exercise Goals Review  --  --  Able to understand and use rate of perceived exertion (RPE) scale;Knowledge and understanding of Target Heart Rate Range (THRR);Able to check pulse independently;Understanding of Exercise Prescription     Comments  Pt has only completed orientation appointment thus far.  Pt has only completed orientation appointment thus far.  Reviewed RPE scale, THR and program prescription with pt today.  Pt voiced understanding and was given a copy of goals to take home.     Expected Outcomes  --  --  Short: Use RPE daily to regulate intensity. Long: Follow program prescription in THR.        Discharge Exercise Prescription (Final Exercise Prescription Changes): Exercise Prescription Changes - 03/14/20 1200      Response to Exercise   Blood Pressure (Admit)  134/78    Blood Pressure (Exercise)  154/86    Blood Pressure (Exit)  134/76    Heart Rate (Admit)  61 bpm    Heart Rate (Exercise)  88 bpm    Heart Rate (Exit)  67 bpm    Rating of Perceived Exertion (Exercise)  13    Symptoms  none    Comments  first full day of exercise    Duration  Progress to 30 minutes of  aerobic without signs/symptoms of physical distress    Intensity  THRR unchanged      Progression   Progression  Continue to progress workloads to maintain intensity without signs/symptoms of physical distress.    Average METs  2.05      Resistance Training   Training Prescription  Yes    Weight   3lb    Reps  10-15      Interval Training   Interval Training  No      Recumbant Bike   Level  1    Minutes  15    METs  2      NuStep   Level  1    Minutes  15    METs  2.1       Nutrition:  Target Goals: Understanding of nutrition guidelines, daily intake of sodium '1500mg'$ , cholesterol '200mg'$ , calories 30% from fat and 7% or less from saturated fats, daily to have 5 or  more servings of fruits and vegetables.  Education: Controlling Sodium/Reading Food Labels -Group verbal and written material supporting the discussion of sodium use in heart healthy nutrition. Review and explanation with models, verbal and written materials for utilization of the food label.   Education: General Nutrition Guidelines/Fats and Fiber: -Group instruction provided by verbal, written material, models and posters to present the general guidelines for heart healthy nutrition. Gives an explanation and review of dietary fats and fiber.   Biometrics: Pre Biometrics - 01/31/20 1453      Pre Biometrics   Height  '5\' 7"'$  (1.702 m)    Weight  166 lb 12.8 oz (75.7 kg)    BMI (Calculated)  26.12    Single Leg Stand  21.53 seconds        Nutrition Therapy Plan and Nutrition  Goals: Nutrition Therapy & Goals - 02/05/20 1101      Nutrition Therapy   Diet  HH, Low Na    Fiber  30 grams    Whole Grain Foods  3 servings    Saturated Fats  12 max. grams    Fruits and Vegetables  5 servings/day    Sodium  1.5 grams      Personal Nutrition Goals   Nutrition Goal  ST: LT: build strength to go back to work (possible retirement). 6/10    Comments  Pt still smoking (1/2 pack), wants to start quitting and wants to be done by the end of the month. Using the patch and nicoteine losenges (esp. after eating). B: bacon/sausage egg and cheese (made at home). L: small sandwich (bolonga or liverweirst) with some chips D:chicken, beef (3x/week) - with fries (buys meat from [employer] - prepackaged). With stepson home eats less vegetables; pt with eat mixed vegetables or broccoli and cheese and green beans.      Intervention Plan   Intervention  Prescribe, educate and counsel regarding individualized specific dietary modifications aiming towards targeted core components such as weight, hypertension, lipid management, diabetes, heart failure and other comorbidities.;Nutrition handout(s) given to patient.     Expected Outcomes  Short Term Goal: Understand basic principles of dietary content, such as calories, fat, sodium, cholesterol and nutrients.;Short Term Goal: A plan has been developed with personal nutrition goals set during dietitian appointment.;Long Term Goal: Adherence to prescribed nutrition plan.       Nutrition Assessments: Nutrition Assessments - 01/31/20 1452      MEDFICTS Scores   Pre Score  66       MEDIFICTS Score Key:          ?70 Need to make dietary changes          40-70 Heart Healthy Diet         ? 40 Therapeutic Level Cholesterol Diet  Nutrition Goals Re-Evaluation:   Nutrition Goals Discharge (Final Nutrition Goals Re-Evaluation):   Psychosocial: Target Goals: Acknowledge presence or absence of significant depression and/or stress, maximize coping skills, provide positive support system. Participant is able to verbalize types and ability to use techniques and skills needed for reducing stress and depression.   Education: Depression - Provides group verbal and written instruction on the correlation between heart/lung disease and depressed mood, treatment options, and the stigmas associated with seeking treatment.   Education: Sleep Hygiene -Provides group verbal and written instruction about how sleep can affect your health.  Define sleep hygiene, discuss sleep cycles and impact of sleep habits. Review good sleep hygiene tips.     Education: Stress and Anxiety: - Provides group verbal and written instruction about the health risks of elevated stress and causes of high stress.  Discuss the correlation between heart/lung disease and anxiety and treatment options. Review healthy ways to manage with stress and anxiety.    Initial Review & Psychosocial Screening: Initial Psych Review & Screening - 01/30/20 [place]?  --   Son in [place] is getting transfer to [employer]. Stepson lives next door and is at [employer] home  most of day every day.      Quality of Life Scores:  Quality of Life - 01/31/20 1453      Quality of Life   Select  Quality of Life      Quality of Life Scores   Health/Function Pre  25.77 %    Socioeconomic Pre  27.56 %    Psych/Spiritual Pre  29.29 %    Family Pre  26 %    GLOBAL Pre  26.91 %      Scores of 19 and below usually indicate a poorer quality of life in these areas.  A difference of  2-3 points is a clinically meaningful difference.  A difference of 2-3 points in the total score of the Quality of Life Index has been associated with significant improvement in overall quality of life, self-image, physical symptoms, and general health in studies assessing change in quality of life.  PHQ-9: Recent Review Flowsheet Data    Depression screen [facility] 2/9 03/14/2020 01/31/2020 01/15/2020 03/16/2018   Decreased Interest 0 0 0 0   Down, Depressed, Hopeless 0 0 0 0   PHQ - 2 Score 0 0 0 0   Altered sleeping 3 3 - -   Tired, decreased energy 1 3 - -   Change in appetite 0 0 - -   Feeling bad or failure about yourself  0 0 - -   Trouble concentrating 0 0 - -   Moving slowly or fidgety/restless 0 - - -   Suicidal thoughts 0 0 - -   PHQ-9 Score 4 6 - -   Difficult doing work/chores Somewhat difficult Somewhat difficult - -     Interpretation of Total Score  Total Score Depression Severity:  1-4 = Minimal depression, 5-9 = Mild depression, 10-14 = Moderate depression, 15-19 = Moderately severe depression, 20-27 = Severe depression   Psychosocial Evaluation and Intervention: Psychosocial Evaluation - 01/30/20 1441      Psychosocial Evaluation & Interventions   Interventions  Encouraged to exercise with the program and follow exercise prescription;Relaxation education    Comments  Edras should do well with the program. He has no barriers and has a good support system.  He is wanting to QUIT smoking and has tried several times.  Hopefully we will be able to help him be successful with  his desire to quit. His wife passed away last fall.  He does have a puuppy living with him.    Expected Outcomes  STG: Acheron is able to attend program routinely. LTG Jakhari will discharge with the tools and resources to help him fight his heart disease    Continue Psychosocial Services   Follow up required by staff       Psychosocial Re-Evaluation:   Psychosocial Discharge (Final Psychosocial Re-Evaluation):   Vocational Rehabilitation: Provide vocational rehab assistance to qualifying candidates.   Vocational Rehab Evaluation & Intervention: Vocational Rehab - 01/30/20 1427      Initial Vocational Rehab Evaluation & Intervention   Assessment shows need for Vocational Rehabilitation  No       Education: Education Goals: Education classes will be provided on a variety of topics geared toward better understanding of heart health and risk factor modification. Participant will state understanding/return demonstration of topics presented as noted by education test scores.  Learning Barriers/Preferences: Learning Barriers/Preferences - 01/30/20 1427      Learning Barriers/Preferences   Learning Barriers  None    Learning Preferences  None       General Cardiac Education Topics:  AED/CPR: - Group verbal and written instruction with the use of models to demonstrate the basic use of the AED with the basic ABC's of resuscitation.   Anatomy & Physiology of the Heart: - Group verbal and written instruction and models provide basic  cardiac anatomy and physiology, with the coronary electrical and arterial systems. Review of Valvular disease and Heart Failure   Cardiac Procedures: - Group verbal and written instruction to review commonly prescribed medications for heart disease. Reviews the medication, class of the drug, and side effects. Includes the steps to properly store meds and maintain the prescription regimen. (beta blockers and nitrates)   Cardiac Medications I: - Group  verbal and written instruction to review commonly prescribed medications for heart disease. Reviews the medication, class of the drug, and side effects. Includes the steps to properly store meds and maintain the prescription regimen.   Cardiac Medications II: -Group verbal and written instruction to review commonly prescribed medications for heart disease. Reviews the medication, class of the drug, and side effects. (all other drug classes)    Go Sex-Intimacy & Heart Disease, Get SMART - Goal Setting: - Group verbal and written instruction through game format to discuss heart disease and the return to sexual intimacy. Provides group verbal and written material to discuss and apply goal setting through the application of the S.M.A.R.T. Method.   Other Matters of the Heart: - Provides group verbal, written materials and models to describe Stable Angina and Peripheral Artery. Includes description of the disease process and treatment options available to the cardiac patient.   Infection Prevention: - Provides verbal and written material to individual with discussion of infection control including proper hand washing and proper equipment cleaning during exercise session.   Cardiac Rehab from 01/31/2020 in [facility] Cardiac and Pulmonary Rehab  Date  01/31/20  Educator  AS  Instruction Review Code  1- Verbalizes Understanding      Falls Prevention: - Provides verbal and written material to individual with discussion of falls prevention and safety.   Cardiac Rehab from 01/31/2020 in [facility] Cardiac and Pulmonary Rehab  Date  01/31/20  Educator  AS  Instruction Review Code  1- Verbalizes Understanding      Other: -Provides group and verbal instruction on various topics (see comments)   Knowledge Questionnaire Score: Knowledge Questionnaire Score - 01/31/20 1454      Knowledge Questionnaire Score   Pre Score  23/26       Core Components/Risk Factors/Patient Goals at Admission: Personal  Goals and Risk Factors at Admission - 01/31/20 1454      Core Components/Risk Factors/Patient Goals on Admission    Weight Management  Yes;Weight Maintenance    Admit Weight  166 lb 12.8 oz (75.7 kg)    Goal Weight: Short Term  165 lb (74.8 kg)    Goal Weight: Long Term  165 lb (74.8 kg)    Expected Outcomes  Short Term: Continue to assess and modify interventions until short term weight is achieved;Weight Maintenance: Understanding of the daily nutrition guidelines, which includes 25-35% calories from fat, 7% or less cal from saturated fats, less than '200mg'$  cholesterol, less than 1.5gm of sodium, & 5 or more servings of fruits and vegetables daily;Long Term: Adherence to nutrition and physical activity/exercise program aimed toward attainment of established weight goal    Intervention  Assist the participant in steps to quit. Provide individualized education and counseling about committing to Tobacco Cessation, relapse prevention, and pharmacological support that can be provided by physician.;Advice worker, assist with locating and accessing local/national Quit Smoking programs, and support quit date choice.    Expected Outcomes  Short Term: Will demonstrate readiness to quit, by selecting a quit date.;Short Term: Will quit all tobacco product use, adhering to prevention of  relapse plan.;Long Term: Complete abstinence from all tobacco products for at least 12 months from quit date.    Intervention  Provide education on lifestyle modifcations including regular physical activity/exercise, weight management, moderate sodium restriction and increased consumption of fresh fruit, vegetables, and low fat dairy, alcohol moderation, and smoking cessation.;Monitor prescription use compliance.    Expected Outcomes  Short Term: Continued assessment and intervention until BP is < 140/80m HG in hypertensive participants. < 130/851mHG in hypertensive participants with diabetes, heart failure or  chronic kidney disease.;Long Term: Maintenance of blood pressure at goal levels.    Intervention  Provide education and support for participant on nutrition & aerobic/resistive exercise along with prescribed medications to achieve LDL '70mg'$ , HDL >'40mg'$ .    Expected Outcomes  Short Term: Participant states understanding of desired cholesterol values and is compliant with medications prescribed. Participant is following exercise prescription and nutrition guidelines.;Long Term: Cholesterol controlled with medications as prescribed, with individualized exercise RX and with personalized nutrition plan. Value goals: LDL < '70mg'$ , HDL > 40 mg.       Education:Diabetes - Individual verbal and written instruction to review signs/symptoms of diabetes, desired ranges of glucose level fasting, after meals and with exercise. Acknowledge that pre and post exercise glucose checks will be done for 3 sessions at entry of program.   Education: Know Your Numbers and Risk Factors: -Group verbal and written instruction about important numbers in your health.  Discussion of what are risk factors and how they play a role in the disease process.  Review of Cholesterol, Blood Pressure, Diabetes, and BMI and the role they play in your overall health.   Core Components/Risk Factors/Patient Goals Review:  Goals and Risk Factor Review    Row Name 03/12/20 1226             Core Components/Risk Factors/Patient Goals Review   Personal Goals Review  Tobacco Cessation       Review  Today ArDragotated he hopes to have a Quit date of May 1       Expected Outcomes  STG: ArRasuls able to meet Quit date goal  LTG: ArWyndellemians tobacco free once he has a Quit date          Core Components/Risk Factors/Patient Goals at Discharge (Final Review):  Goals and Risk Factor Review - 03/12/20 1226      Core Components/Risk Factors/Patient Goals Review   Personal Goals Review  Tobacco Cessation    Review  Today ArWildontated he  hopes to have a Quit date of May 1    Expected Outcomes  STG: ArAldrics able to meet Quit date goal  LTG: ArMattieemians tobacco free once he has a Quit date       ITP Comments: ITP Comments    Row Name 01/30/20 1439 02/09/20 1547 02/21/20 1027 02/28/20 1425 03/12/20 1224   ITP Comments  Virtual Orientation completed today Has appointment for EP Eval and Gym Orientation on 3/3. [facility]  Completed Initial RD Eval  30 day chart review completed. ITP sent to Dr M Zachery Dakinsedical Director, for review,changes as needed and signature.  New to program  Called to check on pt.  Unable to leave message on voicemail for mobile number. Tried home phone, left message.  First full day of exercise!  Patient was oriented to gym and equipment including functions, settings, policies, and procedures.  Patient's individual exercise prescription and treatment plan were reviewed.  All starting workloads were established based on the results of the 6 minute walk test done at initial orientation visit.  The plan for exercise progression was also introduced and progression will be customized based on patient's performance and goals.   [place] Name 03/20/20 0623           ITP Comments  30 Day review completed. Medical Director review done, changes made as directed,and approval shown by signature of Market researcher.  New to program          Comments:

## 2020-03-26 ENCOUNTER — Telehealth: Payer: Self-pay

## 2020-03-26 NOTE — Telephone Encounter (Signed)
Art has lost health and insurance and disability - going on hold for now - will call when he gets insurance straight

## 2020-03-26 NOTE — Telephone Encounter (Signed)
Pt called to be put on hold for rehab at this time as he is switching insurances. Will follow up in two weeks.

## 2020-03-29 ENCOUNTER — Other Ambulatory Visit: Payer: Self-pay | Admitting: Orthopedic Surgery

## 2020-03-29 DIAGNOSIS — M75101 Unspecified rotator cuff tear or rupture of right shoulder, not specified as traumatic: Secondary | ICD-10-CM

## 2020-04-08 ENCOUNTER — Encounter: Payer: Self-pay | Admitting: *Deleted

## 2020-04-08 ENCOUNTER — Telehealth: Payer: Self-pay | Admitting: *Deleted

## 2020-04-08 DIAGNOSIS — I213 ST elevation (STEMI) myocardial infarction of unspecified site: Secondary | ICD-10-CM

## 2020-04-08 NOTE — Telephone Encounter (Signed)
Called to check on insurance status.  Kurt [name] has an appointment today to find out about getting coverage.

## 2020-04-09 ENCOUNTER — Encounter: Payer: 59 | Attending: Internal Medicine

## 2020-04-09 DIAGNOSIS — I213 ST elevation (STEMI) myocardial infarction of unspecified site: Secondary | ICD-10-CM | POA: Insufficient documentation

## 2020-04-09 DIAGNOSIS — Z7902 Long term (current) use of antithrombotics/antiplatelets: Secondary | ICD-10-CM | POA: Insufficient documentation

## 2020-04-09 DIAGNOSIS — I10 Essential (primary) hypertension: Secondary | ICD-10-CM | POA: Insufficient documentation

## 2020-04-09 DIAGNOSIS — Z7982 Long term (current) use of aspirin: Secondary | ICD-10-CM | POA: Insufficient documentation

## 2020-04-09 DIAGNOSIS — Z79899 Other long term (current) drug therapy: Secondary | ICD-10-CM | POA: Insufficient documentation

## 2020-04-09 DIAGNOSIS — J449 Chronic obstructive pulmonary disease, unspecified: Secondary | ICD-10-CM | POA: Insufficient documentation

## 2020-04-09 DIAGNOSIS — F17210 Nicotine dependence, cigarettes, uncomplicated: Secondary | ICD-10-CM | POA: Insufficient documentation

## 2020-04-12 ENCOUNTER — Other Ambulatory Visit: Payer: Self-pay

## 2020-04-12 ENCOUNTER — Ambulatory Visit
Admission: RE | Admit: 2020-04-12 | Discharge: 2020-04-12 | Disposition: A | Discharge status: Home / Self Care | Payer: Self-pay | Source: Ambulatory Visit | Attending: Orthopedic Surgery | Admitting: Orthopedic Surgery

## 2020-04-12 DIAGNOSIS — M75101 Unspecified rotator cuff tear or rupture of right shoulder, not specified as traumatic: Secondary | ICD-10-CM

## 2020-04-17 ENCOUNTER — Telehealth: Payer: Self-pay | Admitting: *Deleted

## 2020-04-17 ENCOUNTER — Encounter: Payer: Self-pay | Admitting: *Deleted

## 2020-04-17 DIAGNOSIS — I213 ST elevation (STEMI) myocardial infarction of unspecified site: Secondary | ICD-10-CM

## 2020-04-17 NOTE — Telephone Encounter (Signed)
Called to check on pt for insurance.  Last spoke he was meeting to discuss with someone.  Left message.

## 2020-04-17 NOTE — Progress Notes (Signed)
Cardiac Individual Treatment Plan  Patient Details  Name: Kurt [name] MRN: [id number] Date of Birth: [birth year] Referring Provider:     Cardiac Rehab from 01/31/2020 in [facility] Cardiac and Pulmonary Rehab  Referring Provider  [facility]      Initial Encounter Date:    Cardiac Rehab from 01/31/2020 in [facility] Cardiac and Pulmonary Rehab  Date  01/31/20      Visit Diagnosis: ST elevation myocardial infarction (STEMI), unspecified artery ([place])  Patient's Home Medications on Admission:  Current Outpatient Medications:  .  aspirin 81 MG tablet, Take 81 mg by mouth daily., Disp: , Rfl:  .  folic acid (FOLVITE) 1 MG tablet, Take 1 tablet (1 mg total) by mouth daily., Disp: 90 tablet, Rfl: 3 .  lisinopril (ZESTRIL) 5 MG tablet, Take 1 tablet (5 mg total) by mouth daily., Disp: 90 tablet, Rfl: 3 .  magnesium oxide (MAG-OX) 400 (241.3 Mg) MG tablet, Take 1 tablet (400 mg total) by mouth daily. (Patient not taking: Reported on 01/30/2020), Disp: 30 tablet, Rfl: 0 .  metoprolol tartrate (LOPRESSOR) 50 MG tablet, Take 1 tablet (50 mg total) by mouth 2 (two) times daily., Disp: 90 tablet, Rfl: 3 .  Multiple Vitamin (MULTIVITAMIN WITH MINERALS) TABS tablet, Take 1 tablet by mouth daily., Disp: 30 tablet, Rfl: 0 .  nicotine (NICODERM CQ - DOSED IN MG/24 HOURS) 21 mg/24hr patch, Place 1 patch (21 mg total) onto the skin daily., Disp: 14 patch, Rfl: 0 .  rosuvastatin (CRESTOR) 40 MG tablet, Take 1 tablet (40 mg total) by mouth daily at 6 PM., Disp: 90 tablet, Rfl: 3 .  thiamine 100 MG tablet, Take 1 tablet (100 mg total) by mouth daily., Disp: 30 tablet, Rfl: 0 .  ticagrelor (BRILINTA) 90 MG TABS tablet, Take 1 tablet (90 mg total) by mouth 2 (two) times daily., Disp: 60 tablet, Rfl: 0 .  traMADol (ULTRAM) 50 MG tablet, Take by mouth every 8 (eight) hours as needed., Disp: , Rfl:   Past Medical History: Past Medical History:  Diagnosis Date  . COPD (chronic obstructive pulmonary disease) ([place])   . ED  (erectile dysfunction)   . Hypertension   . Lumbar disc disease     Tobacco Use: Social History   Tobacco Use  Smoking Status Current Every Day Smoker  . Packs/day: 0.50  . Years: 45.00  . Pack years: 22.50  Smokeless Tobacco Never Used  Tobacco Comment   PLAns Quit date of May 1  still smoking almost a pack a day 4/13    Labs: Recent Review Flowsheet Data    Labs for ITP Cardiac and Pulmonary Rehab Latest Ref Rng & Units 11/24/2019 11/26/2019 11/27/2019 11/30/2019 01/15/2020   Cholestrol 0 - 200 mg/dL - - - - 115   LDLCALC 0 - 99 mg/dL - - - - 59   HDL >39.00 mg/dL - - - - 36.80(L)   Trlycerides 0.0 - 149.0 mg/dL - 227(H) - 100 95.0   Hemoglobin A1c 4.8 - 5.6 % - - - - -   PHART 7.350 - 7.450 7.29(L) - 7.53(H) - -   PCO2ART 32.0 - 48.0 mmHg 54(H) - 35 - -   HCO3 20.0 - 28.0 mmol/L 26.3 - 29.2(H) - -   ACIDBASEDEF 0.0 - 2.0 mmol/L 1.4 - - - -   O2SAT % 99.3 - 97.7 - -       Exercise Target Goals: Exercise Program Goal: Individual exercise prescription set using results from initial 6 min walk test and  THRR while considering  patient's activity barriers and safety.   Exercise Prescription Goal: Initial exercise prescription builds to 30-45 minutes a day of aerobic activity, 2-3 days per week.  Home exercise guidelines will be given to patient during program as part of exercise prescription that the participant will acknowledge.   Education: Aerobic Exercise & Resistance Training: - Gives group verbal and written instruction on the various components of exercise. Focuses on aerobic and resistive training programs and the benefits of this training and how to safely progress through these programs..   Education: Exercise & Equipment Safety: - Individual verbal instruction and demonstration of equipment use and safety with use of the equipment.   Cardiac Rehab from 01/31/2020 in [facility] Cardiac and Pulmonary Rehab  Date  01/31/20  Educator  AS  Instruction Review Code  1-  Verbalizes Understanding      Education: Exercise Physiology & General Exercise Guidelines: - Group verbal and written instruction with models to review the exercise physiology of the cardiovascular system and associated critical values. Provides general exercise guidelines with specific guidelines to those with heart or lung disease.    Education: Flexibility, Balance, Mind/Body Relaxation: Provides group verbal/written instruction on the benefits of flexibility and balance training, including mind/body exercise modes such as yoga, pilates and tai chi.  Demonstration and skill practice provided.   Activity Barriers & Risk Stratification: Activity Barriers & Cardiac Risk Stratification - 01/30/20 1423      Activity Barriers & Cardiac Risk Stratification   Activity Barriers  Muscular Weakness;Deconditioning;Shortness of Breath;Back Problems   Disc missing  bone on bone   Cardiac Risk Stratification  High       6 Minute Walk: 6 Minute Walk    Row Name 01/31/20 1448         6 Minute Walk   Phase  Initial     Distance  1280 feet     Walk Time  6 minutes     # of Rest Breaks  0     MPH  2.42     METS  3.36     RPE  9     Perceived Dyspnea   1     VO2 Peak  11.76     Symptoms  Yes (comment)     Comments  chest pain 3/10 ( has been since MI - has spoken to Dr)  f/u stress test and echo next week     Resting HR  64 bpm     Resting BP  134/84     Resting Oxygen Saturation   97 %     Exercise Oxygen Saturation  during 6 min walk  96 %     Max Ex. HR  80 bpm     Max Ex. BP  144/86     2 Minute Post BP  132/86        Oxygen Initial Assessment:   Oxygen Re-Evaluation:   Oxygen Discharge (Final Oxygen Re-Evaluation):   Initial Exercise Prescription: Initial Exercise Prescription - 01/31/20 1400      Date of Initial Exercise RX and Referring Provider   Date  01/31/20    Referring Provider  [facility]      Treadmill   MPH  2.4    Grade  1    Minutes  15    METs   3.2      Recumbant Bike   Level  1    RPM  60    Minutes  15  METs  3.2      Elliptical   Level  1    Speed  3    Minutes  15      REL-XR   Level  3    Speed  50    Minutes  15    METs  3.2      Prescription Details   Frequency (times per week)  2    Duration  Progress to 30 minutes of continuous aerobic without signs/symptoms of physical distress      Intensity   THRR 40-80% of Max Heartrate  102-140    Ratings of Perceived Exertion  11-13    Perceived Dyspnea  0-4      Resistance Training   Training Prescription  Yes    Weight   3lb    Reps  10-15       Perform Capillary Blood Glucose checks as needed.  Exercise Prescription Changes: Exercise Prescription Changes    Row Name 01/31/20 1400 03/14/20 1200           Response to Exercise   Blood Pressure (Admit)  134/84  134/78      Blood Pressure (Exercise)  144/86  154/86      Blood Pressure (Exit)  132/86  134/76      Heart Rate (Admit)  64 bpm  61 bpm      Heart Rate (Exercise)  80 bpm  88 bpm      Heart Rate (Exit)  67 bpm  67 bpm      Oxygen Saturation (Admit)  97 %  --      Oxygen Saturation (Exercise)  96 %  --      Rating of Perceived Exertion (Exercise)  9  13      Perceived Dyspnea (Exercise)  1  --      Symptoms  chest pain3/10  none      Comments  follow up stress test next week  first full day of exercise      Duration  --  Progress to 30 minutes of  aerobic without signs/symptoms of physical distress      Intensity  --  THRR unchanged        Progression   Progression  --  Continue to progress workloads to maintain intensity without signs/symptoms of physical distress.      Average METs  --  2.05        Resistance Training   Training Prescription  --  Yes      Weight  --   3lb      Reps  --  10-15        Interval Training   Interval Training  --  No        Recumbant Bike   Level  --  1      Minutes  --  15      METs  --  2        NuStep   Level  --  1      Minutes  --  15       METs  --  2.1         Exercise Comments: Exercise Comments    Row Name 03/12/20 1224           Exercise Comments  First full day of exercise!  Patient was oriented to gym and equipment including functions, settings, policies, and procedures.  Patient's individual exercise prescription and treatment plan were reviewed.  All starting workloads were established based on the results of the 6 minute walk test done at initial orientation visit.  The plan for exercise progression was also introduced and progression will be customized based on patient's performance and goals.          Exercise Goals and Review: Exercise Goals    Row Name 01/31/20 1452             Exercise Goals   Increase Physical Activity  Yes       Intervention  Provide advice, education, support and counseling about physical activity/exercise needs.;Develop an individualized exercise prescription for aerobic and resistive training based on initial evaluation findings, risk stratification, comorbidities and participant's personal goals.       Expected Outcomes  Short Term: Attend rehab on a regular basis to increase amount of physical activity.;Long Term: Add in home exercise to make exercise part of routine and to increase amount of physical activity.;Long Term: Exercising regularly at least 3-5 days a week.       Increase Strength and Stamina  Yes       Intervention  Provide advice, education, support and counseling about physical activity/exercise needs.;Develop an individualized exercise prescription for aerobic and resistive training based on initial evaluation findings, risk stratification, comorbidities and participant's personal goals.       Expected Outcomes  Short Term: Increase workloads from initial exercise prescription for resistance, speed, and METs.;Long Term: Improve cardiorespiratory fitness, muscular endurance and strength as measured by increased METs and functional capacity (6MWT)       Able to  understand and use rate of perceived exertion (RPE) scale  Yes       Intervention  Provide education and explanation on how to use RPE scale       Expected Outcomes  Short Term: Able to use RPE daily in rehab to express subjective intensity level;Long Term:  Able to use RPE to guide intensity level when exercising independently       Able to understand and use Dyspnea scale  Yes       Intervention  Provide education and explanation on how to use Dyspnea scale       Expected Outcomes  Short Term: Able to use Dyspnea scale daily in rehab to express subjective sense of shortness of breath during exertion;Long Term: Able to use Dyspnea scale to guide intensity level when exercising independently       Knowledge and understanding of Target Heart Rate Range (THRR)  Yes       Intervention  Provide education and explanation of THRR including how the numbers were predicted and where they are located for reference       Expected Outcomes  Short Term: Able to state/look up THRR;Short Term: Able to use daily as guideline for intensity in rehab;Long Term: Able to use THRR to govern intensity when exercising independently       Able to check pulse independently  Yes       Intervention  Provide education and demonstration on how to check pulse in carotid and radial arteries.;Review the importance of being able to check your own pulse for safety during independent exercise       Expected Outcomes  Short Term: Able to explain why pulse checking is important during independent exercise;Long Term: Able to check pulse independently and accurately       Understanding of Exercise Prescription  Yes       Intervention  Provide education, explanation, and written materials on  patient's individual exercise prescription       Expected Outcomes  Short Term: Able to explain program exercise prescription;Long Term: Able to explain home exercise prescription to exercise independently          Exercise Goals Re-Evaluation  : Exercise Goals Re-Evaluation    Row Name 02/13/20 1600 02/28/20 1425 03/12/20 1224 04/08/20 0923       Exercise Goal Re-Evaluation   Exercise Goals Review  --  --  Able to understand and use rate of perceived exertion (RPE) scale;Knowledge and understanding of Target Heart Rate Range (THRR);Able to check pulse independently;Understanding of Exercise Prescription  --    Comments  Pt has only completed orientation appointment thus far.  Pt has only completed orientation appointment thus far.  Reviewed RPE scale, THR and program prescription with pt today.  Pt voiced understanding and was given a copy of goals to take home.  Out since last review due to insurance.    Expected Outcomes  --  --  Short: Use RPE daily to regulate intensity. Long: Follow program prescription in [facility].  --       Discharge Exercise Prescription (Final Exercise Prescription Changes): Exercise Prescription Changes - 03/14/20 1200      Response to Exercise   Blood Pressure (Admit)  134/78    Blood Pressure (Exercise)  154/86    Blood Pressure (Exit)  134/76    Heart Rate (Admit)  61 bpm    Heart Rate (Exercise)  88 bpm    Heart Rate (Exit)  67 bpm    Rating of Perceived Exertion (Exercise)  13    Symptoms  none    Comments  first full day of exercise    Duration  Progress to 30 minutes of  aerobic without signs/symptoms of physical distress    Intensity  THRR unchanged      Progression   Progression  Continue to progress workloads to maintain intensity without signs/symptoms of physical distress.    Average METs  2.05      Resistance Training   Training Prescription  Yes    Weight   3lb    Reps  10-15      Interval Training   Interval Training  No      Recumbant Bike   Level  1    Minutes  15    METs  2      NuStep   Level  1    Minutes  15    METs  2.1       Nutrition:  Target Goals: Understanding of nutrition guidelines, daily intake of sodium <1588m, cholesterol <2068m calories 30% from  fat and 7% or less from saturated fats, daily to have 5 or more servings of fruits and vegetables.  Education: Controlling Sodium/Reading Food Labels -Group verbal and written material supporting the discussion of sodium use in heart healthy nutrition. Review and explanation with models, verbal and written materials for utilization of the food label.   Education: General Nutrition Guidelines/Fats and Fiber: -Group instruction provided by verbal, written material, models and posters to present the general guidelines for heart healthy nutrition. Gives an explanation and review of dietary fats and fiber.   Biometrics: Pre Biometrics - 01/31/20 1453      Pre Biometrics   Height  _0  (1.702 m)    Weight  166 lb 12.8 oz (75.7 kg)    BMI (Calculated)  26.12    Single Leg Stand  21.53 seconds  Nutrition Therapy Plan and Nutrition Goals: Nutrition Therapy & Goals - 02/05/20 1101      Nutrition Therapy   Diet  HH, Low Na    Fiber  30 grams    Whole Grain Foods  3 servings    Saturated Fats  12 max. grams    Fruits and Vegetables  5 servings/day    Sodium  1.5 grams      Personal Nutrition Goals   Nutrition Goal  ST: LT: build strength to go back to work (possible retirement). 6/10    Comments  Pt still smoking (1/2 pack), wants to start quitting and wants to be done by the end of the month. Using the patch and nicoteine losenges (esp. after eating). B: bacon/sausage egg and cheese (made at home). L: small sandwich (bolonga or liverweirst) with some chips D:chicken, beef (3x/week) - with fries (buys meat from [employer] - prepackaged). With stepson home eats less vegetables; pt with eat mixed vegetables or broccoli and cheese and green beans.      Intervention Plan   Intervention  Prescribe, educate and counsel regarding individualized specific dietary modifications aiming towards targeted core components such as weight, hypertension, lipid management, diabetes, heart failure and  other comorbidities.;Nutrition handout(s) given to patient.    Expected Outcomes  Short Term Goal: Understand basic principles of dietary content, such as calories, fat, sodium, cholesterol and nutrients.;Short Term Goal: A plan has been developed with personal nutrition goals set during dietitian appointment.;Long Term Goal: Adherence to prescribed nutrition plan.       Nutrition Assessments: Nutrition Assessments - 01/31/20 1452      MEDFICTS Scores   Pre Score  66       MEDIFICTS Score Key:          ?70 Need to make dietary changes          40-70 Heart Healthy Diet         ? 40 Therapeutic Level Cholesterol Diet  Nutrition Goals Re-Evaluation:   Nutrition Goals Discharge (Final Nutrition Goals Re-Evaluation):   Psychosocial: Target Goals: Acknowledge presence or absence of significant depression and/or stress, maximize coping skills, provide positive support system. Participant is able to verbalize types and ability to use techniques and skills needed for reducing stress and depression.   Education: Depression - Provides group verbal and written instruction on the correlation between heart/lung disease and depressed mood, treatment options, and the stigmas associated with seeking treatment.   Education: Sleep Hygiene -Provides group verbal and written instruction about how sleep can affect your health.  Define sleep hygiene, discuss sleep cycles and impact of sleep habits. Review good sleep hygiene tips.     Education: Stress and Anxiety: - Provides group verbal and written instruction about the health risks of elevated stress and causes of high stress.  Discuss the correlation between heart/lung disease and anxiety and treatment options. Review healthy ways to manage with stress and anxiety.    Initial Review & Psychosocial Screening: Initial Psych Review & Screening - 01/30/20 [place]?  --   Son in [place] is getting transfer to  [employer]. Stepson lives next door and is at [employer] home most of day every day.      Quality of Life Scores:  Quality of Life - 01/31/20 1453      Quality of Life   Select  Quality of Life      Quality of Life Scores  Health/Function Pre  25.77 %    Socioeconomic Pre  27.56 %    Psych/Spiritual Pre  29.29 %    Family Pre  26 %    GLOBAL Pre  26.91 %      Scores of 19 and below usually indicate a poorer quality of life in these areas.  A difference of  2-3 points is a clinically meaningful difference.  A difference of 2-3 points in the total score of the Quality of Life Index has been associated with significant improvement in overall quality of life, self-image, physical symptoms, and general health in studies assessing change in quality of life.  PHQ-9: Recent Review Flowsheet Data    Depression screen [facility] 2/9 03/14/2020 01/31/2020 01/15/2020 03/16/2018   Decreased Interest 0 0 0 0   Down, Depressed, Hopeless 0 0 0 0   PHQ - 2 Score 0 0 0 0   Altered sleeping 3 3 - -   Tired, decreased energy 1 3 - -   Change in appetite 0 0 - -   Feeling bad or failure about yourself  0 0 - -   Trouble concentrating 0 0 - -   Moving slowly or fidgety/restless 0 - - -   Suicidal thoughts 0 0 - -   PHQ-9 Score 4 6 - -   Difficult doing work/chores Somewhat difficult Somewhat difficult - -     Interpretation of Total Score  Total Score Depression Severity:  1-4 = Minimal depression, 5-9 = Mild depression, 10-14 = Moderate depression, 15-19 = Moderately severe depression, 20-27 = Severe depression   Psychosocial Evaluation and Intervention: Psychosocial Evaluation - 01/30/20 1441      Psychosocial Evaluation & Interventions   Interventions  Encouraged to exercise with the program and follow exercise prescription;Relaxation education    Comments  Martel should do well with the program. He has no barriers and has a good support system.  He is wanting to QUIT smoking and has tried several  times.  Hopefully we will be able to help him be successful with his desire to quit. His wife passed away last fall.  He does have a puuppy living with him.    Expected Outcomes  STG: Tiger is able to attend program routinely. LTG Zlatan will discharge with the tools and resources to help him fight his heart disease    Continue Psychosocial Services   Follow up required by staff       Psychosocial Re-Evaluation:   Psychosocial Discharge (Final Psychosocial Re-Evaluation):   Vocational Rehabilitation: Provide vocational rehab assistance to qualifying candidates.   Vocational Rehab Evaluation & Intervention: Vocational Rehab - 01/30/20 1427      Initial Vocational Rehab Evaluation & Intervention   Assessment shows need for Vocational Rehabilitation  No       Education: Education Goals: Education classes will be provided on a variety of topics geared toward better understanding of heart health and risk factor modification. Participant will state understanding/return demonstration of topics presented as noted by education test scores.  Learning Barriers/Preferences: Learning Barriers/Preferences - 01/30/20 1427      Learning Barriers/Preferences   Learning Barriers  None    Learning Preferences  None       General Cardiac Education Topics:  AED/CPR: - Group verbal and written instruction with the use of models to demonstrate the basic use of the AED with the basic ABC's of resuscitation.   Anatomy & Physiology of the Heart: - Group verbal and written instruction and  models provide basic cardiac anatomy and physiology, with the coronary electrical and arterial systems. Review of Valvular disease and Heart Failure   Cardiac Procedures: - Group verbal and written instruction to review commonly prescribed medications for heart disease. Reviews the medication, class of the drug, and side effects. Includes the steps to properly store meds and maintain the prescription regimen.  (beta blockers and nitrates)   Cardiac Medications I: - Group verbal and written instruction to review commonly prescribed medications for heart disease. Reviews the medication, class of the drug, and side effects. Includes the steps to properly store meds and maintain the prescription regimen.   Cardiac Medications II: -Group verbal and written instruction to review commonly prescribed medications for heart disease. Reviews the medication, class of the drug, and side effects. (all other drug classes)    Go Sex-Intimacy & Heart Disease, Get SMART - Goal Setting: - Group verbal and written instruction through game format to discuss heart disease and the return to sexual intimacy. Provides group verbal and written material to discuss and apply goal setting through the application of the S.M.A.R.T. Method.   Other Matters of the Heart: - Provides group verbal, written materials and models to describe Stable Angina and Peripheral Artery. Includes description of the disease process and treatment options available to the cardiac patient.   Infection Prevention: - Provides verbal and written material to individual with discussion of infection control including proper hand washing and proper equipment cleaning during exercise session.   Cardiac Rehab from 01/31/2020 in [facility] Cardiac and Pulmonary Rehab  Date  01/31/20  Educator  AS  Instruction Review Code  1- Verbalizes Understanding      Falls Prevention: - Provides verbal and written material to individual with discussion of falls prevention and safety.   Cardiac Rehab from 01/31/2020 in [facility] Cardiac and Pulmonary Rehab  Date  01/31/20  Educator  AS  Instruction Review Code  1- Verbalizes Understanding      Other: -Provides group and verbal instruction on various topics (see comments)   Knowledge Questionnaire Score: Knowledge Questionnaire Score - 01/31/20 1454      Knowledge Questionnaire Score   Pre Score  23/26       Core  Components/Risk Factors/Patient Goals at Admission: Personal Goals and Risk Factors at Admission - 01/31/20 1454      Core Components/Risk Factors/Patient Goals on Admission    Weight Management  Yes;Weight Maintenance    Admit Weight  166 lb 12.8 oz (75.7 kg)    Goal Weight: Short Term  165 lb (74.8 kg)    Goal Weight: Long Term  165 lb (74.8 kg)    Expected Outcomes  Short Term: Continue to assess and modify interventions until short term weight is achieved;Weight Maintenance: Understanding of the daily nutrition guidelines, which includes 25-35% calories from fat, 7% or less cal from saturated fats, less than 274m cholesterol, less than 1.5gm of sodium, & 5 or more servings of fruits and vegetables daily;Long Term: Adherence to nutrition and physical activity/exercise program aimed toward attainment of established weight goal    Intervention  Assist the participant in steps to quit. Provide individualized education and counseling about committing to Tobacco Cessation, relapse prevention, and pharmacological support that can be provided by physician.;OAdvice worker assist with locating and accessing local/national Quit Smoking programs, and support quit date choice.    Expected Outcomes  Short Term: Will demonstrate readiness to quit, by selecting a quit date.;Short Term: Will quit all tobacco product use, adhering  to prevention of relapse plan.;Long Term: Complete abstinence from all tobacco products for at least 12 months from quit date.    Intervention  Provide education on lifestyle modifcations including regular physical activity/exercise, weight management, moderate sodium restriction and increased consumption of fresh fruit, vegetables, and low fat dairy, alcohol moderation, and smoking cessation.;Monitor prescription use compliance.    Expected Outcomes  Short Term: Continued assessment and intervention until BP is < 140/102m HG in hypertensive participants. < 130/866mHG in  hypertensive participants with diabetes, heart failure or chronic kidney disease.;Long Term: Maintenance of blood pressure at goal levels.    Intervention  Provide education and support for participant on nutrition & aerobic/resistive exercise along with prescribed medications to achieve LDL <7050mHDL >73m24m  Expected Outcomes  Short Term: Participant states understanding of desired cholesterol values and is compliant with medications prescribed. Participant is following exercise prescription and nutrition guidelines.;Long Term: Cholesterol controlled with medications as prescribed, with individualized exercise RX and with personalized nutrition plan. Value goals: LDL < 70mg88mL > 40 mg.       Education:Diabetes - Individual verbal and written instruction to review signs/symptoms of diabetes, desired ranges of glucose level fasting, after meals and with exercise. Acknowledge that pre and post exercise glucose checks will be done for 3 sessions at entry of program.   Education: Know Your Numbers and Risk Factors: -Group verbal and written instruction about important numbers in your health.  Discussion of what are risk factors and how they play a role in the disease process.  Review of Cholesterol, Blood Pressure, Diabetes, and BMI and the role they play in your overall health.   Core Components/Risk Factors/Patient Goals Review:  Goals and Risk Factor Review    Row Name 03/12/20 1226             Core Components/Risk Factors/Patient Goals Review   Personal Goals Review  Tobacco Cessation       Review  Today ArthuAdmired he hopes to have a Quit date of May 1       Expected Outcomes  STG: ArthuBodieble to meet Quit date goal  LTG: ArthuNahomans tobacco free once he has a Quit date          Core Components/Risk Factors/Patient Goals at Discharge (Final Review):  Goals and Risk Factor Review - 03/12/20 1226      Core Components/Risk Factors/Patient Goals Review   Personal Goals  Review  Tobacco Cessation    Review  Today ArthuAashired he hopes to have a Quit date of May 1    Expected Outcomes  STG: ArthuQuranble to meet Quit date goal  LTG: ArthuEvannans tobacco free once he has a Quit date       ITP Comments: ITP Comments    Row Name 01/30/20 1439 02/09/20 1547 02/21/20 1027 02/28/20 1425 03/12/20 1224   ITP Comments  Virtual Orientation completed today Has appointment for EP Eval and Gym Orientation on 3/3. [facility]ation of diagnosis [facility] be found [facility]  Completed Initial RD Eval  30 day chart review completed. ITP sent to Dr M MilZachery Dakinscal Director, for review,changes as needed and signature.  New to program  Called to check on pt.  Unable to leave message on voicemail for mobile number. Tried home phone, left message.  First full day of exercise!  Patient was oriented to gym and equipment including functions, settings, policies, and procedures.  Patient's individual exercise prescription and treatment  plan were reviewed.  All starting workloads were established based on the results of the 6 minute walk test done at initial orientation visit.  The plan for exercise progression was also introduced and progression will be customized based on patient's performance and goals.   [place] Name 03/20/20 0623 04/08/20 0920 04/17/20 0725       ITP Comments  30 Day review completed. Medical Director review done, changes made as directed,and approval shown by signature of Market researcher.  New to program  Art has been out due to switching insurances.  Called to check on insurance status.  Art has an appointment today to find out about getting coverage.  30 Day review completed. ITP reviewed,changes made as needed and signed by program Medical Director        Comments:

## 2020-04-22 NOTE — Progress Notes (Signed)
Cardiac Individual Treatment Plan  Patient Details  Name: Kurt [name] MRN: [id number] Date of Birth: June 06, 1956 Referring Provider:     Cardiac Rehab from 01/31/2020 in [facility] Cardiac and Pulmonary Rehab  Referring Provider  [facility]      Initial Encounter Date:    Cardiac Rehab from 01/31/2020 in [facility] Cardiac and Pulmonary Rehab  Date  01/31/20      Visit Diagnosis: No diagnosis found.  Patient's Home Medications on Admission:  Current Outpatient Medications:  .  aspirin 81 MG tablet, Take 81 mg by mouth daily., Disp: , Rfl:  .  folic acid (FOLVITE) 1 MG tablet, Take 1 tablet (1 mg total) by mouth daily., Disp: 90 tablet, Rfl: 3 .  lisinopril (ZESTRIL) 5 MG tablet, Take 1 tablet (5 mg total) by mouth daily., Disp: 90 tablet, Rfl: 3 .  magnesium oxide (MAG-OX) 400 (241.3 Mg) MG tablet, Take 1 tablet (400 mg total) by mouth daily. (Patient not taking: Reported on 01/30/2020), Disp: 30 tablet, Rfl: 0 .  metoprolol tartrate (LOPRESSOR) 50 MG tablet, Take 1 tablet (50 mg total) by mouth 2 (two) times daily., Disp: 90 tablet, Rfl: 3 .  Multiple Vitamin (MULTIVITAMIN WITH MINERALS) TABS tablet, Take 1 tablet by mouth daily., Disp: 30 tablet, Rfl: 0 .  nicotine (NICODERM CQ - DOSED IN MG/24 HOURS) 21 mg/24hr patch, Place 1 patch (21 mg total) onto the skin daily., Disp: 14 patch, Rfl: 0 .  rosuvastatin (CRESTOR) 40 MG tablet, Take 1 tablet (40 mg total) by mouth daily at 6 PM., Disp: 90 tablet, Rfl: 3 .  thiamine 100 MG tablet, Take 1 tablet (100 mg total) by mouth daily., Disp: 30 tablet, Rfl: 0 .  ticagrelor (BRILINTA) 90 MG TABS tablet, Take 1 tablet (90 mg total) by mouth 2 (two) times daily., Disp: 60 tablet, Rfl: 0 .  traMADol (ULTRAM) 50 MG tablet, Take by mouth every 8 (eight) hours as needed., Disp: , Rfl:   Past Medical History: Past Medical History:  Diagnosis Date  . COPD (chronic obstructive pulmonary disease) ([place])   . ED (erectile dysfunction)   . Hypertension   . Lumbar  disc disease     Tobacco Use: Social History   Tobacco Use  Smoking Status Current Every Day Smoker  . Packs/day: 0.50  . Years: 45.00  . Pack years: 22.50  Smokeless Tobacco Never Used  Tobacco Comment   PLAns Quit date of May 1  still smoking almost a pack a day 4/13    Labs: Recent Review Flowsheet Data    Labs for ITP Cardiac and Pulmonary Rehab Latest Ref Rng & Units 11/24/2019 11/26/2019 11/27/2019 11/30/2019 01/15/2020   Cholestrol 0 - 200 mg/dL - - - - 115   LDLCALC 0 - 99 mg/dL - - - - 59   HDL >39.00 mg/dL - - - - 36.80(L)   Trlycerides 0.0 - 149.0 mg/dL - 227(H) - 100 95.0   Hemoglobin A1c 4.8 - 5.6 % - - - - -   PHART 7.350 - 7.450 7.29(L) - 7.53(H) - -   PCO2ART 32.0 - 48.0 mmHg 54(H) - 35 - -   HCO3 20.0 - 28.0 mmol/L 26.3 - 29.2(H) - -   ACIDBASEDEF 0.0 - 2.0 mmol/L 1.4 - - - -   O2SAT % 99.3 - 97.7 - -       Exercise Target Goals: Exercise Program Goal: Individual exercise prescription set using results from initial 6 min walk test and THRR while considering  patient's  activity barriers and safety.   Exercise Prescription Goal: Initial exercise prescription builds to 30-45 minutes a day of aerobic activity, 2-3 days per week.  Home exercise guidelines will be given to patient during program as part of exercise prescription that the participant will acknowledge.   Education: Aerobic Exercise & Resistance Training: - Gives group verbal and written instruction on the various components of exercise. Focuses on aerobic and resistive training programs and the benefits of this training and how to safely progress through these programs..   Education: Exercise & Equipment Safety: - Individual verbal instruction and demonstration of equipment use and safety with use of the equipment.   Cardiac Rehab from 01/31/2020 in [facility] Cardiac and Pulmonary Rehab  Date  01/31/20  Educator  AS  Instruction Review Code  1- Verbalizes Understanding      Education: Exercise  Physiology & General Exercise Guidelines: - Group verbal and written instruction with models to review the exercise physiology of the cardiovascular system and associated critical values. Provides general exercise guidelines with specific guidelines to those with heart or lung disease.    Education: Flexibility, Balance, Mind/Body Relaxation: Provides group verbal/written instruction on the benefits of flexibility and balance training, including mind/body exercise modes such as yoga, pilates and tai chi.  Demonstration and skill practice provided.   Activity Barriers & Risk Stratification: Activity Barriers & Cardiac Risk Stratification - 01/30/20 1423      Activity Barriers & Cardiac Risk Stratification   Activity Barriers  Muscular Weakness;Deconditioning;Shortness of Breath;Back Problems   Disc missing  bone on bone   Cardiac Risk Stratification  High       6 Minute Walk: 6 Minute Walk    Row Name 01/31/20 1448         6 Minute Walk   Phase  Initial     Distance  1280 feet     Walk Time  6 minutes     # of Rest Breaks  0     MPH  2.42     METS  3.36     RPE  9     Perceived Dyspnea   1     VO2 Peak  11.76     Symptoms  Yes (comment)     Comments  chest pain 3/10 ( has been since MI - has spoken to Dr)  f/u stress test and echo next week     Resting HR  64 bpm     Resting BP  134/84     Resting Oxygen Saturation   97 %     Exercise Oxygen Saturation  during 6 min walk  96 %     Max Ex. HR  80 bpm     Max Ex. BP  144/86     2 Minute Post BP  132/86        Oxygen Initial Assessment:   Oxygen Re-Evaluation:   Oxygen Discharge (Final Oxygen Re-Evaluation):   Initial Exercise Prescription: Initial Exercise Prescription - 01/31/20 1400      Date of Initial Exercise RX and Referring Provider   Date  01/31/20    Referring Provider  [facility]      Treadmill   MPH  2.4    Grade  1    Minutes  15    METs  3.2      Recumbant Bike   Level  1    RPM  60     Minutes  15    METs  3.2  Elliptical   Level  1    Speed  3    Minutes  15      REL-XR   Level  3    Speed  50    Minutes  15    METs  3.2      Prescription Details   Frequency (times per week)  2    Duration  Progress to 30 minutes of continuous aerobic without signs/symptoms of physical distress      Intensity   THRR 40-80% of Max Heartrate  102-140    Ratings of Perceived Exertion  11-13    Perceived Dyspnea  0-4      Resistance Training   Training Prescription  Yes    Weight   3lb    Reps  10-15       Perform Capillary Blood Glucose checks as needed.  Exercise Prescription Changes: Exercise Prescription Changes    Row Name 01/31/20 1400 03/14/20 1200           Response to Exercise   Blood Pressure (Admit)  134/84  134/78      Blood Pressure (Exercise)  144/86  154/86      Blood Pressure (Exit)  132/86  134/76      Heart Rate (Admit)  64 bpm  61 bpm      Heart Rate (Exercise)  80 bpm  88 bpm      Heart Rate (Exit)  67 bpm  67 bpm      Oxygen Saturation (Admit)  97 %  --      Oxygen Saturation (Exercise)  96 %  --      Rating of Perceived Exertion (Exercise)  9  13      Perceived Dyspnea (Exercise)  1  --      Symptoms  chest pain3/10  none      Comments  follow up stress test next week  first full day of exercise      Duration  --  Progress to 30 minutes of  aerobic without signs/symptoms of physical distress      Intensity  --  THRR unchanged        Progression   Progression  --  Continue to progress workloads to maintain intensity without signs/symptoms of physical distress.      Average METs  --  2.05        Resistance Training   Training Prescription  --  Yes      Weight  --   3lb      Reps  --  10-15        Interval Training   Interval Training  --  No        Recumbant Bike   Level  --  1      Minutes  --  15      METs  --  2        NuStep   Level  --  1      Minutes  --  15      METs  --  2.1         Exercise  Comments: Exercise Comments    Row Name 03/12/20 1224           Exercise Comments  First full day of exercise!  Patient was oriented to gym and equipment including functions, settings, policies, and procedures.  Patient's individual exercise prescription and treatment plan were reviewed.  All starting workloads were established based on  the results of the 6 minute walk test done at initial orientation visit.  The plan for exercise progression was also introduced and progression will be customized based on patient's performance and goals.          Exercise Goals and Review: Exercise Goals    Row Name 01/31/20 1452             Exercise Goals   Increase Physical Activity  Yes       Intervention  Provide advice, education, support and counseling about physical activity/exercise needs.;Develop an individualized exercise prescription for aerobic and resistive training based on initial evaluation findings, risk stratification, comorbidities and participant's personal goals.       Expected Outcomes  Short Term: Attend rehab on a regular basis to increase amount of physical activity.;Long Term: Add in home exercise to make exercise part of routine and to increase amount of physical activity.;Long Term: Exercising regularly at least 3-5 days a week.       Increase Strength and Stamina  Yes       Intervention  Provide advice, education, support and counseling about physical activity/exercise needs.;Develop an individualized exercise prescription for aerobic and resistive training based on initial evaluation findings, risk stratification, comorbidities and participant's personal goals.       Expected Outcomes  Short Term: Increase workloads from initial exercise prescription for resistance, speed, and METs.;Long Term: Improve cardiorespiratory fitness, muscular endurance and strength as measured by increased METs and functional capacity (6MWT)       Able to understand and use rate of perceived exertion  (RPE) scale  Yes       Intervention  Provide education and explanation on how to use RPE scale       Expected Outcomes  Short Term: Able to use RPE daily in rehab to express subjective intensity level;Long Term:  Able to use RPE to guide intensity level when exercising independently       Able to understand and use Dyspnea scale  Yes       Intervention  Provide education and explanation on how to use Dyspnea scale       Expected Outcomes  Short Term: Able to use Dyspnea scale daily in rehab to express subjective sense of shortness of breath during exertion;Long Term: Able to use Dyspnea scale to guide intensity level when exercising independently       Knowledge and understanding of Target Heart Rate Range (THRR)  Yes       Intervention  Provide education and explanation of THRR including how the numbers were predicted and where they are located for reference       Expected Outcomes  Short Term: Able to state/look up THRR;Short Term: Able to use daily as guideline for intensity in rehab;Long Term: Able to use THRR to govern intensity when exercising independently       Able to check pulse independently  Yes       Intervention  Provide education and demonstration on how to check pulse in carotid and radial arteries.;Review the importance of being able to check your own pulse for safety during independent exercise       Expected Outcomes  Short Term: Able to explain why pulse checking is important during independent exercise;Long Term: Able to check pulse independently and accurately       Understanding of Exercise Prescription  Yes       Intervention  Provide education, explanation, and written materials on patient's individual exercise prescription  Expected Outcomes  Short Term: Able to explain program exercise prescription;Long Term: Able to explain home exercise prescription to exercise independently          Exercise Goals Re-Evaluation : Exercise Goals Re-Evaluation    Row Name 02/13/20  1600 02/28/20 1425 03/12/20 1224 04/08/20 0923       Exercise Goal Re-Evaluation   Exercise Goals Review  --  --  Able to understand and use rate of perceived exertion (RPE) scale;Knowledge and understanding of Target Heart Rate Range (THRR);Able to check pulse independently;Understanding of Exercise Prescription  --    Comments  Pt has only completed orientation appointment thus far.  Pt has only completed orientation appointment thus far.  Reviewed RPE scale, THR and program prescription with pt today.  Pt voiced understanding and was given a copy of goals to take home.  Out since last review due to insurance.    Expected Outcomes  --  --  Short: Use RPE daily to regulate intensity. Long: Follow program prescription in [facility].  --       Discharge Exercise Prescription (Final Exercise Prescription Changes): Exercise Prescription Changes - 03/14/20 1200      Response to Exercise   Blood Pressure (Admit)  134/78    Blood Pressure (Exercise)  154/86    Blood Pressure (Exit)  134/76    Heart Rate (Admit)  61 bpm    Heart Rate (Exercise)  88 bpm    Heart Rate (Exit)  67 bpm    Rating of Perceived Exertion (Exercise)  13    Symptoms  none    Comments  first full day of exercise    Duration  Progress to 30 minutes of  aerobic without signs/symptoms of physical distress    Intensity  THRR unchanged      Progression   Progression  Continue to progress workloads to maintain intensity without signs/symptoms of physical distress.    Average METs  2.05      Resistance Training   Training Prescription  Yes    Weight   3lb    Reps  10-15      Interval Training   Interval Training  No      Recumbant Bike   Level  1    Minutes  15    METs  2      NuStep   Level  1    Minutes  15    METs  2.1       Nutrition:  Target Goals: Understanding of nutrition guidelines, daily intake of sodium <1547m, cholesterol <2021m calories 30% from fat and 7% or less from saturated fats, daily to have 5  or more servings of fruits and vegetables.  Education: Controlling Sodium/Reading Food Labels -Group verbal and written material supporting the discussion of sodium use in heart healthy nutrition. Review and explanation with models, verbal and written materials for utilization of the food label.   Education: General Nutrition Guidelines/Fats and Fiber: -Group instruction provided by verbal, written material, models and posters to present the general guidelines for heart healthy nutrition. Gives an explanation and review of dietary fats and fiber.   Biometrics: Pre Biometrics - 01/31/20 1453      Pre Biometrics   Height  5' 7" (1.702 m)    Weight  166 lb 12.8 oz (75.7 kg)    BMI (Calculated)  26.12    Single Leg Stand  21.53 seconds        Nutrition Therapy Plan and Nutrition Goals:  Nutrition Therapy & Goals - 02/05/20 1101      Nutrition Therapy   Diet  HH, Low Na    Fiber  30 grams    Whole Grain Foods  3 servings    Saturated Fats  12 max. grams    Fruits and Vegetables  5 servings/day    Sodium  1.5 grams      Personal Nutrition Goals   Nutrition Goal  ST: LT: build strength to go back to work (possible retirement). 6/10    Comments  Pt still smoking (1/2 pack), wants to start quitting and wants to be done by the end of the month. Using the patch and nicoteine losenges (esp. after eating). B: bacon/sausage egg and cheese (made at home). L: small sandwich (bolonga or liverweirst) with some chips D:chicken, beef (3x/week) - with fries (buys meat from [employer] - prepackaged). With stepson home eats less vegetables; pt with eat mixed vegetables or broccoli and cheese and green beans.      Intervention Plan   Intervention  Prescribe, educate and counsel regarding individualized specific dietary modifications aiming towards targeted core components such as weight, hypertension, lipid management, diabetes, heart failure and other comorbidities.;Nutrition handout(s) given to  patient.    Expected Outcomes  Short Term Goal: Understand basic principles of dietary content, such as calories, fat, sodium, cholesterol and nutrients.;Short Term Goal: A plan has been developed with personal nutrition goals set during dietitian appointment.;Long Term Goal: Adherence to prescribed nutrition plan.       Nutrition Assessments: Nutrition Assessments - 01/31/20 1452      MEDFICTS Scores   Pre Score  66       MEDIFICTS Score Key:          ?70 Need to make dietary changes          40-70 Heart Healthy Diet         ? 40 Therapeutic Level Cholesterol Diet  Nutrition Goals Re-Evaluation:   Nutrition Goals Discharge (Final Nutrition Goals Re-Evaluation):   Psychosocial: Target Goals: Acknowledge presence or absence of significant depression and/or stress, maximize coping skills, provide positive support system. Participant is able to verbalize types and ability to use techniques and skills needed for reducing stress and depression.   Education: Depression - Provides group verbal and written instruction on the correlation between heart/lung disease and depressed mood, treatment options, and the stigmas associated with seeking treatment.   Education: Sleep Hygiene -Provides group verbal and written instruction about how sleep can affect your health.  Define sleep hygiene, discuss sleep cycles and impact of sleep habits. Review good sleep hygiene tips.     Education: Stress and Anxiety: - Provides group verbal and written instruction about the health risks of elevated stress and causes of high stress.  Discuss the correlation between heart/lung disease and anxiety and treatment options. Review healthy ways to manage with stress and anxiety.    Initial Review & Psychosocial Screening: Initial Psych Review & Screening - 01/30/20 [place]?  --   Son in [place] is getting transfer to [employer]. Stepson lives next door and is at  [facility] home most of day every day.      Quality of Life Scores:  Quality of Life - 01/31/20 1453      Quality of Life   Select  Quality of Life      Quality of Life Scores   Health/Function Pre  25.77 %  Socioeconomic Pre  27.56 %    Psych/Spiritual Pre  29.29 %    Family Pre  26 %    GLOBAL Pre  26.91 %      Scores of 19 and below usually indicate a poorer quality of life in these areas.  A difference of  2-3 points is a clinically meaningful difference.  A difference of 2-3 points in the total score of the Quality of Life Index has been associated with significant improvement in overall quality of life, self-image, physical symptoms, and general health in studies assessing change in quality of life.  PHQ-9: Recent Review Flowsheet Data    Depression screen [facility] 2/9 03/14/2020 01/31/2020 01/15/2020 03/16/2018   Decreased Interest 0 0 0 0   Down, Depressed, Hopeless 0 0 0 0   PHQ - 2 Score 0 0 0 0   Altered sleeping 3 3 - -   Tired, decreased energy 1 3 - -   Change in appetite 0 0 - -   Feeling bad or failure about yourself  0 0 - -   Trouble concentrating 0 0 - -   Moving slowly or fidgety/restless 0 - - -   Suicidal thoughts 0 0 - -   PHQ-9 Score 4 6 - -   Difficult doing work/chores Somewhat difficult Somewhat difficult - -     Interpretation of Total Score  Total Score Depression Severity:  1-4 = Minimal depression, 5-9 = Mild depression, 10-14 = Moderate depression, 15-19 = Moderately severe depression, 20-27 = Severe depression   Psychosocial Evaluation and Intervention: Psychosocial Evaluation - 01/30/20 1441      Psychosocial Evaluation & Interventions   Interventions  Encouraged to exercise with the program and follow exercise prescription;Relaxation education    Comments  Ethanael should do well with the program. He has no barriers and has a good support system.  He is wanting to QUIT smoking and has tried several times.  Hopefully we will be able to help him be  successful with his desire to quit. His wife passed away last fall.  He does have a puuppy living with him.    Expected Outcomes  STG: Kwamaine is able to attend program routinely. LTG Cyler will discharge with the tools and resources to help him fight his heart disease    Continue Psychosocial Services   Follow up required by staff       Psychosocial Re-Evaluation:   Psychosocial Discharge (Final Psychosocial Re-Evaluation):   Vocational Rehabilitation: Provide vocational rehab assistance to qualifying candidates.   Vocational Rehab Evaluation & Intervention: Vocational Rehab - 01/30/20 1427      Initial Vocational Rehab Evaluation & Intervention   Assessment shows need for Vocational Rehabilitation  No       Education: Education Goals: Education classes will be provided on a variety of topics geared toward better understanding of heart health and risk factor modification. Participant will state understanding/return demonstration of topics presented as noted by education test scores.  Learning Barriers/Preferences: Learning Barriers/Preferences - 01/30/20 1427      Learning Barriers/Preferences   Learning Barriers  None    Learning Preferences  None       General Cardiac Education Topics:  AED/CPR: - Group verbal and written instruction with the use of models to demonstrate the basic use of the AED with the basic ABC's of resuscitation.   Anatomy & Physiology of the Heart: - Group verbal and written instruction and models provide basic cardiac anatomy and physiology, with  the coronary electrical and arterial systems. Review of Valvular disease and Heart Failure   Cardiac Procedures: - Group verbal and written instruction to review commonly prescribed medications for heart disease. Reviews the medication, class of the drug, and side effects. Includes the steps to properly store meds and maintain the prescription regimen. (beta blockers and nitrates)   Cardiac  Medications I: - Group verbal and written instruction to review commonly prescribed medications for heart disease. Reviews the medication, class of the drug, and side effects. Includes the steps to properly store meds and maintain the prescription regimen.   Cardiac Medications II: -Group verbal and written instruction to review commonly prescribed medications for heart disease. Reviews the medication, class of the drug, and side effects. (all other drug classes)    Go Sex-Intimacy & Heart Disease, Get SMART - Goal Setting: - Group verbal and written instruction through game format to discuss heart disease and the return to sexual intimacy. Provides group verbal and written material to discuss and apply goal setting through the application of the S.M.A.R.T. Method.   Other Matters of the Heart: - Provides group verbal, written materials and models to describe Stable Angina and Peripheral Artery. Includes description of the disease process and treatment options available to the cardiac patient.   Infection Prevention: - Provides verbal and written material to individual with discussion of infection control including proper hand washing and proper equipment cleaning during exercise session.   Cardiac Rehab from 01/31/2020 in [facility] Cardiac and Pulmonary Rehab  Date  01/31/20  Educator  AS  Instruction Review Code  1- Verbalizes Understanding      Falls Prevention: - Provides verbal and written material to individual with discussion of falls prevention and safety.   Cardiac Rehab from 01/31/2020 in [facility] Cardiac and Pulmonary Rehab  Date  01/31/20  Educator  AS  Instruction Review Code  1- Verbalizes Understanding      Other: -Provides group and verbal instruction on various topics (see comments)   Knowledge Questionnaire Score: Knowledge Questionnaire Score - 01/31/20 1454      Knowledge Questionnaire Score   Pre Score  23/26       Core Components/Risk Factors/Patient Goals at  Admission: Personal Goals and Risk Factors at Admission - 01/31/20 1454      Core Components/Risk Factors/Patient Goals on Admission    Weight Management  Yes;Weight Maintenance    Admit Weight  166 lb 12.8 oz (75.7 kg)    Goal Weight: Short Term  165 lb (74.8 kg)    Goal Weight: Long Term  165 lb (74.8 kg)    Expected Outcomes  Short Term: Continue to assess and modify interventions until short term weight is achieved;Weight Maintenance: Understanding of the daily nutrition guidelines, which includes 25-35% calories from fat, 7% or less cal from saturated fats, less than 221m cholesterol, less than 1.5gm of sodium, & 5 or more servings of fruits and vegetables daily;Long Term: Adherence to nutrition and physical activity/exercise program aimed toward attainment of established weight goal    Intervention  Assist the participant in steps to quit. Provide individualized education and counseling about committing to Tobacco Cessation, relapse prevention, and pharmacological support that can be provided by physician.;OAdvice worker assist with locating and accessing local/national Quit Smoking programs, and support quit date choice.    Expected Outcomes  Short Term: Will demonstrate readiness to quit, by selecting a quit date.;Short Term: Will quit all tobacco product use, adhering to prevention of relapse plan.;Long Term: Complete abstinence  from all tobacco products for at least 12 months from quit date.    Intervention  Provide education on lifestyle modifcations including regular physical activity/exercise, weight management, moderate sodium restriction and increased consumption of fresh fruit, vegetables, and low fat dairy, alcohol moderation, and smoking cessation.;Monitor prescription use compliance.    Expected Outcomes  Short Term: Continued assessment and intervention until BP is < 140/38m HG in hypertensive participants. < 130/833mHG in hypertensive participants with diabetes,  heart failure or chronic kidney disease.;Long Term: Maintenance of blood pressure at goal levels.    Intervention  Provide education and support for participant on nutrition & aerobic/resistive exercise along with prescribed medications to achieve LDL <7019mHDL >52m24m  Expected Outcomes  Short Term: Participant states understanding of desired cholesterol values and is compliant with medications prescribed. Participant is following exercise prescription and nutrition guidelines.;Long Term: Cholesterol controlled with medications as prescribed, with individualized exercise RX and with personalized nutrition plan. Value goals: LDL < 70mg88mL > 40 mg.       Education:Diabetes - Individual verbal and written instruction to review signs/symptoms of diabetes, desired ranges of glucose level fasting, after meals and with exercise. Acknowledge that pre and post exercise glucose checks will be done for 3 sessions at entry of program.   Education: Know Your Numbers and Risk Factors: -Group verbal and written instruction about important numbers in your health.  Discussion of what are risk factors and how they play a role in the disease process.  Review of Cholesterol, Blood Pressure, Diabetes, and BMI and the role they play in your overall health.   Core Components/Risk Factors/Patient Goals Review:  Goals and Risk Factor Review    Row Name 03/12/20 1226             Core Components/Risk Factors/Patient Goals Review   Personal Goals Review  Tobacco Cessation       Review  Today ArthuBened he hopes to have a Quit date of May 1       Expected Outcomes  STG: ArthuBlissble to meet Quit date goal  LTG: ArthuGraylonans tobacco free once he has a Quit date          Core Components/Risk Factors/Patient Goals at Discharge (Final Review):  Goals and Risk Factor Review - 03/12/20 1226      Core Components/Risk Factors/Patient Goals Review   Personal Goals Review  Tobacco Cessation    Review  Today  ArthuAlvieed he hopes to have a Quit date of May 1    Expected Outcomes  STG: ArthuQuanble to meet Quit date goal  LTG: ArthuMozellans tobacco free once he has a Quit date       ITP Comments: ITP Comments    Row Name 01/30/20 1439 02/09/20 1547 02/21/20 1027 02/28/20 1425 03/12/20 1224   ITP Comments  Virtual Orientation completed today Has appointment for EP Eval and Gym Orientation on 3/3. [facility]ation of diagnosis [facility] found [facility]  Completed Initial RD Eval  30 day chart review completed. ITP sent to Dr M MilZachery Dakinscal Director, for review,changes as needed and signature.  New to program  Called to check on pt.  Unable to leave message on voicemail for mobile number. Tried home phone, left message.  First full day of exercise!  Patient was oriented to gym and equipment including functions, settings, policies, and procedures.  Patient's individual exercise prescription and treatment plan were reviewed.  All starting workloads were  established based on the results of the 6 minute walk test done at initial orientation visit.  The plan for exercise progression was also introduced and progression will be customized based on patient's performance and goals.   [place] Name 03/20/20 5427 04/08/20 0920 04/17/20 0725 04/17/20 0925     ITP Comments  30 Day review completed. Medical Director review done, changes made as directed,and approval shown by signature of Market researcher.  New to program  Art has been out due to switching insurances.  Called to check on insurance status.  Art has an appointment today to find out about getting coverage.  30 Day review completed. ITP reviewed,changes made as needed and signed by program Medical Director  Called to check on pt for insurance.  Last spoke he was meeting to discuss with someone.  Left message.       Comments: Discharge ITP

## 2020-07-23 ENCOUNTER — Other Ambulatory Visit: Payer: Self-pay | Admitting: Internal Medicine

## 2020-08-24 ENCOUNTER — Other Ambulatory Visit: Payer: Self-pay | Admitting: Internal Medicine

## 2020-12-30 ENCOUNTER — Other Ambulatory Visit: Payer: Self-pay | Admitting: Internal Medicine

## 2021-01-15 ENCOUNTER — Encounter: Payer: Self-pay | Admitting: Internal Medicine

## 2021-01-15 ENCOUNTER — Other Ambulatory Visit: Payer: Self-pay

## 2021-01-15 ENCOUNTER — Ambulatory Visit (INDEPENDENT_AMBULATORY_CARE_PROVIDER_SITE_OTHER): Payer: 59 | Admitting: Internal Medicine

## 2021-01-15 VITALS — BP 104/78 | HR 76 | Temp 97.9°F | Ht 66.0 in | Wt 185.0 lb

## 2021-01-15 DIAGNOSIS — Z125 Encounter for screening for malignant neoplasm of prostate: Secondary | ICD-10-CM | POA: Diagnosis not present

## 2021-01-15 DIAGNOSIS — Z0000 Encounter for general adult medical examination without abnormal findings: Secondary | ICD-10-CM | POA: Diagnosis not present

## 2021-01-15 DIAGNOSIS — M7552 Bursitis of left shoulder: Secondary | ICD-10-CM | POA: Diagnosis not present

## 2021-01-15 DIAGNOSIS — J449 Chronic obstructive pulmonary disease, unspecified: Secondary | ICD-10-CM

## 2021-01-15 DIAGNOSIS — Z23 Encounter for immunization: Secondary | ICD-10-CM | POA: Diagnosis not present

## 2021-01-15 DIAGNOSIS — I2510 Atherosclerotic heart disease of native coronary artery without angina pectoris: Secondary | ICD-10-CM | POA: Diagnosis not present

## 2021-01-15 LAB — LIPID PANEL
Cholesterol: 99 mg/dL (ref 0–200)
HDL: 32.5 mg/dL — ABNORMAL LOW (ref >39.00)
LDL Cholesterol: 35 mg/dL (ref 0–99)
NonHDL: 66.18
Total CHOL/HDL Ratio: 3
Triglycerides: 158 mg/dL — ABNORMAL HIGH (ref 0.0–149.0)
VLDL: 31.6 mg/dL (ref 0.0–40.0)

## 2021-01-15 LAB — COMPREHENSIVE METABOLIC PANEL
ALT: 25 U/L (ref 0–53)
AST: 20 U/L (ref 0–37)
Albumin: 4.2 g/dL (ref 3.5–5.2)
Alkaline Phosphatase: 79 U/L (ref 39–117)
BUN: 16 mg/dL (ref 6–23)
CO2: 32 mEq/L (ref 19–32)
Calcium: 9.9 mg/dL (ref 8.4–10.5)
Chloride: 105 mEq/L (ref 96–112)
Creatinine, Ser: 1.11 mg/dL (ref 0.40–1.50)
GFR: 71.24 mL/min (ref >60.00)
Glucose, Bld: 96 mg/dL (ref 70–99)
Potassium: 4.6 mEq/L (ref 3.5–5.1)
Sodium: 144 mEq/L (ref 135–145)
Total Bilirubin: 0.6 mg/dL (ref 0.2–1.2)
Total Protein: 7.5 g/dL (ref 6.0–8.3)

## 2021-01-15 LAB — CBC
HCT: 45.2 % (ref 39.0–52.0)
Hemoglobin: 15.2 g/dL (ref 13.0–17.0)
MCHC: 33.7 g/dL (ref 30.0–36.0)
MCV: 94.4 fl (ref 78.0–100.0)
Platelets: 195 10*3/uL (ref 150.0–400.0)
RBC: 4.79 Mil/uL (ref 4.22–5.81)
RDW: 13.3 % (ref 11.5–15.5)
WBC: 8 10*3/uL (ref 4.0–10.5)

## 2021-01-15 LAB — PSA: PSA: 2.82 ng/mL (ref 0.10–4.00)

## 2021-01-15 MED ORDER — SULFACETAMIDE SODIUM 10 % OP SOLN: 2.0000 [drp] | Freq: Four times a day (QID) | OPHTHALMIC | 0 refills | Status: DC

## 2021-01-15 NOTE — Addendum Note (Signed)
Addended by: Pilar Grammes on: 01/15/2021 10:48 AM   Modules accepted: Orders

## 2021-01-15 NOTE — Assessment & Plan Note (Signed)
No symptoms now On statin, brilinta still, lisinopril, metoprolol

## 2021-01-15 NOTE — Progress Notes (Signed)
Subjective:    Patient ID: Kurt [name], male    DOB: 1956-06-26, 65 y.o.   MRN: [id number]  HPI Here for physical This visit occurred during the SARS-CoV-2 public health emergency.  Safety protocols were in place, including screening questions prior to the visit, additional usage of staff PPE, and extensive cleaning of exam room while observing appropriate contact time as indicated for disinfecting solutions.   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 albuterol (VENTOLIN HFA) 108 (90 Base) MCG/ACT inhaler SMARTSIG:2 Puff(s) By Mouth Every 4 Hours PRN    . aspirin 81 MG EC tablet Take 1 tablet by mouth daily at 2 PM.    . folic acid (FOLVITE) 1 MG tablet Take 1 tablet by mouth once daily 90 tablet 0  . lisinopril (ZESTRIL) 5 MG tablet Take 1 tablet by mouth once daily 90 tablet 0  . magnesium oxide (MAG-OX) 400 (241.3 Mg) MG tablet Take 1 tablet (400 mg total) by mouth daily. 30 tablet 0  . metoprolol tartrate (LOPRESSOR) 50 MG tablet Take 1 tablet by mouth twice daily 60 tablet 4  . Multiple Vitamin (MULTIVITAMIN WITH MINERALS) TABS tablet Take 1 tablet by mouth daily. 30 tablet 0  . rosuvastatin (CRESTOR) 40 MG tablet Take 1 tablet (40 mg total) by mouth daily at 6 PM. 90 tablet 3  . ticagrelor (BRILINTA) 90 MG TABS tablet Take 1 tablet (90 mg total) by mouth 2 (two) times daily. 60 tablet 0   No  current facility-administered medications on file prior to visit.    No Known Allergies  Past Medical History:  Diagnosis Date  . COPD (chronic obstructive pulmonary disease) ([place])   . ED (erectile dysfunction)   . Hypertension   . Lumbar disc disease     Past Surgical History:  Procedure Laterality Date  . APPENDECTOMY    . CORONARY STENT INTERVENTION N/A 11/23/2019   Procedure: CORONARY STENT INTERVENTION;  Surgeon: Yolonda Kida, MD;  Location: [place] [facility];  Service: Cardiovascular;  Laterality: N/A;  RCA  . CORONARY/GRAFT ACUTE MI REVASCULARIZATION N/A 11/23/2019   Procedure: Coronary/Graft Acute MI Revascularization;  Surgeon: Yolonda Kida, MD;  Location: [place] [facility];  Service: Cardiovascular;  Laterality: N/A;  . LEFT HEART CATH AND CORONARY ANGIOGRAPHY N/A 11/23/2019   Procedure: LEFT HEART CATH AND CORONARY ANGIOGRAPHY;  Surgeon: Yolonda Kida, MD;  Location: [place] [facility];  Service: Cardiovascular;  Laterality: N/A;    Family History  Problem Relation Age of Onset  . Hyperlipidemia Father   . Cancer Father   . Heart disease Father        heart valve disease (from Agent Orange?)  . Alcohol abuse Mother   . Hypertension Neg Hx   . Diabetes Neg Hx   . Colon cancer Neg Hx   . Colon polyps Neg  Hx   . Pancreatic cancer Neg Hx   . Rectal cancer Neg Hx   . Stomach cancer Neg Hx     Social History   Socioeconomic History  . Marital status: Widowed    Spouse name: Not on file  . Number of children: 1  . Years of education: Not on file  . Highest education level: Not on file  Occupational History  . 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 Smoking status: Current Every Day Smoker    Packs/day: 0.50    Years: 45.00    Pack years: 22.50  . Smokeless tobacco: Never Used  Vaping Use  . Vaping Use: Former  . Quit date: 11/30/2016  Substance and Sexual Activity  . Alcohol use: Yes  .  Drug use: Yes    Types: Marijuana  . Sexual activity: Not on file  Other Topics Concern  . Not on file  Social History Narrative   Widowed 2020   Social Determinants of Health   Financial Resource Strain: Not on file  Food Insecurity: Not on file  Transportation Needs: Not on file  Physical Activity: Not on file  Stress: Not on file  Social Connections: Not on file  Intimate Partner Violence: Not on file   Review of Systems  Constitutional: Negative for fatigue.       Gained back the weight you lost Wears seat belt  HENT: Negative for hearing loss.        Not many teeth  Eyes: Positive for visual disturbance.       Has blurred vision on right---needs cataract done  Respiratory: Positive for cough. Negative for chest tightness.        Easy DOE--stable  Cardiovascular: Negative for chest pain, palpitations and leg swelling.  Gastrointestinal: Negative for blood in stool and constipation.       No heartburn  Endocrine: Negative for polydipsia and polyuria.  Genitourinary: Negative for difficulty urinating and urgency.       No sex for now--counseling done  Musculoskeletal: Positive for arthralgias. Negative for joint swelling.       Chronic back issues  Skin: Negative for rash.  Allergic/Immunologic: Negative for environmental allergies and immunocompromised state.  Neurological: Negative for dizziness, syncope, light-headedness and headaches.  Hematological: Negative for adenopathy. Bruises/bleeds easily.  Psychiatric/Behavioral: Negative for dysphoric mood. The patient is not nervous/anxious.        Persistent sleep problems since MI--night awakening but does get back to sleep       Objective:   Physical Exam Constitutional:      Appearance: Normal appearance.  HENT:     Right Ear: Ear canal and external ear normal.     Left Ear: Tympanic membrane, ear canal and external ear normal.     Ears:     Comments: Some scarring of left TM but no acute inflammation     Mouth/Throat:     Pharynx: No oropharyngeal exudate or posterior oropharyngeal erythema.  Eyes:     Pupils: Pupils are equal, round, and reactive to light.     Comments: Left conjunctival injection--no discharge (will try empiric antibiotic)  Cardiovascular:     Rate and Rhythm: Normal rate and regular rhythm.     Pulses: Normal pulses.     Heart sounds: No murmur heard. No gallop.   Pulmonary:     Effort: Pulmonary effort is normal.     Breath sounds: No rales.     Comments: Decreased breath  sounds Audible wheezing Abdominal:     Palpations: Abdomen is soft.     Tenderness: There is no abdominal tenderness.  Musculoskeletal:     Cervical back: Neck supple.     Right lower leg: No edema.     Left lower leg: No edema.     Comments: Tenderness over subacromial bursa on left and pain with external rotation  Lymphadenopathy:     Cervical: No cervical adenopathy.  Skin:    General: Skin is warm.     Findings: No rash.     Comments: Small scattered ecchymotic areas  Neurological:     General: No focal deficit present.     Mental Status: He is alert and oriented to person, place, and time.  Psychiatric:        Mood and Affect: Mood normal.        Behavior: Behavior normal.            Assessment & Plan:

## 2021-01-15 NOTE — Assessment & Plan Note (Signed)
Needs to stop smoking Permanently disabled now Needs COVID booster Consider shingrix--he will take Colon due 2024 Discussed PSA---will check

## 2021-01-15 NOTE — Assessment & Plan Note (Signed)
Must stop smoking!!---did for a few weeks so plans to stop again. Will refer for lung cancer screening

## 2021-01-15 NOTE — Assessment & Plan Note (Signed)
Discussed ice, lidocaine/diclofenac topically Will do cortisone shot if persists

## 2021-01-15 NOTE — Patient Instructions (Signed)
Please try ice and over the counter lidocaine patch &/or diclofenac gel on your left shoulder. If the pain doesn't go away in a couple of weeks, I can put in a cortisone shot.

## 2021-01-27 ENCOUNTER — Telehealth: Payer: Self-pay | Admitting: *Deleted

## 2021-01-27 NOTE — Telephone Encounter (Signed)
Attempted to contact and schedule for lung screening scan. However, there is no answer or voicemail option.

## 2021-02-05 ENCOUNTER — Telehealth: Payer: Self-pay | Admitting: *Deleted

## 2021-02-05 DIAGNOSIS — F17200 Nicotine dependence, unspecified, uncomplicated: Secondary | ICD-10-CM

## 2021-02-05 DIAGNOSIS — Z122 Encounter for screening for malignant neoplasm of respiratory organs: Secondary | ICD-10-CM

## 2021-02-05 DIAGNOSIS — Z87891 Personal history of nicotine dependence: Secondary | ICD-10-CM

## 2021-02-05 NOTE — Telephone Encounter (Signed)
Received referral for initial lung cancer screening scan. Contacted patient and obtained smoking history,(current 46 pack year ) as well as answering questions related to screening process. Patient denies signs of lung cancer such as weight loss or hemoptysis. Patient denies comorbidity that would prevent curative treatment if lung cancer were found. Patient is scheduled for shared decision making visit and CT scan on 02/19/21 at 10am.

## 2021-02-13 ENCOUNTER — Other Ambulatory Visit: Payer: Self-pay | Admitting: Internal Medicine

## 2021-02-19 ENCOUNTER — Ambulatory Visit
Admission: RE | Admit: 2021-02-19 | Discharge: 2021-02-19 | Disposition: A | Discharge status: Home / Self Care | Payer: 59 | Source: Ambulatory Visit | Attending: Oncology | Admitting: Oncology

## 2021-02-19 ENCOUNTER — Inpatient Hospital Stay: Payer: 59 | Attending: Oncology | Admitting: Hospice and Palliative Medicine

## 2021-02-19 ENCOUNTER — Other Ambulatory Visit: Payer: Self-pay

## 2021-02-19 DIAGNOSIS — F17200 Nicotine dependence, unspecified, uncomplicated: Secondary | ICD-10-CM | POA: Insufficient documentation

## 2021-02-19 DIAGNOSIS — Z87891 Personal history of nicotine dependence: Secondary | ICD-10-CM | POA: Insufficient documentation

## 2021-02-19 DIAGNOSIS — Z122 Encounter for screening for malignant neoplasm of respiratory organs: Secondary | ICD-10-CM | POA: Insufficient documentation

## 2021-02-19 NOTE — Progress Notes (Signed)
Virtual Visit via Video Note  I connected with@ on 02/19/21 at@ by a video enabled telemedicine application and verified that I am speaking with the correct person using two identifiers.   I discussed the limitations of evaluation and management by telemedicine and the availability of in person appointments. The patient expressed understanding and agreed to proceed.  In accordance with CMS guidelines, patient has met eligibility criteria including age, absence of signs or symptoms of lung cancer.  Social History   Tobacco Use  . Smoking status: Current Every Day Smoker    Packs/day: 1.00    Years: 46.00    Pack years: 46.00    Types: Cigarettes  . Smokeless tobacco: Never Used  Vaping Use  . Vaping Use: Former  . Quit date: 11/30/2016  Substance Use Topics  . Alcohol use: Yes  . Drug use: Yes    Types: Marijuana      A shared decision-making session was conducted prior to the performance of CT scan. This includes one or more decision aids, includes benefits and harms of screening, follow-up diagnostic testing, over-diagnosis, false positive rate, and total radiation exposure.   Counseling on the importance of adherence to annual lung cancer LDCT screening, impact of co-morbidities, and ability or willingness to undergo diagnosis and treatment is imperative for compliance of the program.   Counseling on the importance of continued smoking cessation for former smokers; the importance of smoking cessation for current smokers, and information about tobacco cessation interventions have been given to patient including [place] and [facility] quit [place] programs.   Written order for lung cancer screening with LDCT has been given to the patient and any and all questions have been answered to the best of my abilities.    Yearly follow up will be coordinated by Burgess Estelle, Thoracic Navigator.  Time Total: 15 minutes  Visit consisted of counseling and education dealing with complex  health screening. Greater than 50%  of this time was spent counseling and coordinating care related to the above assessment and plan.  Signed by: Altha Harm, PhD, NP-C

## 2021-02-25 ENCOUNTER — Encounter: Payer: Self-pay | Admitting: *Deleted

## 2021-03-21 ENCOUNTER — Other Ambulatory Visit: Payer: Self-pay | Admitting: Internal Medicine

## 2021-04-04 ENCOUNTER — Other Ambulatory Visit: Payer: Self-pay | Admitting: Internal Medicine

## 2021-04-07 ENCOUNTER — Encounter: Payer: Self-pay | Admitting: Ophthalmology

## 2021-04-14 NOTE — Discharge Instructions (Signed)

## 2021-04-15 ENCOUNTER — Encounter: Payer: Self-pay | Admitting: Ophthalmology

## 2021-04-15 ENCOUNTER — Ambulatory Visit: Payer: 59

## 2021-04-15 ENCOUNTER — Ambulatory Visit
Admission: RE | Admit: 2021-04-15 | Discharge: 2021-04-15 | Disposition: A | Discharge status: Home / Self Care | Payer: 59 | Attending: Ophthalmology | Admitting: Ophthalmology

## 2021-04-15 ENCOUNTER — Encounter
Admission: RE | Disposition: A | Discharge status: Home / Self Care | Payer: Self-pay | Source: Home / Self Care | Attending: Ophthalmology

## 2021-04-15 ENCOUNTER — Other Ambulatory Visit: Payer: Self-pay

## 2021-04-15 ENCOUNTER — Ambulatory Visit: Payer: 59 | Admitting: Anesthesiology

## 2021-04-15 DIAGNOSIS — H2511 Age-related nuclear cataract, right eye: Secondary | ICD-10-CM | POA: Insufficient documentation

## 2021-04-15 DIAGNOSIS — Z955 Presence of coronary angioplasty implant and graft: Secondary | ICD-10-CM | POA: Insufficient documentation

## 2021-04-15 DIAGNOSIS — Z79899 Other long term (current) drug therapy: Secondary | ICD-10-CM | POA: Diagnosis not present

## 2021-04-15 DIAGNOSIS — Z7982 Long term (current) use of aspirin: Secondary | ICD-10-CM | POA: Diagnosis not present

## 2021-04-15 DIAGNOSIS — I10 Essential (primary) hypertension: Secondary | ICD-10-CM | POA: Insufficient documentation

## 2021-04-15 DIAGNOSIS — I252 Old myocardial infarction: Secondary | ICD-10-CM | POA: Insufficient documentation

## 2021-04-15 DIAGNOSIS — F17210 Nicotine dependence, cigarettes, uncomplicated: Secondary | ICD-10-CM | POA: Insufficient documentation

## 2021-04-15 HISTORY — DX: Acute myocardial infarction, unspecified: I21.9

## 2021-04-15 HISTORY — PX: CATARACT EXTRACTION W/PHACO: SHX586

## 2021-04-15 SURGERY — PHACOEMULSIFICATION, CATARACT, WITH IOL INSERTION
Anesthesia: Monitor Anesthesia Care | Site: Eye | Laterality: Right

## 2021-04-15 MED ORDER — EPINEPHRINE PF 1 MG/ML IJ SOLN
INTRAOCULAR | Status: DC | PRN
  Administered 2021-04-15: 51 mL via OPHTHALMIC

## 2021-04-15 MED ORDER — LACTATED RINGERS IV SOLN: INTRAVENOUS | Status: DC

## 2021-04-15 MED ORDER — ACETAMINOPHEN 160 MG/5ML PO SOLN: 325.0000 mg | ORAL | Status: DC | PRN

## 2021-04-15 MED ORDER — BRIMONIDINE TARTRATE-TIMOLOL 0.2-0.5 % OP SOLN
OPHTHALMIC | Status: DC | PRN
  Administered 2021-04-15: 1 [drp] via OPHTHALMIC

## 2021-04-15 MED ORDER — LIDOCAINE HCL (PF) 2 % IJ SOLN
INTRAOCULAR | Status: DC | PRN
  Administered 2021-04-15: 1 mL

## 2021-04-15 MED ORDER — FENTANYL CITRATE (PF) 100 MCG/2ML IJ SOLN
INTRAMUSCULAR | Status: DC | PRN
  Administered 2021-04-15: 50 ug via INTRAVENOUS

## 2021-04-15 MED ORDER — MIDAZOLAM HCL 2 MG/2ML IJ SOLN
INTRAMUSCULAR | Status: DC | PRN
  Administered 2021-04-15: 2 mg via INTRAVENOUS

## 2021-04-15 MED ORDER — ACETAMINOPHEN 325 MG PO TABS
325.0000 mg | ORAL_TABLET | ORAL | Status: DC | PRN
Start: 2021-04-15 — End: 2021-04-15

## 2021-04-15 MED ORDER — MOXIFLOXACIN HCL 0.5 % OP SOLN
OPHTHALMIC | Status: DC | PRN
  Administered 2021-04-15: 0.2 mL via OPHTHALMIC

## 2021-04-15 MED ORDER — TETRACAINE HCL 0.5 % OP SOLN
1.0000 [drp] | OPHTHALMIC | Status: DC | PRN
  Administered 2021-04-15 (×3): 1 [drp] via OPHTHALMIC

## 2021-04-15 MED ORDER — ARMC OPHTHALMIC DILATING DROPS
1.0000 "application " | OPHTHALMIC | Status: DC | PRN
  Administered 2021-04-15 (×3): 1 via OPHTHALMIC

## 2021-04-15 MED ORDER — NA CHONDROIT SULF-NA HYALURON 40-17 MG/ML IO SOLN
INTRAOCULAR | Status: DC | PRN
  Administered 2021-04-15: 1 mL via INTRAOCULAR

## 2021-04-15 SURGICAL SUPPLY — 16 items
CANNULA ANT/CHMB 27GA (MISCELLANEOUS) ×4 IMPLANT
GLOVE BIOGEL M 8.0 STRL (GLOVE) ×2 IMPLANT
GLOVE SURG TRIUMPH 8.0 PF LTX (GLOVE) ×2 IMPLANT
GOWN STRL REUS W/ TWL LRG LVL3 (GOWN DISPOSABLE) ×2 IMPLANT
GOWN STRL REUS W/TWL LRG LVL3 (GOWN DISPOSABLE) ×4
LENS IOL TECNIS EYHANCE 17.0 (Intraocular Lens) ×2 IMPLANT
MARKER SKIN DUAL TIP RULER LAB (MISCELLANEOUS) ×2 IMPLANT
NEEDLE FILTER BLUNT 18X 1/2SAF (NEEDLE) ×1
NEEDLE FILTER BLUNT 18X1 1/2 (NEEDLE) ×1 IMPLANT
PACK EYE AFTER SURG (MISCELLANEOUS) ×2 IMPLANT
PACK OPTHALMIC (MISCELLANEOUS) ×2 IMPLANT
PACK PORFILIO (MISCELLANEOUS) ×2 IMPLANT
SYR 3ML LL SCALE MARK (SYRINGE) ×2 IMPLANT
SYR TB 1ML LUER SLIP (SYRINGE) ×2 IMPLANT
WATER STERILE IRR 250ML POUR (IV SOLUTION) ×2 IMPLANT
WIPE NON LINTING 3.25X3.25 (MISCELLANEOUS) ×2 IMPLANT

## 2021-04-15 NOTE — Anesthesia Postprocedure Evaluation (Signed)
Anesthesia Post Note  Patient: Kurt [name]  Procedure(s) Performed: CATARACT EXTRACTION PHACO AND INTRAOCULAR LENS PLACEMENT (IOC) RIGHT (Right Eye)     Patient location during evaluation: PACU Anesthesia Type: MAC Level of consciousness: awake and alert Pain management: pain level controlled Vital Signs Assessment: post-procedure vital signs reviewed and stable Respiratory status: spontaneous breathing, nonlabored ventilation, respiratory function stable and patient connected to nasal cannula oxygen Cardiovascular status: stable and blood pressure returned to baseline Postop Assessment: no apparent nausea or vomiting Anesthetic complications: no   No complications documented.  Trecia Rogers

## 2021-04-15 NOTE — Transfer of Care (Signed)
Immediate Anesthesia Transfer of Care Note  Patient: Kurt [name]  Procedure(s) Performed: CATARACT EXTRACTION PHACO AND INTRAOCULAR LENS PLACEMENT (IOC) RIGHT (Right Eye)  Patient Location: PACU  Anesthesia Type: MAC  Level of Consciousness: awake, alert  and patient cooperative  Airway and Oxygen Therapy: Patient Spontanous Breathing and Patient connected to supplemental oxygen  Post-op Assessment: Post-op Vital signs reviewed, Patient's Cardiovascular Status Stable, Respiratory Function Stable, Patent Airway and No signs of Nausea or vomiting  Post-op Vital Signs: Reviewed and stable  Complications: No complications documented.

## 2021-04-15 NOTE — H&P (Signed)
[facility]   Primary Care Physician:  Venia Carbon, MD Ophthalmologist: Dr. George Ina  Pre-Procedure History & Physical: HPI:  Kurt [name] is a 65 y.o. male here for cataract surgery.   Past Medical History:  Diagnosis Date  . COPD (chronic obstructive pulmonary disease) ([place])   . ED (erectile dysfunction)   . Hypertension   . Lumbar disc disease   . Myocardial infarct [facility])     Past Surgical History:  Procedure Laterality Date  . APPENDECTOMY    . CORONARY STENT INTERVENTION N/A 11/23/2019   Procedure: CORONARY STENT INTERVENTION;  Surgeon: Yolonda Kida, MD;  Location: [place] [facility];  Service: Cardiovascular;  Laterality: N/A;  RCA  . CORONARY/GRAFT ACUTE MI REVASCULARIZATION N/A 11/23/2019   Procedure: Coronary/Graft Acute MI Revascularization;  Surgeon: Yolonda Kida, MD;  Location: [place] [facility];  Service: Cardiovascular;  Laterality: N/A;  . LEFT HEART CATH AND CORONARY ANGIOGRAPHY N/A 11/23/2019   Procedure: LEFT HEART CATH AND CORONARY ANGIOGRAPHY;  Surgeon: Yolonda Kida, MD;  Location: [place] [facility];  Service: Cardiovascular;  Laterality: N/A;    Prior to Admission medications   Medication Sig Start Date End Date Taking? Authorizing Provider  albuterol (VENTOLIN HFA) 108 (90 Base) MCG/ACT inhaler SMARTSIG:2 Puff(s) By Mouth Every 4 Hours PRN 12/16/20  Yes [provider]  aspirin 81 MG EC tablet Take 1 tablet by mouth daily at 2 PM.   Yes [provider]  folic acid (FOLVITE) 1 MG tablet Take 1 tablet by mouth once daily 04/04/21  Yes Viviana Simpler I, MD  lisinopril (ZESTRIL) 5 MG tablet Take 1 tablet by mouth once daily 04/04/21  Yes Viviana Simpler I, MD  magnesium oxide (MAG-OX) 400 (241.3 Mg) MG tablet Take 1 tablet (400 mg total) by mouth daily. 12/12/19  Yes Nolberto Hanlon, MD  metoprolol tartrate (LOPRESSOR) 50 MG tablet Take 1 tablet by mouth twice daily 02/13/21  Yes Venia Carbon, MD  Multiple  Vitamin (MULTIVITAMIN WITH MINERALS) TABS tablet Take 1 tablet by mouth daily. 12/12/19  Yes Nolberto Hanlon, MD  rosuvastatin (CRESTOR) 40 MG tablet TAKE 1 TABLET BY MOUTH ONCE DAILY AT  6PM 03/24/21  Yes Venia Carbon, MD  sulfacetamide (BLEPH-10) 10 % ophthalmic solution Place 2 drops into both eyes 4 (four) times daily. 01/15/21  Yes Venia Carbon, MD  ticagrelor (BRILINTA) 90 MG TABS tablet Take 1 tablet (90 mg total) by mouth 2 (two) times daily. 01/10/20  Yes Venia Carbon, MD    Allergies as of 01/31/2021  . (No Known Allergies)    Family History  Problem Relation Age of Onset  . Hyperlipidemia Father   . Cancer Father   . Heart disease Father        heart valve disease (from Agent Orange?)  . Alcohol abuse Mother   . Hypertension Neg Hx   . Diabetes Neg Hx   . Colon cancer Neg Hx   . Colon polyps Neg Hx   . Pancreatic cancer Neg Hx   . Rectal cancer Neg Hx   . Stomach cancer Neg Hx     Social History   Socioeconomic History  . Marital status: Widowed    Spouse name: Not on file  . Number of children: 1  . Years of education: Not on file  . Highest education level: Not on file  Occupational History  . 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 Smoking status:  Current Every Day Smoker    Packs/day: 1.00    Years: 46.00    Pack years: 46.00    Types: Cigarettes  . Smokeless tobacco: Never Used  Vaping Use  . Vaping Use: Former  . Quit date: 11/30/2016  Substance and Sexual Activity  . Alcohol use: Yes    Alcohol/week: 4.0 standard drinks    Types: 4 Standard drinks or equivalent per week  . Drug use: Yes    Types: Marijuana  . Sexual activity: Not on file  Other Topics Concern  . Not on file  Social History Narrative   Widowed 2020   Social Determinants of Health   Financial Resource Strain: Not on file  Food Insecurity: Not on file  Transportation Needs: Not on file  Physical Activity: Not on file  Stress:  Not on file  Social Connections: Not on file  Intimate Partner Violence: Not on file    Review of Systems: See HPI, otherwise negative ROS  Physical Exam: BP (!) 145/73   Pulse 72   Temp 98.3 F (36.8 C)   Wt 83 kg   SpO2 94%   BMI 29.54 kg/m  General:   Alert,  pleasant and cooperative in NAD Head:  Normocephalic and atraumatic. Respiratory:  Normal work of breathing. Cardiovascular:  RRR  Impression/Plan: Kurt [name] is here for cataract surgery.  Risks, benefits, limitations, and alternatives regarding cataract surgery have been reviewed with the patient.  Questions have been answered.  All parties agreeable.   Birder Robson, MD  04/15/2021, 9:28 AM

## 2021-04-15 NOTE — Anesthesia Procedure Notes (Signed)
Procedure Name: MAC Date/Time: 04/15/2021 9:36 AM Performed by: Silvana Newness, CRNA Pre-anesthesia Checklist: Patient identified, Emergency Drugs available, Suction available, Patient being monitored and Timeout performed Patient Re-evaluated:Patient Re-evaluated prior to induction Oxygen Delivery Method: Nasal cannula Placement Confirmation: positive ETCO2

## 2021-04-15 NOTE — Op Note (Signed)
PREOPERATIVE DIAGNOSIS:  Nuclear sclerotic cataract of the right eye.   POSTOPERATIVE DIAGNOSIS:  H25.89 Cataract   OPERATIVE PROCEDURE:@   SURGEON:  Birder Robson, MD.   ANESTHESIA:  Anesthesiologist: Rochel Brome, MD CRNA: Silvana Newness, CRNA  1.      Managed anesthesia care. 2.      0.46ml of Shugarcaine was instilled in the eye following the paracentesis.   COMPLICATIONS:  None.   TECHNIQUE:   Stop and chop   DESCRIPTION OF PROCEDURE:  The patient was examined and consented in the preoperative holding area where the aforementioned topical anesthesia was applied to the right eye and then brought back to the Operating Room where the right eye was prepped and draped in the usual sterile ophthalmic fashion and a lid speculum was placed. A paracentesis was created with the side port blade and the anterior chamber was filled with viscoelastic. A near clear corneal incision was performed with the steel keratome. A continuous curvilinear capsulorrhexis was performed with a cystotome followed by the capsulorrhexis forceps. Hydrodissection and hydrodelineation were carried out with BSS on a blunt cannula. The lens was removed in a stop and chop  technique and the remaining cortical material was removed with the irrigation-aspiration handpiece. The capsular bag was inflated with viscoelastic and the Technis [id number]  lens was placed in the capsular bag without complication. The remaining viscoelastic was removed from the eye with the irrigation-aspiration handpiece. The wounds were hydrated. The anterior chamber was flushed with BSS and the eye was inflated to physiologic pressure. 0.65ml of Vigamox was placed in the anterior chamber. The wounds were found to be water tight. The eye was dressed with Combigan. The patient was given protective glasses to wear throughout the day and a shield with which to sleep tonight. The patient was also given drops with which to begin a drop regimen today and will  follow-up with me in one day. Implant Name Type Inv. Item Serial No. Manufacturer Lot No. LRB No. Used Action  LENS IOL TECNIS EYHANCE 17.0 - [id number] Intraocular Lens LENS IOL TECNIS EYHANCE 17.0 [id number] JOHNSON   Right 1 Implanted   Procedure(s) with comments: CATARACT EXTRACTION PHACO AND INTRAOCULAR LENS PLACEMENT ([facility]) RIGHT (Right) - 7.82 0:42.9  Electronically signed: Birder Robson 04/15/2021 9:51 AM

## 2021-04-15 NOTE — Anesthesia Preprocedure Evaluation (Signed)
Anesthesia Evaluation  Patient identified by MRN, date of birth, ID band Patient awake    Reviewed: Allergy & Precautions, H&P , NPO status , Patient's Chart, lab work & pertinent test results, reviewed documented beta blocker date and time   Airway Mallampati: II  TM Distance: >3 FB Neck ROM: full    Dental no notable dental hx.    Pulmonary COPD, Current Smoker,    Pulmonary exam normal breath sounds clear to auscultation       Cardiovascular Exercise Tolerance: Good hypertension, + CAD and + Past MI  Normal cardiovascular exam Rhythm:regular Rate:Normal     Neuro/Psych negative neurological ROS  negative psych ROS   GI/Hepatic Neg liver ROS, GERD  ,  Endo/Other  negative endocrine ROS  Renal/GU negative Renal ROS  negative genitourinary   Musculoskeletal   Abdominal   Peds  Hematology negative hematology ROS (+)   Anesthesia Other Findings   Reproductive/Obstetrics negative OB ROS                             Anesthesia Physical Anesthesia Plan  ASA: III  Anesthesia Plan: MAC   Post-op Pain Management:    Induction:   PONV Risk Score and Plan:   Airway Management Planned:   Additional Equipment:   Intra-op Plan:   Post-operative Plan:   Informed Consent: I have reviewed the patients History and Physical, chart, labs and discussed the procedure including the risks, benefits and alternatives for the proposed anesthesia with the patient or authorized representative who has indicated his/her understanding and acceptance.     Dental Advisory Given  Plan Discussed with: CRNA and Anesthesiologist  Anesthesia Plan Comments:         Anesthesia Quick Evaluation

## 2021-04-16 ENCOUNTER — Encounter: Payer: Self-pay | Admitting: Ophthalmology

## 2021-04-24 ENCOUNTER — Ambulatory Visit (INDEPENDENT_AMBULATORY_CARE_PROVIDER_SITE_OTHER): Payer: 59

## 2021-04-24 ENCOUNTER — Other Ambulatory Visit: Payer: Self-pay

## 2021-04-24 DIAGNOSIS — Z23 Encounter for immunization: Secondary | ICD-10-CM

## 2021-06-12 ENCOUNTER — Telehealth: Payer: Self-pay

## 2021-06-12 NOTE — Telephone Encounter (Signed)
Reviewed the information that patient dropped off. It was an email the patient had received from [facility] stating they had not received the records that DDS had requested. I refaxed the request over to medical records and gave patient the phone number for medical records so he can check the status. Pt verbalized understanding and thanked me for reaching back out to him.

## 2021-06-12 NOTE — Telephone Encounter (Signed)
New message    Patient at front desk with paperwork for MD to review - Disability Determination Services   Paperwork place in MD box

## 2021-06-12 NOTE — Telephone Encounter (Signed)
This is in regards to requesting medical records. I have given the letter to Donzetta Matters, Chartered certified accountant.

## 2021-10-02 ENCOUNTER — Telehealth: Payer: Self-pay

## 2021-10-02 NOTE — Telephone Encounter (Signed)
[facility] - Client Nonclinical Telephone Record  [facility] Client [facility] - Client Client Site [place] Physician Viviana Simpler- MD Contact Type Call Who Is Calling Physician / Provider / Hospital Call Type Provider Call Message Only Reason for Call Request to send message to Office Initial Comment Caller is verifying a doctor's address. Caller needs to verify medical records for a patient. Kurt [name] 1956-10-29 Kurt [name] at [facility] - [phone] Additional Comment Caller needs medical records of patient Kurt [name] dob [birth year]. Please contact Kurt [name] at [facility] - [phone] Disp. Time Disposition Final User 10/01/2021 5:25:31 PM General Information Provided Yes King[place]Hussey, Berdi Call Closed By: Bonnita Nasuti Transaction Date/Time: 10/01/2021 5:20:19 PM (ET

## 2021-10-02 NOTE — Telephone Encounter (Signed)
LM with Medical records #

## 2021-11-11 ENCOUNTER — Telehealth: Payer: Self-pay | Admitting: Internal Medicine

## 2021-11-11 NOTE — Telephone Encounter (Signed)
Patient dropped off paper from [employer]/for disability. Insurance company asking for office notes. Paper is up front in Dr Alla German mail box.

## 2021-11-11 NOTE — Telephone Encounter (Signed)
Looks like this was requested 10-02-21 and nothing was done. I have faxed the OV Note and labs from 01-15-21 to [phone]

## 2022-01-16 ENCOUNTER — Encounter: Payer: 59 | Admitting: Internal Medicine

## 2022-01-22 ENCOUNTER — Other Ambulatory Visit: Payer: Self-pay

## 2022-01-22 ENCOUNTER — Encounter: Payer: Self-pay | Admitting: Internal Medicine

## 2022-01-22 ENCOUNTER — Ambulatory Visit (INDEPENDENT_AMBULATORY_CARE_PROVIDER_SITE_OTHER): Payer: 59 | Admitting: Internal Medicine

## 2022-01-22 VITALS — BP 110/70 | HR 74 | Temp 97.9°F | Ht 66.5 in | Wt 197.0 lb

## 2022-01-22 DIAGNOSIS — F17210 Nicotine dependence, cigarettes, uncomplicated: Secondary | ICD-10-CM | POA: Insufficient documentation

## 2022-01-22 DIAGNOSIS — M1990 Unspecified osteoarthritis, unspecified site: Secondary | ICD-10-CM | POA: Insufficient documentation

## 2022-01-22 DIAGNOSIS — R200 Anesthesia of skin: Secondary | ICD-10-CM

## 2022-01-22 DIAGNOSIS — Z0000 Encounter for general adult medical examination without abnormal findings: Secondary | ICD-10-CM | POA: Diagnosis not present

## 2022-01-22 DIAGNOSIS — M159 Polyosteoarthritis, unspecified: Secondary | ICD-10-CM

## 2022-01-22 DIAGNOSIS — J449 Chronic obstructive pulmonary disease, unspecified: Secondary | ICD-10-CM | POA: Diagnosis not present

## 2022-01-22 DIAGNOSIS — I2510 Atherosclerotic heart disease of native coronary artery without angina pectoris: Secondary | ICD-10-CM | POA: Diagnosis not present

## 2022-01-22 MED ORDER — ALBUTEROL SULFATE HFA 108 (90 BASE) MCG/ACT IN AERS: INHALATION_SPRAY | RESPIRATORY_TRACT | 2 refills | Status: DC

## 2022-01-22 MED ORDER — TIOTROPIUM BROMIDE MONOHYDRATE 18 MCG IN CAPS: 18.0000 ug | ORAL_CAPSULE | Freq: Every day | RESPIRATORY_TRACT | 12 refills | Status: DC

## 2022-01-22 NOTE — Patient Instructions (Signed)
I would recommend 2 extra strength tylenol at bedtime---and using dicofenac gel (over the counter) for your shoulders.

## 2022-01-22 NOTE — Assessment & Plan Note (Signed)
Not really motivated to stop---discussed Will do lung cancer screening

## 2022-01-22 NOTE — Progress Notes (Signed)
Subjective:    Patient ID: Kurt [name], male    DOB: January 14, 1956, 66 y.o.   MRN: [id number]  HPI Here for physical Pursuing SSI disability  Ongoing shoulder problems---limited mobility Affects his sleep---will wake him up on either side Hasn't tried sleeping in recliner Did have past shots in shoulders Tylenol helps slightly  Has pain in toes--throbbing Not as noticeable when walking (unless on feet for a while) No numbness Only wears very old shoes  Gets sharp pain down right leg at night at times Can wake him up Feels like a nerve  Still smoking  No drugs Drinking a little--controlled  Current Outpatient Medications on File Prior to Visit  Medication Sig Dispense Refill   albuterol (VENTOLIN HFA) 108 (90 Base) MCG/ACT inhaler SMARTSIG:2 Puff(s) By Mouth Every 4 Hours PRN     aspirin 81 MG EC tablet Take 1 tablet by mouth daily at 2 PM.     folic acid (FOLVITE) 1 MG tablet Take 1 tablet by mouth once daily 90 tablet 3   lisinopril (ZESTRIL) 5 MG tablet Take 1 tablet by mouth once daily 90 tablet 3   magnesium oxide (MAG-OX) 400 (241.3 Mg) MG tablet Take 1 tablet (400 mg total) by mouth daily. 30 tablet 0   metoprolol tartrate (LOPRESSOR) 50 MG tablet Take 1 tablet by mouth twice daily 180 tablet 3   Multiple Vitamin (MULTIVITAMIN WITH MINERALS) TABS tablet Take 1 tablet by mouth daily. 30 tablet 0   rosuvastatin (CRESTOR) 40 MG tablet TAKE 1 TABLET BY MOUTH ONCE DAILY AT  6PM 90 tablet 3   No current facility-administered medications on file prior to visit.    No Known Allergies  Past Medical History:  Diagnosis Date   COPD (chronic obstructive pulmonary disease) (HCC)    ED (erectile dysfunction)    Hypertension    Lumbar disc disease    Myocardial infarct [facility])     Past Surgical History:  Procedure Laterality Date   APPENDECTOMY     CATARACT EXTRACTION W/PHACO Right 04/15/2021   Procedure: CATARACT EXTRACTION PHACO AND INTRAOCULAR LENS PLACEMENT ([place])  RIGHT;  Surgeon: Birder Robson, MD;  Location: [place];  Service: Ophthalmology;  Laterality: Right;  7.82 0:42.9   CORONARY STENT INTERVENTION N/A 11/23/2019   Procedure: CORONARY STENT INTERVENTION;  Surgeon: Yolonda Kida, MD;  Location: [place] CV LAB;  Service: Cardiovascular;  Laterality: N/A;  RCA   CORONARY/GRAFT ACUTE MI REVASCULARIZATION N/A 11/23/2019   Procedure: Coronary/Graft Acute MI Revascularization;  Surgeon: Yolonda Kida, MD;  Location: [place] CV [facility];  Service: Cardiovascular;  Laterality: N/A;   LEFT HEART CATH AND CORONARY ANGIOGRAPHY N/A 11/23/2019   Procedure: LEFT HEART CATH AND CORONARY ANGIOGRAPHY;  Surgeon: Yolonda Kida, MD;  Location: [place] [facility] LAB;  Service: Cardiovascular;  Laterality: N/A;    Family History  Problem Relation Age of Onset   Hyperlipidemia Father    Cancer Father    Heart disease Father        heart valve disease (from Agent Orange?)   Alcohol abuse Mother    Hypertension Neg Hx    Diabetes Neg Hx    Colon cancer Neg Hx    Colon polyps Neg Hx    Pancreatic cancer Neg Hx    Rectal cancer Neg Hx    Stomach cancer Neg Hx     Social History   Socioeconomic History   Marital status: Widowed    Spouse name: Not on file  Number of children: 1   Years of education: Not on file   Highest education level: Not on file  Occupational History   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  Tobacco Use   Smoking status: Every Day    Packs/day: 1.00    Years: 46.00    Pack years: 46.00    Types: Cigarettes   Smokeless tobacco: Never  Vaping Use   Vaping Use: Former   Quit date: 11/30/2016  Substance and Sexual Activity   Alcohol use: Yes    Alcohol/week: 4.0 standard drinks    Types: 4 Standard drinks or equivalent per week   Drug use: Yes    Types: Marijuana   Sexual activity: Not on file  Other Topics Concern   Not on file  Social History Narrative   Widowed  2020   Social Determinants of Health   Financial Resource Strain: Not on file  Food Insecurity: Not on file  Transportation Needs: Not on file  Physical Activity: Not on file  Stress: Not on file  Social Connections: Not on file  Intimate Partner Violence: Not on file   Review of Systems  Constitutional:        Has gained weight Wears seat belt  HENT:  Negative for hearing loss and tinnitus.        Needs to start with dentist---will need extractions and likely partials  Eyes:  Negative for visual disturbance.       Cataract surgery on right---vision is fine  Respiratory:  Positive for cough and shortness of breath. Negative for chest tightness.   Cardiovascular:  Negative for chest pain and palpitations.       Slight right ankle swelling---relates to his toe pain  Gastrointestinal:  Negative for blood in stool and constipation.       No heartburn  Endocrine: Negative for polydipsia and polyuria.  Genitourinary:  Negative for difficulty urinating and urgency.  Musculoskeletal:  Positive for arthralgias and back pain. Negative for joint swelling.  Skin:  Negative for rash.       No suspicious skin lesions  Allergic/Immunologic: Negative for environmental allergies and immunocompromised state.  Neurological:  Negative for dizziness, syncope, light-headedness and headaches.  Hematological:  Negative for adenopathy. Does not bruise/bleed easily.  Psychiatric/Behavioral:  Positive for sleep disturbance. Negative for dysphoric mood. The patient is not nervous/anxious.       Objective:   Physical Exam Constitutional:      Appearance: Normal appearance.  HENT:     Mouth/Throat:     Pharynx: No oropharyngeal exudate or posterior oropharyngeal erythema.  Eyes:     Conjunctiva/sclera: Conjunctivae normal.     Pupils: Pupils are equal, round, and reactive to light.  Cardiovascular:     Rate and Rhythm: Normal rate and regular rhythm.     Pulses: Normal pulses.     Heart sounds: No  murmur heard.   No gallop.  Pulmonary:     Effort: Pulmonary effort is normal.     Breath sounds: No rales.     Comments: Audible wheezing without stethescope--and in chest also Decreased breath sounds but not tight.  Abdominal:     Palpations: Abdomen is soft.     Tenderness: There is no abdominal tenderness.  Musculoskeletal:     Cervical back: Neck supple.     Right lower leg: No edema.     Left lower leg: No edema.  Lymphadenopathy:     Cervical: No cervical adenopathy.  Skin:  Findings: No lesion or rash.  Neurological:     General: No focal deficit present.     Mental Status: He is alert and oriented to person, place, and time.  Psychiatric:        Mood and Affect: Mood normal.        Behavior: Behavior normal.           Assessment & Plan:

## 2022-01-22 NOTE — Assessment & Plan Note (Signed)
Needs to stop smoking and start exercising Colon due 2024 Will defer PSA to next year Needs COVID booster Flu vaccine in the fall

## 2022-01-22 NOTE — Assessment & Plan Note (Signed)
Shoulders worst at night Discussed tylenol and diclofenac gel

## 2022-01-22 NOTE — Assessment & Plan Note (Signed)
No angina On metoprolol, lisinopril, ASA, rosuvastatin

## 2022-01-22 NOTE — Assessment & Plan Note (Signed)
Worsening Will try tiotropium daily Albuterol prn

## 2022-01-23 LAB — COMPREHENSIVE METABOLIC PANEL
ALT: 22 U/L (ref 0–53)
AST: 20 U/L (ref 0–37)
Albumin: 4.3 g/dL (ref 3.5–5.2)
Alkaline Phosphatase: 75 U/L (ref 39–117)
BUN: 20 mg/dL (ref 6–23)
CO2: 34 mEq/L — ABNORMAL HIGH (ref 19–32)
Calcium: 9.5 mg/dL (ref 8.4–10.5)
Chloride: 106 mEq/L (ref 96–112)
Creatinine, Ser: 1.22 mg/dL (ref 0.40–1.50)
GFR: 63.15 mL/min (ref >60.00)
Glucose, Bld: 91 mg/dL (ref 70–99)
Potassium: 5.1 mEq/L (ref 3.5–5.1)
Sodium: 142 mEq/L (ref 135–145)
Total Bilirubin: 0.5 mg/dL (ref 0.2–1.2)
Total Protein: 7.2 g/dL (ref 6.0–8.3)

## 2022-01-23 LAB — CBC
HCT: 45.6 % (ref 39.0–52.0)
Hemoglobin: 15.2 g/dL (ref 13.0–17.0)
MCHC: 33.2 g/dL (ref 30.0–36.0)
MCV: 96.3 fl (ref 78.0–100.0)
Platelets: 210 10*3/uL (ref 150.0–400.0)
RBC: 4.73 Mil/uL (ref 4.22–5.81)
RDW: 13.6 % (ref 11.5–15.5)
WBC: 8.3 10*3/uL (ref 4.0–10.5)

## 2022-01-23 LAB — LIPID PANEL
Cholesterol: 116 mg/dL (ref 0–200)
HDL: 33.1 mg/dL — ABNORMAL LOW (ref >39.00)
LDL Cholesterol: 49 mg/dL (ref 0–99)
NonHDL: 82.92
Total CHOL/HDL Ratio: 4
Triglycerides: 172 mg/dL — ABNORMAL HIGH (ref 0.0–149.0)
VLDL: 34.4 mg/dL (ref 0.0–40.0)

## 2022-01-23 LAB — VITAMIN B12: Vitamin B-12: 446 pg/mL (ref 211–911)

## 2022-01-23 LAB — T4, FREE: Free T4: 0.95 ng/dL (ref 0.60–1.60)

## 2022-02-15 ENCOUNTER — Emergency Department: Payer: 59

## 2022-02-15 ENCOUNTER — Other Ambulatory Visit: Payer: Self-pay

## 2022-02-15 ENCOUNTER — Observation Stay
Admission: EM | Admit: 2022-02-15 | Discharge: 2022-02-17 | Disposition: A | Discharge status: Home / Self Care | Payer: 59 | Attending: Internal Medicine | Admitting: Internal Medicine

## 2022-02-15 DIAGNOSIS — Z7982 Long term (current) use of aspirin: Secondary | ICD-10-CM | POA: Diagnosis not present

## 2022-02-15 DIAGNOSIS — Z8249 Family history of ischemic heart disease and other diseases of the circulatory system: Secondary | ICD-10-CM | POA: Insufficient documentation

## 2022-02-15 DIAGNOSIS — J9601 Acute respiratory failure with hypoxia: Secondary | ICD-10-CM | POA: Insufficient documentation

## 2022-02-15 DIAGNOSIS — Z79899 Other long term (current) drug therapy: Secondary | ICD-10-CM | POA: Insufficient documentation

## 2022-02-15 DIAGNOSIS — Z955 Presence of coronary angioplasty implant and graft: Secondary | ICD-10-CM | POA: Insufficient documentation

## 2022-02-15 DIAGNOSIS — Z20822 Contact with and (suspected) exposure to covid-19: Secondary | ICD-10-CM | POA: Diagnosis not present

## 2022-02-15 DIAGNOSIS — R197 Diarrhea, unspecified: Secondary | ICD-10-CM | POA: Diagnosis not present

## 2022-02-15 DIAGNOSIS — K219 Gastro-esophageal reflux disease without esophagitis: Secondary | ICD-10-CM | POA: Diagnosis not present

## 2022-02-15 DIAGNOSIS — R0689 Other abnormalities of breathing: Secondary | ICD-10-CM | POA: Diagnosis not present

## 2022-02-15 DIAGNOSIS — J441 Chronic obstructive pulmonary disease with (acute) exacerbation: Secondary | ICD-10-CM | POA: Diagnosis not present

## 2022-02-15 DIAGNOSIS — R0902 Hypoxemia: Secondary | ICD-10-CM | POA: Diagnosis not present

## 2022-02-15 DIAGNOSIS — F17210 Nicotine dependence, cigarettes, uncomplicated: Secondary | ICD-10-CM | POA: Diagnosis present

## 2022-02-15 DIAGNOSIS — I252 Old myocardial infarction: Secondary | ICD-10-CM

## 2022-02-15 DIAGNOSIS — I2510 Atherosclerotic heart disease of native coronary artery without angina pectoris: Secondary | ICD-10-CM | POA: Diagnosis not present

## 2022-02-15 DIAGNOSIS — I10 Essential (primary) hypertension: Secondary | ICD-10-CM | POA: Diagnosis present

## 2022-02-15 DIAGNOSIS — Z7951 Long term (current) use of inhaled steroids: Secondary | ICD-10-CM | POA: Diagnosis not present

## 2022-02-15 DIAGNOSIS — R0602 Shortness of breath: Secondary | ICD-10-CM

## 2022-02-15 DIAGNOSIS — J9600 Acute respiratory failure, unspecified whether with hypoxia or hypercapnia: Secondary | ICD-10-CM | POA: Diagnosis present

## 2022-02-15 DIAGNOSIS — Z743 Need for continuous supervision: Secondary | ICD-10-CM | POA: Diagnosis not present

## 2022-02-15 DIAGNOSIS — R000 Tachycardia, unspecified: Secondary | ICD-10-CM | POA: Diagnosis not present

## 2022-02-15 HISTORY — DX: Latent tuberculosis: Z22.7

## 2022-02-15 LAB — BLOOD GAS, ARTERIAL
Acid-Base Excess: 2.4 mmol/L — ABNORMAL HIGH (ref 0.0–2.0)
Bicarbonate: 27.2 mmol/L (ref 20.0–28.0)
O2 Content: 2 L/min
O2 Saturation: 96.6 %
Patient temperature: 37
pCO2 arterial: 42 mmHg (ref 32–48)
pH, Arterial: 7.42 (ref 7.35–7.45)
pO2, Arterial: 73 mmHg — ABNORMAL LOW (ref 83–108)

## 2022-02-15 LAB — COMPREHENSIVE METABOLIC PANEL
ALT: 20 U/L (ref 0–44)
AST: 23 U/L (ref 15–41)
Albumin: 4.1 g/dL (ref 3.5–5.0)
Alkaline Phosphatase: 71 U/L (ref 38–126)
Anion gap: 9 (ref 5–15)
BUN: 21 mg/dL (ref 8–23)
CO2: 28 mmol/L (ref 22–32)
Calcium: 9.3 mg/dL (ref 8.9–10.3)
Chloride: 107 mmol/L (ref 98–111)
Creatinine, Ser: 1.13 mg/dL (ref 0.61–1.24)
GFR, Estimated: >60 mL/min (ref >60)
Glucose, Bld: 108 mg/dL — ABNORMAL HIGH (ref 70–99)
Potassium: 4.6 mmol/L (ref 3.5–5.1)
Sodium: 144 mmol/L (ref 135–145)
Total Bilirubin: 0.6 mg/dL (ref 0.3–1.2)
Total Protein: 8.2 g/dL — ABNORMAL HIGH (ref 6.5–8.1)

## 2022-02-15 LAB — CBC WITH DIFFERENTIAL/PLATELET
Abs Immature Granulocytes: 0.05 10*3/uL (ref 0.00–0.07)
Basophils Absolute: 0.1 10*3/uL (ref 0.0–0.1)
Basophils Relative: 1 %
Eosinophils Absolute: 0.7 10*3/uL — ABNORMAL HIGH (ref 0.0–0.5)
Eosinophils Relative: 6 %
HCT: 51.3 % (ref 39.0–52.0)
Hemoglobin: 16.1 g/dL (ref 13.0–17.0)
Immature Granulocytes: 0 %
Lymphocytes Relative: 20 %
Lymphs Abs: 2.3 10*3/uL (ref 0.7–4.0)
MCH: 31.4 pg (ref 26.0–34.0)
MCHC: 31.4 g/dL (ref 30.0–36.0)
MCV: 100 fL (ref 80.0–100.0)
Monocytes Absolute: 0.9 10*3/uL (ref 0.1–1.0)
Monocytes Relative: 8 %
Neutro Abs: 7.3 10*3/uL (ref 1.7–7.7)
Neutrophils Relative %: 65 %
Platelets: 234 10*3/uL (ref 150–400)
RBC: 5.13 MIL/uL (ref 4.22–5.81)
RDW: 13.9 % (ref 11.5–15.5)
WBC: 11.3 10*3/uL — ABNORMAL HIGH (ref 4.0–10.5)
nRBC: 0 % (ref 0.0–0.2)

## 2022-02-15 LAB — RESP PANEL BY RT-PCR (FLU A&B, COVID) ARPGX2
Influenza A by PCR: NEGATIVE
Influenza B by PCR: NEGATIVE
SARS Coronavirus 2 by RT PCR: NEGATIVE

## 2022-02-15 LAB — TROPONIN I (HIGH SENSITIVITY)
Troponin I (High Sensitivity): 11 ng/L (ref <18)
Troponin I (High Sensitivity): 8 ng/L (ref <18)

## 2022-02-15 LAB — BRAIN NATRIURETIC PEPTIDE: B Natriuretic Peptide: 16.6 pg/mL (ref 0.0–100.0)

## 2022-02-15 MED ORDER — NICOTINE 21 MG/24HR TD PT24
21.0000 mg | MEDICATED_PATCH | Freq: Every day | TRANSDERMAL | Status: DC
  Administered 2022-02-15 – 2022-02-17 (×3): 21 mg via TRANSDERMAL
  Filled 2022-02-15 (×3): qty 1

## 2022-02-15 MED ORDER — SODIUM CHLORIDE 0.9 % IV SOLN: INTRAVENOUS | Status: DC

## 2022-02-15 MED ORDER — ONDANSETRON HCL 4 MG/2ML IJ SOLN: 4.0000 mg | Freq: Four times a day (QID) | INTRAMUSCULAR | Status: DC | PRN

## 2022-02-15 MED ORDER — TIOTROPIUM BROMIDE MONOHYDRATE 18 MCG IN CAPS
18.0000 ug | ORAL_CAPSULE | Freq: Every day | RESPIRATORY_TRACT | Status: DC
  Administered 2022-02-16 – 2022-02-17 (×2): 18 ug via RESPIRATORY_TRACT
  Filled 2022-02-15: qty 5

## 2022-02-15 MED ORDER — IPRATROPIUM-ALBUTEROL 0.5-2.5 (3) MG/3ML IN SOLN
3.0000 mL | Freq: Once | RESPIRATORY_TRACT | Status: AC
  Administered 2022-02-15: 3 mL via RESPIRATORY_TRACT
  Filled 2022-02-15: qty 3

## 2022-02-15 MED ORDER — ADULT MULTIVITAMIN W/MINERALS CH
1.0000 | ORAL_TABLET | Freq: Every day | ORAL | Status: DC
  Administered 2022-02-16 – 2022-02-17 (×2): 1 via ORAL
  Filled 2022-02-15 (×2): qty 1

## 2022-02-15 MED ORDER — METHYLPREDNISOLONE SODIUM SUCC 40 MG IJ SOLR
40.0000 mg | Freq: Two times a day (BID) | INTRAMUSCULAR | Status: AC
  Administered 2022-02-15 – 2022-02-16 (×2): 40 mg via INTRAVENOUS
  Filled 2022-02-15 (×3): qty 1

## 2022-02-15 MED ORDER — ONDANSETRON HCL 4 MG PO TABS: 4.0000 mg | ORAL_TABLET | Freq: Four times a day (QID) | ORAL | Status: DC | PRN

## 2022-02-15 MED ORDER — ACETAMINOPHEN 650 MG RE SUPP: 650.0000 mg | Freq: Four times a day (QID) | RECTAL | Status: DC | PRN

## 2022-02-15 MED ORDER — PREDNISONE 20 MG PO TABS
40.0000 mg | ORAL_TABLET | Freq: Every day | ORAL | Status: DC
  Administered 2022-02-16 – 2022-02-17 (×2): 40 mg via ORAL
  Filled 2022-02-15 (×2): qty 2

## 2022-02-15 MED ORDER — SODIUM CHLORIDE 0.9 % IV SOLN
1.0000 g | INTRAVENOUS | Status: DC
  Administered 2022-02-15 – 2022-02-16 (×2): 1 g via INTRAVENOUS
  Filled 2022-02-15 (×3): qty 10

## 2022-02-15 MED ORDER — ACETAMINOPHEN 325 MG PO TABS: 650.0000 mg | ORAL_TABLET | Freq: Four times a day (QID) | ORAL | Status: DC | PRN

## 2022-02-15 MED ORDER — FOLIC ACID 1 MG PO TABS
1.0000 mg | ORAL_TABLET | Freq: Every day | ORAL | Status: DC
  Administered 2022-02-16 – 2022-02-17 (×2): 1 mg via ORAL
  Filled 2022-02-15 (×2): qty 1

## 2022-02-15 MED ORDER — MOMETASONE FURO-FORMOTEROL FUM 200-5 MCG/ACT IN AERO
2.0000 | INHALATION_SPRAY | Freq: Two times a day (BID) | RESPIRATORY_TRACT | Status: DC
  Administered 2022-02-15 – 2022-02-17 (×4): 2 via RESPIRATORY_TRACT
  Filled 2022-02-15: qty 8.8

## 2022-02-15 MED ORDER — ALBUTEROL SULFATE (2.5 MG/3ML) 0.083% IN NEBU
2.5000 mg | INHALATION_SOLUTION | Freq: Four times a day (QID) | RESPIRATORY_TRACT | Status: DC | PRN
  Administered 2022-02-15: 2.5 mg via RESPIRATORY_TRACT
  Filled 2022-02-15: qty 3

## 2022-02-15 MED ORDER — ENOXAPARIN SODIUM 40 MG/0.4ML IJ SOSY
40.0000 mg | PREFILLED_SYRINGE | INTRAMUSCULAR | Status: DC
  Administered 2022-02-15 – 2022-02-16 (×2): 40 mg via SUBCUTANEOUS
  Filled 2022-02-15 (×2): qty 0.4

## 2022-02-15 MED ORDER — ROSUVASTATIN CALCIUM 10 MG PO TABS
40.0000 mg | ORAL_TABLET | Freq: Every day | ORAL | Status: DC
  Administered 2022-02-16: 40 mg via ORAL
  Filled 2022-02-15 (×2): qty 4

## 2022-02-15 MED ORDER — PANTOPRAZOLE SODIUM 40 MG PO TBEC
40.0000 mg | DELAYED_RELEASE_TABLET | Freq: Every day | ORAL | Status: DC
  Administered 2022-02-15 – 2022-02-17 (×3): 40 mg via ORAL
  Filled 2022-02-15 (×3): qty 1

## 2022-02-15 MED ORDER — MAGNESIUM OXIDE 400 (241.3 MG) MG PO TABS: 400.0000 mg | ORAL_TABLET | Freq: Every day | ORAL | Status: DC

## 2022-02-15 MED ORDER — METOPROLOL TARTRATE 50 MG PO TABS
50.0000 mg | ORAL_TABLET | Freq: Two times a day (BID) | ORAL | Status: DC
  Administered 2022-02-15 – 2022-02-17 (×4): 50 mg via ORAL
  Filled 2022-02-15 (×4): qty 1

## 2022-02-15 MED ORDER — LISINOPRIL 10 MG PO TABS
5.0000 mg | ORAL_TABLET | Freq: Every day | ORAL | Status: DC
  Administered 2022-02-16 – 2022-02-17 (×2): 5 mg via ORAL
  Filled 2022-02-15 (×2): qty 1

## 2022-02-15 MED ORDER — ASPIRIN EC 81 MG PO TBEC
81.0000 mg | DELAYED_RELEASE_TABLET | Freq: Every day | ORAL | Status: DC
  Administered 2022-02-16: 81 mg via ORAL
  Filled 2022-02-15: qty 1

## 2022-02-15 NOTE — ED Notes (Signed)
Lab called for clarification on COVID swab which was collected when this RN took blood work. Lab states it is not there, this RN will re-swab.  ?

## 2022-02-15 NOTE — Assessment & Plan Note (Signed)
Smoking cessation has been discussed with patient in detail We will place patient on a nicotine transdermal patch 21 mg daily 

## 2022-02-15 NOTE — ED Notes (Addendum)
Patient AOX4, non productive cough. Labored respirations on 6L per [place]. Attempted to wean oxygen down to 4L per [place]. Oxygen saturation 88-92 on 4L but patient reports shortness of breath worsening.  ?

## 2022-02-15 NOTE — H&P (Signed)
?History and Physical  ? ? ?Patient: Kurt [name] FGH:[id number] DOB: 12-22-1955 ?DOA: 02/15/2022 ?DOS: the patient was seen and examined on 02/15/2022 ?PCP: Venia Carbon, MD  ?Patient coming from: Home ? ?Chief Complaint:  ?Chief Complaint  ?Patient presents with  ? Shortness of Breath  ? ?HPI: Kurt [name] is a 66 y.o. male with medical history significant for COPD, nicotine dependence, hypertension, coronary artery disease status post stent angioplasty who presented to the ER for evaluation of worsening shortness of breath over the last 1 week associated with nasal congestion and diarrhea. ?Patient states that his symptoms started about a week ago with what he assumed was an upper respiratory tract infection, shortness of breath and nasal congestion 16.  He was recently started on an inhaler which he thought was causing his symptoms and so he stopped his inhaler.  Symptoms associated with loose watery stools and chills but he denies having any fever. ?He denies having any chest pain, no dizziness, no lightheadedness, no abdominal pain, no urinary symptoms, no headache, no leg swelling, no blurred vision no focal deficits ?When EMS arrived patient received 2 DuoNeb treatments and Solu-Medrol 125 mg IV. ?He was hypoxic in the field with room air pulse oximetry of 85% and is currently on 6 L of oxygen with pulse oximetry of 93%. ?He will be admitted to the hospital for further evaluation ?Review of Systems: As mentioned in the history of present illness. All other systems reviewed and are negative. ?Past Medical History:  ?Diagnosis Date  ? COPD (chronic obstructive pulmonary disease) ([place])   ? ED (erectile dysfunction)   ? Hypertension   ? Lumbar disc disease   ? Myocardial infarct [facility])   ? ?Past Surgical History:  ?Procedure Laterality Date  ? APPENDECTOMY    ? CATARACT EXTRACTION W/PHACO Right 04/15/2021  ? Procedure: CATARACT EXTRACTION PHACO AND INTRAOCULAR LENS PLACEMENT ([place]) RIGHT;  Surgeon: Birder Robson, MD;  Location: [place];  Service: Ophthalmology;  Laterality: Right;  7.82 ?0:42.9  ? CORONARY STENT INTERVENTION N/A 11/23/2019  ? Procedure: CORONARY STENT INTERVENTION;  Surgeon: Yolonda Kida, MD;  Location: [place] [facility];  Service: Cardiovascular;  Laterality: N/A;  RCA  ? CORONARY/GRAFT ACUTE MI REVASCULARIZATION N/A 11/23/2019  ? Procedure: Coronary/Graft Acute MI Revascularization;  Surgeon: Yolonda Kida, MD;  Location: [place] [facility];  Service: Cardiovascular;  Laterality: N/A;  ? LEFT HEART CATH AND CORONARY ANGIOGRAPHY N/A 11/23/2019  ? Procedure: LEFT HEART CATH AND CORONARY ANGIOGRAPHY;  Surgeon: Yolonda Kida, MD;  Location: [place] [facility];  Service: Cardiovascular;  Laterality: N/A;  ? ?Social History:  reports that he has been smoking cigarettes. He has a 46.00 pack-year smoking history. He has never used smokeless tobacco. He reports current alcohol use of about 4.0 standard drinks per week. He reports current drug use. Drug: Marijuana. ? ?No Known Allergies ? ?Family History  ?Problem Relation Age of Onset  ? Hyperlipidemia Father   ? Cancer Father   ? Heart disease Father   ?     heart valve disease (from Agent Orange?)  ? Alcohol abuse Mother   ? Hypertension Neg Hx   ? Diabetes Neg Hx   ? Colon cancer Neg Hx   ? Colon polyps Neg Hx   ? Pancreatic cancer Neg Hx   ? Rectal cancer Neg Hx   ? Stomach cancer Neg Hx   ? ? ?Prior to Admission medications   ?Medication Sig Start Date End Date  Taking? Authorizing Provider  ?albuterol (VENTOLIN HFA) 108 (90 Base) MCG/ACT inhaler SMARTSIG:2 Puff(s) By Mouth Every 4 Hours PRN 01/22/22   Venia Carbon, MD  ?aspirin 81 MG EC tablet Take 1 tablet by mouth daily at 2 PM.    [provider]  ?folic acid (FOLVITE) 1 MG tablet Take 1 tablet by mouth once daily 04/04/21   Venia Carbon, MD  ?lisinopril (ZESTRIL) 5 MG tablet Take 1 tablet by mouth once daily 04/04/21   Venia Carbon, MD   ?magnesium oxide (MAG-OX) 400 (241.3 Mg) MG tablet Take 1 tablet (400 mg total) by mouth daily. 12/12/19   Nolberto Hanlon, MD  ?metoprolol tartrate (LOPRESSOR) 50 MG tablet Take 1 tablet by mouth twice daily 02/13/21   Venia Carbon, MD  ?Multiple Vitamin (MULTIVITAMIN WITH MINERALS) TABS tablet Take 1 tablet by mouth daily. 12/12/19   Nolberto Hanlon, MD  ?rosuvastatin (CRESTOR) 40 MG tablet TAKE 1 TABLET BY MOUTH ONCE DAILY AT  [place] 03/24/21   Venia Carbon, MD  ?tiotropium (SPIRIVA) 18 MCG inhalation capsule Place 1 capsule (18 mcg total) into inhaler and inhale daily. 01/22/22   Venia Carbon, MD  ? ? ?Physical Exam: ?Vitals:  ? 02/15/22 1323 02/15/22 1324 02/15/22 1400  ?BP: (!) 134/97  138/87  ?Pulse: 78  83  ?Resp: 19  18  ?Temp: 98.1 ?F (36.7 ?C)    ?TempSrc: Oral    ?SpO2: 96%  95%  ?Weight:  89.4 kg   ?Height:  '5\' 6"'$  (1.676 m)   ?Physical Exam ?Vitals and nursing note reviewed.  ?Constitutional:   ?   Appearance: He is well-developed and normal weight.  ?HENT:  ?   Head: Normocephalic and atraumatic.  ?   Mouth/Throat:  ?   Mouth: Mucous membranes are moist.  ?Eyes:  ?   Pupils: Pupils are equal, round, and reactive to light.  ?Cardiovascular:  ?   Rate and Rhythm: Normal rate and regular rhythm.  ?Pulmonary:  ?   Effort: Tachypnea present.  ?   Breath sounds: Examination of the right-upper field reveals wheezing. Examination of the left-upper field reveals wheezing. Examination of the right-middle field reveals wheezing. Examination of the left-middle field reveals wheezing. Examination of the right-lower field reveals wheezing. Examination of the left-lower field reveals wheezing. Wheezing present.  ?Abdominal:  ?   General: Bowel sounds are normal.  ?   Palpations: Abdomen is soft.  ?Musculoskeletal:     ?   General: Normal range of motion.  ?   Cervical back: Normal range of motion and neck supple.  ?Skin: ?   General: Skin is warm and dry.  ?Neurological:  ?   General: No focal deficit present.  ?    Mental Status: He is alert and oriented to person, place, and time.  ?Psychiatric:     ?   Mood and Affect: Mood normal.     ?   Behavior: Behavior normal.  ? ? ? ?Data Reviewed: ?Relevant notes from primary care and specialist visits, past discharge summaries as available in EHR, including Care Everywhere. ?Prior diagnostic testing as pertinent to current admission diagnoses ?Updated medications and problem lists for reconciliation ?ED course, including vitals, labs, imaging, treatment and response to treatment ?Triage notes, nursing and pharmacy notes and ED provider's notes ?Notable results as noted in HPI ?Labs reviewed and essentially negative except for a white count of 11.8.  BMP 16.6 ?Chest x-ray reviewed by me shows hyperinflated lung fields. ?Twelve-lead  EKG reviewed by me shows sinus rhythm with anteroseptal infarct ?There are no new results to review at this time. ? ?Assessment and Plan: ?* COPD with acute exacerbation ([place]) ?Patient presents for evaluation of worsening shortness of breath associated with a cough productive of yellow phlegm and wheezing ?Place patient on scheduled and as needed bronchodilator therapy, inhaled steroids as well as systemic steroids ?Supportive care with antitussives ?Place patient on IV Rocephin ? ?Acute respiratory failure ([place]) ?Secondary to acute COPD exacerbation ?Patient presents for evaluation of worsening shortness of breath associated with a cough productive of yellow phlegm and wheezing ?He had room air pulse oximetry of 85% associated with increased work of breathing and is currently on 6 L of oxygen ?Obtain ABG ?We will attempt to wean oxygen as tolerated ?Patient will need to be assessed for home oxygen prior to discharge ? ?Cigarette smoker ?Smoking cessation has been discussed with patient in detail ?We will place patient on a nicotine transdermal patch 21 mg daily ? ?Coronary atherosclerosis of native coronary artery ?Patient has a history of known coronary  artery disease and is status post stent angioplasty ?Continue aspirin, metoprolol and statins ? ?GERD (gastroesophageal reflux disease) ?Stable ?Continue PPI ? ?Hypertension ?Blood pressure is stable ?Contin

## 2022-02-15 NOTE — Assessment & Plan Note (Signed)
Stable.  Continue PPI. 

## 2022-02-15 NOTE — Assessment & Plan Note (Addendum)
Secondary to acute COPD exacerbation ?Patient presents for evaluation of worsening shortness of breath associated with a cough productive of yellow phlegm and wheezing ?He had room air pulse oximetry of 85% associated with increased work of breathing and is currently on 6 L of oxygen ?Obtain ABG ?We will attempt to wean oxygen as tolerated ?Patient will need to be assessed for home oxygen prior to discharge ?

## 2022-02-15 NOTE — Assessment & Plan Note (Signed)
Patient has a history of known coronary artery disease and is status post stent angioplasty ?Continue aspirin, metoprolol and statins ?

## 2022-02-15 NOTE — ED Notes (Signed)
Patient states he feels much better after albuterol breathing treatment. Placed on 6L per [place] with oxygen saturation at 94%. ? ?

## 2022-02-15 NOTE — ED Provider Notes (Signed)
? ?[facility] ?Provider Note ? ? Event Date/Time  ? First MD Initiated Contact with Patient 02/15/22 1314   ?  (approximate) ?History  ?Shortness of Breath ? ?HPI ?Kurt [name] is a 66 y.o. male with stated past medical history of COPD who presents for worsening shortness of breath over the last that he was recently started on had care at the beginning of the month but ended up developing a upper respiratory symptoms soon after with associated vomiting and stopped taking this medication.  Patient states that his ill symptoms have resolved however his shortness of breath has persisted after stopping this inhaler.  EMS during transport was given 2 DuoNeb treatments, 125 mg Solu-Medrol. Patient states that he feels much better. ?Physical Exam  ?Triage Vital Signs: ?ED Triage Vitals  ?Enc Vitals Group  ?   BP 02/15/22 1323 (!) 134/97  ?   Pulse Rate 02/15/22 1323 78  ?   Resp 02/15/22 1323 19  ?   Temp 02/15/22 1323 98.1 ?F (36.7 ?C)  ?   Temp Source 02/15/22 1323 Oral  ?   SpO2 02/15/22 1323 96 %  ?   Weight 02/15/22 1324 197 lb (89.4 kg)  ?   Height 02/15/22 1324 '5\' 6"'$  (1.676 m)  ?   Head Circumference --   ?   Peak Flow --   ?   Pain Score 02/15/22 1324 0  ?   Pain Loc --   ?   Pain Edu? --   ?   Excl. in [place]? --   ? ?Most recent vital signs: ?Vitals:  ? 02/15/22 1323 02/15/22 1400  ?BP: (!) 134/97 138/87  ?Pulse: 78 83  ?Resp: 19 18  ?Temp: 98.1 ?F (36.7 ?C)   ?SpO2: 96% 95%  ? ?General: Awake, oriented x4. ?CV:  Good peripheral perfusion.  ?Resp:  Increased effort.  Inspiratory and expiratory wheezes over bilateral lung fields.  Audible wheezing without auscultation ?Abd:  No distention.  ?Other:  Middle-aged Caucasian male laying in bed in no distress ?ED Results / Procedures / Treatments  ?Labs ?(all labs ordered are listed, but only abnormal results are displayed) ?Labs Reviewed  ?COMPREHENSIVE METABOLIC PANEL - Abnormal; Notable for the following components:  ?    Result Value  ?  Glucose, Bld 108 (*)   ? Total Protein 8.2 (*)   ? All other components within normal limits  ?CBC WITH DIFFERENTIAL/PLATELET - Abnormal; Notable for the following components:  ? WBC 11.3 (*)   ? Eosinophils Absolute 0.7 (*)   ? All other components within normal limits  ?RESP PANEL BY RT-PCR (FLU A&B, COVID) ARPGX2  ?BRAIN NATRIURETIC PEPTIDE  ?TROPONIN I (HIGH SENSITIVITY)  ? ?EKG ?ED ECG REPORT ?I, Naaman Plummer, the attending physician, personally viewed and interpreted this ECG. ?Date: 02/15/2022 ?EKG Time: 1331 ?Rate: 75 ?Rhythm: normal sinus rhythm ?QRS Axis: normal ?Intervals: normal ?ST/T Wave abnormalities: normal ?Narrative Interpretation: no evidence of acute ischemia ?RADIOLOGY ?ED MD interpretation: One-view portable chest x-ray interpreted by me shows no evidence of acute abnormalities including no pneumonia, pneumothorax, or widened mediastinum ?-Agree with radiology assessment ?Official radiology report(s): ?DG Chest Port 1 View ? ?Result Date: 02/15/2022 ?CLINICAL DATA:  Shortness of breath. EXAM: PORTABLE CHEST 1 VIEW COMPARISON:  CT chest dated February 19, 2021. Chest x-ray dated November 30, 2019. FINDINGS: The heart size and mediastinal contours are within normal limits. The lungs remain hyperinflated. No focal consolidation, pleural effusion, or pneumothorax. No acute osseous abnormality.  IMPRESSION: 1. No active disease. Electronically Signed   By: Titus Dubin M.D.   On: 02/15/2022 14:41   ?PROCEDURES: ?Critical Care performed: Yes, see critical care procedure note(s) ?.1-3 Lead EKG Interpretation ?Performed by: Naaman Plummer, MD ?Authorized by: Naaman Plummer, MD  ? ?  Interpretation: normal   ?  ECG rate:  82 ?  ECG rate assessment: normal   ?  Rhythm: sinus rhythm   ?  Ectopy: none   ?  Conduction: normal   ?CRITICAL CARE ?Performed by: Naaman Plummer ? ?Total critical care time: 31 minutes ? ?Critical care time was exclusive of separately billable procedures and treating other  patients. ? ?Critical care was necessary to treat or prevent imminent or life-threatening deterioration. ? ?Critical care was time spent personally by me on the following activities: development of treatment plan with patient and/or surrogate as well as nursing, discussions with consultants, evaluation of patient's response to treatment, examination of patient, obtaining history from patient or surrogate, ordering and performing treatments and interventions, ordering and review of laboratory studies, ordering and review of radiographic studies, pulse oximetry and re-evaluation of patient's condition. ? ?MEDICATIONS ORDERED IN ED: ?Medications  ?ipratropium-albuterol (DUONEB) 0.5-2.5 (3) MG/3ML nebulizer solution 3 mL (has no administration in time range)  ? ?IMPRESSION / MDM / ASSESSMENT AND PLAN / ED COURSE  ?I reviewed the triage vital signs and the nursing notes. ?             ?               ?The patient is on the cardiac monitor to evaluate for evidence of arrhythmia and/or significant heart rate changes. ?The patient appears to be suffering from a moderate/severe exacerbation of COPD. ? ?Based on the history, exam, CXR/EKG reviewed by me, and further workup I don?t suspect any other emergent cause of this presentation, such as pneumonia, acute coronary syndrome, congestive heart failure, pulmonary embolism, or pneumothorax. ? ?ED Interventions: bronchodilators, steroids, antibiotics, reassess ? ?Reassessment: After treatment, the patient?s shortness of breath is improving but patient is still requiring supplemental oxygenation  ? ?Disposition: Admit ?  ?FINAL CLINICAL IMPRESSION(S) / ED DIAGNOSES  ? ?Final diagnoses:  ?COPD exacerbation ([place])  ?Hypoxia  ? ?Rx / DC Orders  ? ?ED Discharge Orders   ? ? None  ? ?  ? ?Note:  This document was prepared using Dragon voice recognition software and may include unintentional dictation errors. ?  ?Naaman Plummer, MD ?02/15/22 1455 ? ?

## 2022-02-15 NOTE — ED Notes (Signed)
Pt presents to ED via [facility] with c/o of worsening SOB for the past 3 days. Pt states HX of COPD. Pt states he had stopped his Advair inhaler and states his breathing has got worse since then. Pt states he does still smoke cigarettes at this time. Pt denies any current chest pain. Pt denies fevers or chills. Pt states a few days ago he had a few episodes of diarrhea but nothing for the past 2 days. Pt denies chronic O2 use at home.  ? ?Pt presents on 8L/min via [place]. EMS gave 2 duonebs and 1 albuterol, 1 gram of Mag and 125 mg of solumedrol IVP.  ? ?Pt does have audile wheezing at this time.  ? ?Pt currently on 6L/min via [place] maintaining a O2 sat above 90%.  ?

## 2022-02-15 NOTE — Assessment & Plan Note (Addendum)
Patient presents for evaluation of worsening shortness of breath associated with a cough productive of yellow phlegm and wheezing ?Place patient on scheduled and as needed bronchodilator therapy, inhaled steroids as well as systemic steroids ?Supportive care with antitussives ?Place patient on IV Rocephin ?

## 2022-02-15 NOTE — Assessment & Plan Note (Signed)
Blood pressure is stable ?Continue lisinopril and metoprolol ?

## 2022-02-16 ENCOUNTER — Encounter: Payer: Self-pay | Admitting: Internal Medicine

## 2022-02-16 DIAGNOSIS — J441 Chronic obstructive pulmonary disease with (acute) exacerbation: Secondary | ICD-10-CM | POA: Diagnosis not present

## 2022-02-16 DIAGNOSIS — J9601 Acute respiratory failure with hypoxia: Secondary | ICD-10-CM

## 2022-02-16 LAB — CBC
HCT: 47.4 % (ref 39.0–52.0)
Hemoglobin: 15 g/dL (ref 13.0–17.0)
MCH: 31.3 pg (ref 26.0–34.0)
MCHC: 31.6 g/dL (ref 30.0–36.0)
MCV: 98.8 fL (ref 80.0–100.0)
Platelets: 207 10*3/uL (ref 150–400)
RBC: 4.8 MIL/uL (ref 4.22–5.81)
RDW: 13.6 % (ref 11.5–15.5)
WBC: 12.7 10*3/uL — ABNORMAL HIGH (ref 4.0–10.5)
nRBC: 0 % (ref 0.0–0.2)

## 2022-02-16 LAB — BASIC METABOLIC PANEL
Anion gap: 5 (ref 5–15)
BUN: 22 mg/dL (ref 8–23)
CO2: 26 mmol/L (ref 22–32)
Calcium: 8.6 mg/dL — ABNORMAL LOW (ref 8.9–10.3)
Chloride: 108 mmol/L (ref 98–111)
Creatinine, Ser: 1.06 mg/dL (ref 0.61–1.24)
GFR, Estimated: >60 mL/min (ref >60)
Glucose, Bld: 127 mg/dL — ABNORMAL HIGH (ref 70–99)
Potassium: 4.8 mmol/L (ref 3.5–5.1)
Sodium: 139 mmol/L (ref 135–145)

## 2022-02-16 LAB — HIV ANTIBODY (ROUTINE TESTING W REFLEX): HIV Screen 4th Generation wRfx: NONREACTIVE

## 2022-02-16 MED ORDER — ORAL CARE MOUTH RINSE
15.0000 mL | Freq: Two times a day (BID) | OROMUCOSAL | Status: DC
  Administered 2022-02-16 – 2022-02-17 (×2): 15 mL via OROMUCOSAL

## 2022-02-16 NOTE — TOC Initial Note (Signed)
Transition of Care (TOC) - Initial/Assessment Note  ? ? ?Transition of Care (TOC) Screening Note ? ? ?Patient Details  ?Name: Kurt [name] ?Date of Birth: 1956-05-09 ? ? ?Transition of Care (TOC) CM/SW Contact:    ?Kerin Salen, RN ?Phone Number: ?02/16/2022, 12:45 PM ? ? ? ?Transition of Care Department [facility]) has reviewed patient and no TOC needs have been identified at this time. We will continue to monitor patient advancement through interdisciplinary progression rounds. If new patient transition needs arise, please place a TOC consult. ?  ? ?  ?  ? ? ?Patient Goals and CMS Choice ?  ?  ?  ? ?Expected Discharge Plan and Services ?  ?  ?  ?  ?  ?                ?  ?  ?  ?  ?  ?  ?  ?  ?  ?  ? ?Prior Living Arrangements/Services ?  ?  ?  ?       ?  ?  ?  ?  ? ?Activities of Daily Living ?Home Assistive Devices/Equipment: Eyeglasses ?ADL Screening (condition at time of admission) ?Patient's cognitive ability adequate to safely complete daily activities?: Yes ?Is the patient deaf or have difficulty hearing?: No ?Does the patient have difficulty seeing, even when wearing glasses/contacts?: No ?Does the patient have difficulty concentrating, remembering, or making decisions?: No ?Patient able to express need for assistance with ADLs?: Yes ?Does the patient have difficulty dressing or bathing?: No ?Independently performs ADLs?: Yes (appropriate for developmental age) ?Does the patient have difficulty walking or climbing stairs?: No ?Weakness of Legs: None ?Weakness of Arms/Hands: None ? ?Permission Sought/Granted ?  ?  ?   ?   ?   ?   ? ?Emotional Assessment ?  ?  ?  ?  ?  ?  ? ?Admission diagnosis:  SOB (shortness of breath) [R06.02] ?Hypoxia [R09.02] ?COPD exacerbation ([place]) [J44.1] ?COPD with acute exacerbation ([place]) [J44.1] ?Patient Active Problem List  ? Diagnosis Date Noted  ? COPD with acute exacerbation ([place]) 02/15/2022  ? Cigarette smoker 01/22/2022  ? Osteoarthritis 01/22/2022  ? Coronary atherosclerosis of  native coronary artery 01/15/2020  ? Acute respiratory failure ([place])   ? GERD (gastroesophageal reflux disease) 11/24/2016  ? Routine general medical examination at a health care facility 06/12/2014  ? Hypertension   ? Lumbar back pain with radiculopathy affecting left lower extremity 06/10/2011  ? COPD (chronic obstructive pulmonary disease) ([place]) 09/01/2007  ? [place] DISEASE, LUMBAR 09/01/2007  ? ?PCP:  Venia Carbon, MD ?Pharmacy:   ?[facility] [address] ?[place] ?[place] [address] ?Phone: [phone] Fax: [phone] ? ? ? ? ?Social Determinants of Health (SDOH) Interventions ?  ? ?Readmission Risk Interventions ?No flowsheet data found. ? ? ?

## 2022-02-16 NOTE — Consult Note (Signed)
? ? ? ?PULMONOLOGY ? ? ? ? ? ? ? ? ?Date: 02/16/2022,   ?MRN# [id number] Kurt [name] 16-Oct-1956 ? ? ?  ?AdmissionWeight: 89.4 kg                 ?CurrentWeight: 81.6 kg ? ?Ordering provider: Dr Francine Graven ? ? ?CHIEF COMPLAINT:  ? ?Acute on chronic hypoxemic respiratory failure ? ? ?HISTORY OF PRESENT ILLNESS  ? ?66 year old male with a history of advanced COPD, hypertension, lumbar disc disease and myocardial infarction came in for acute hypoxemic respiratory failure.  Patient reports feeling flulike symptoms for 4 days prior to admission with worsening chest discomfort increased expectoration with thickened inspissated phlegm discolored on appearance.  Patient does have active smoking lifelong history over 50 pack years and reports stopping in the last 1 week.  Chest imaging including CXR reviewed by me independently with findings outlined with pictorial documentation below ? ? ?PAST MEDICAL HISTORY  ? ?Past Medical History:  ?Diagnosis Date  ?? COPD (chronic obstructive pulmonary disease) ([place])   ?? ED (erectile dysfunction)   ?? Hypertension   ?? Lumbar disc disease   ?? Myocardial infarct [facility]   ? ? ? ?SURGICAL HISTORY  ? ?Past Surgical History:  ?Procedure Laterality Date  ?? APPENDECTOMY    ?? CATARACT EXTRACTION W/PHACO Right 04/15/2021  ? Procedure: CATARACT EXTRACTION PHACO AND INTRAOCULAR LENS PLACEMENT ([place]) RIGHT;  Surgeon: Birder Robson, MD;  Location: [facility];  Service: Ophthalmology;  Laterality: Right;  7.82 ?0:42.9  ?? CORONARY STENT INTERVENTION N/A 11/23/2019  ? Procedure: CORONARY STENT INTERVENTION;  Surgeon: Yolonda Kida, MD;  Location: [place] CV [facility];  Service: Cardiovascular;  Laterality: N/A;  RCA  ?? CORONARY/GRAFT ACUTE MI REVASCULARIZATION N/A 11/23/2019  ? Procedure: Coronary/Graft Acute MI Revascularization;  Surgeon: Yolonda Kida, MD;  Location: [place] [facility];  Service: Cardiovascular;  Laterality: N/A;  ?? LEFT HEART CATH AND CORONARY ANGIOGRAPHY  N/A 11/23/2019  ? Procedure: LEFT HEART CATH AND CORONARY ANGIOGRAPHY;  Surgeon: Yolonda Kida, MD;  Location: [place] [facility];  Service: Cardiovascular;  Laterality: N/A;  ? ? ? ?FAMILY HISTORY  ? ?Family History  ?Problem Relation Age of Onset  ?? Hyperlipidemia Father   ?? Cancer Father   ?? Heart disease Father   ?     heart valve disease (from [place]?)  ?? Alcohol abuse Mother   ?? Hypertension Neg Hx   ?? Diabetes Neg Hx   ?? Colon cancer Neg Hx   ?? Colon polyps Neg Hx   ?? Pancreatic cancer Neg Hx   ?? Rectal cancer Neg Hx   ?? Stomach cancer Neg Hx   ? ? ? ?SOCIAL HISTORY  ? ?Social History  ? ?Tobacco Use  ?? Smoking status: Every Day  ?  Packs/day: 1.00  ?  Years: 46.00  ?  Pack years: 46.00  ?  Types: Cigarettes  ?? Smokeless tobacco: Never  ?Vaping Use  ?? Vaping Use: Former  ?? Quit date: 11/30/2016  ?Substance Use Topics  ?? Alcohol use: Yes  ?  Alcohol/week: 4.0 standard drinks  ?  Types: 4 Standard drinks or equivalent per week  ?? Drug use: Yes  ?  Types: Marijuana  ? ? ? ?MEDICATIONS  ? ? ?Home Medication:  ?  ?Current Medication: ? ?Current Facility-Administered Medications:  ??  acetaminophen (TYLENOL) tablet 650 mg, 650 mg, Oral, Q6H PRN **OR** acetaminophen (TYLENOL) suppository 650 mg, 650 mg, Rectal, Q6H PRN, Agbata, Tochukwu, MD ??  albuterol (PROVENTIL) (2.5 MG/3ML) 0.083% nebulizer solution 2.5 mg, 2.5 mg, Nebulization, Q6H PRN, Agbata, Tochukwu, MD, 2.5 mg at 02/15/22 1915 ??  aspirin EC tablet 81 mg, 81 mg, Oral, Q1400, Agbata, Tochukwu, MD, 81 mg at 02/16/22 1308 ??  cefTRIAXone (ROCEPHIN) 1 g in sodium chloride 0.9 % 100 mL IVPB, 1 g, Intravenous, Q24H, Agbata, Tochukwu, MD, Stopped at 02/15/22 1845 ??  enoxaparin (LOVENOX) injection 40 mg, 40 mg, Subcutaneous, Q24H, Agbata, Tochukwu, MD, 40 mg at 67/67/20 9470 ??  folic acid (FOLVITE) tablet 1 mg, 1 mg, Oral, Daily, Agbata, Tochukwu, MD, 1 mg at 02/16/22 0852 ??  lisinopril (ZESTRIL) tablet 5 mg, 5 mg, Oral, Daily,  Agbata, Tochukwu, MD, 5 mg at 02/16/22 0852 ??  metoprolol tartrate (LOPRESSOR) tablet 50 mg, 50 mg, Oral, BID, Agbata, Tochukwu, MD, 50 mg at 02/16/22 9628 ??  mometasone-formoterol (DULERA) 200-5 MCG/ACT inhaler 2 puff, 2 puff, Inhalation, BID, Agbata, Tochukwu, MD, 2 puff at 02/16/22 0849 ??  multivitamin with minerals tablet 1 tablet, 1 tablet, Oral, Daily, Agbata, Tochukwu, MD, 1 tablet at 02/16/22 0852 ??  nicotine (NICODERM CQ - dosed in mg/24 hours) patch 21 mg, 21 mg, Transdermal, Daily, Agbata, Tochukwu, MD, 21 mg at 02/16/22 3662 ??  ondansetron (ZOFRAN) tablet 4 mg, 4 mg, Oral, Q6H PRN **OR** ondansetron (ZOFRAN) injection 4 mg, 4 mg, Intravenous, Q6H PRN, Agbata, Tochukwu, MD ??  pantoprazole (PROTONIX) EC tablet 40 mg, 40 mg, Oral, Daily, Agbata, Tochukwu, MD, 40 mg at 02/16/22 0852 ??  [COMPLETED] methylPREDNISolone sodium succinate (SOLU-MEDROL) 40 mg/mL injection 40 mg, 40 mg, Intravenous, Q12H, 40 mg at 02/16/22 0511 **FOLLOWED BY** predniSONE (DELTASONE) tablet 40 mg, 40 mg, Oral, Q breakfast, Agbata, Tochukwu, MD ??  rosuvastatin (CRESTOR) tablet 40 mg, 40 mg, Oral, Daily, Agbata, Tochukwu, MD, 40 mg at 02/16/22 0852 ??  tiotropium (SPIRIVA) inhalation capsule ([facility] use ONLY) 18 mcg, 18 mcg, Inhalation, Daily, Agbata, Tochukwu, MD, 18 mcg at 02/16/22 0850 ? ? ? ?ALLERGIES  ? ?Patient has no known allergies. ? ? ? ? ?REVIEW OF SYSTEMS  ? ? ?Review of Systems: ? ?Gen:  Denies  fever, sweats, chills weigh loss  ?HEENT: Denies blurred vision, double vision, ear pain, eye pain, hearing loss, nose bleeds, sore throat ?Cardiac:  No dizziness, chest pain or heaviness, chest tightness,edema ?Resp:   Denies cough or sputum porduction, shortness of breath,wheezing, hemoptysis,  ?Gi: Denies swallowing difficulty, stomach pain, nausea or vomiting, diarrhea, constipation, bowel incontinence ?Gu:  Denies bladder incontinence, burning urine ?Ext:   Denies Joint pain, stiffness or swelling ?Skin: Denies  skin  rash, easy bruising or bleeding or hives ?Endoc:  Denies polyuria, polydipsia , polyphagia or weight change ?Psych:   Denies depression, insomnia or hallucinations  ? ?Other:  All other systems negative ? ? ?VS: BP 130/77   Pulse 71   Temp 98 ?F (36.7 ?C)   Resp 16   Ht '5\' 6"'$  (1.676 m)   Wt 81.6 kg   SpO2 96%   BMI 29.04 kg/m?   ? ? ? ?PHYSICAL EXAM  ? ? ?GENERAL:NAD, no fevers, chills, no weakness no fatigue ?HEAD: Normocephalic, atraumatic.  ?EYES: Pupils equal, round, reactive to light. Extraocular muscles intact. No scleral icterus.  ?MOUTH: Moist mucosal membrane. Dentition intact. No abscess noted.  ?EAR, NOSE, THROAT: Clear without exudates. No external lesions.  ?NECK: Supple. No thyromegaly. No nodules. No JVD.  ?PULMONARY: decreased breath sounds with mild rhonchi worse at bases bilaterally.  ?CARDIOVASCULAR: S1 and S2. Regular rate  and rhythm. No murmurs, rubs, or gallops. No edema. Pedal pulses 2+ bilaterally.  ?GASTROINTESTINAL: Soft, nontender, nondistended. No masses. Positive bowel sounds. No hepatosplenomegaly.  ?MUSCULOSKELETAL: No swelling, clubbing, or edema. Range of motion full in all extremities.  ?NEUROLOGIC: Cranial nerves II through XII are intact. No gross focal neurological deficits. Sensation intact. Reflexes intact.  ?SKIN: No ulceration, lesions, rashes, or cyanosis. Skin warm and dry. Turgor intact.  ?PSYCHIATRIC: Mood, affect within normal limits. The patient is awake, alert and oriented x 3. Insight, judgment intact.  ? ? ?  ? ?IMAGING  ? ? ? ? ?Result Notes ?    ?Component Ref Range & Units 1 d ago  ?Adenovirus NOT DETECTED NOT DETECTED   ?Coronavirus 229E NOT DETECTED NOT DETECTED   ?Comment: (NOTE)  ?The Coronavirus on the Respiratory Panel, DOES NOT test for the novel  ?Coronavirus (2019 nCoV)   ?Coronavirus HKU1 NOT DETECTED NOT DETECTED   ?Coronavirus NL63 NOT DETECTED NOT DETECTED   ?Coronavirus OC43 NOT DETECTED NOT DETECTED   ?Metapneumovirus NOT DETECTED NOT  DETECTED   ?Rhinovirus / Enterovirus NOT DETECTED NOT DETECTED   ?Influenza A NOT DETECTED NOT DETECTED   ?Influenza B NOT DETECTED NOT DETECTED   ?Parainfluenza Virus 1 NOT DETECTED NOT DETECTED   ?Parainfluenza Virus 2

## 2022-02-16 NOTE — Progress Notes (Signed)
? ? ? ?Progress Note  ? ? ?Kurt [name]  JXB:[id number] DOB: 1956-05-02  DOA: 02/15/2022 ?PCP: Venia Carbon, MD  ? ? ? ? ?Brief Narrative:  ? ? ?Medical records reviewed and are as summarized below: ? ?Kurt [name] is a 66 y.o. male with medical history significant for COPD, tobacco use disorder, hypertension, CAD s/p coronary stent December 2020 for acute STEMI, who presented to the hospital with ?Cough, wheezing, increasing shortness of breath, nasal congestion and diarrhea.  Symptoms have been going on for about a week.  He was recently started on inhaler and he attributed to his symptoms to the inhaler so he stopped using it.  Reportedly, oxygen saturation was 85% on room air when EMS arrived. ? ?He was admitted to the hospital for acute COPD exacerbation, acute hypoxic respiratory failure.  He was treated with empiric IV antibiotics, steroids and bronchodilators. ? ? ? ? ? ? ? ?Assessment/Plan:  ? ?Principal Problem: ?  COPD with acute exacerbation ([place]) ?Active Problems: ?  Acute respiratory failure ([place]) ?  Hypertension ?  GERD (gastroesophageal reflux disease) ?  Coronary atherosclerosis of native coronary artery ?  Cigarette smoker ? ? ?Body mass index is 29.04 kg/m?. ? ? ? ?COPD exacerbation: Continue IV ceftriaxone, steroids and bronchodilators ? ?Acute hypoxic respiratory failure: He was on 6 L/min oxygen via nasal cannula.  Oxygen has been tapered down to 3 L.  Taper off oxygen as able. ? ?Tobacco use disorder: He said he quit smoking about a week prior to admission.  He does not think he will go back to smoking. ? ?Other comorbidities include CAD, hypertension, GERD ? ? ?Diet Order   ? ?       ?  Diet 2 gram sodium Room service appropriate? Yes; Fluid consistency: Thin  Diet effective now       ?  ? ?  ?  ? ?  ? ? ? ? ? ?Consultants: ?Pulmonologist ? ?Procedures: ?None ? ? ? ?Medications:  ? ? aspirin EC  81 mg Oral Q1400  ? enoxaparin (LOVENOX) injection  40 mg Subcutaneous Z30Q  ? folic acid   1 mg Oral Daily  ? lisinopril  5 mg Oral Daily  ? metoprolol tartrate  50 mg Oral BID  ? mometasone-formoterol  2 puff Inhalation BID  ? multivitamin with minerals  1 tablet Oral Daily  ? nicotine  21 mg Transdermal Daily  ? pantoprazole  40 mg Oral Daily  ? predniSONE  40 mg Oral Q breakfast  ? rosuvastatin  40 mg Oral Daily  ? tiotropium  18 mcg Inhalation Daily  ? ?Continuous Infusions: ? cefTRIAXone (ROCEPHIN)  IV 1 g (02/16/22 1508)  ? ? ? ?Anti-infectives (From admission, onward)  ? ? Start     Dose/Rate Route Frequency Ordered Stop  ? 02/15/22 1530  cefTRIAXone (ROCEPHIN) 1 g in sodium chloride 0.9 % 100 mL IVPB       ? 1 g ?200 mL/hr over 30 Minutes Intravenous Every 24 hours 02/15/22 1523 02/20/22 1529  ? ?  ? ? ? ? ? ? ? ? ? ?Family Communication/Anticipated D/C date and plan/Code Status  ? ?DVT prophylaxis: enoxaparin (LOVENOX) injection 40 mg Start: 02/15/22 1800 ? ? ?  Code Status: Full Code ? ?Family Communication: Plan discussed with Kurt [name] (son) at the bedside ?Disposition Plan: Possible discharge to home in 1 to 2 days ? ? ?Status is: Inpatient ?Remains inpatient appropriate because: Hypoxia, IV antibiotics ? ? ? ? ? ? ?  Subjective:  ? ?Interval events noted.  He complains of cough and shortness of breath. ? ?Objective:  ? ? ?Vitals:  ? 02/15/22 2000 02/16/22 0431 02/16/22 0826 02/16/22 1308  ?BP:  119/83 130/77   ?Pulse:  62 71   ?Resp:  16 16   ?Temp:  97.9 ?F (36.6 ?C) 98 ?F (36.7 ?C)   ?TempSrc:  Oral    ?SpO2:  97% 99% 96%  ?Weight: 81.6 kg     ?Height: '5\' 6"'$  (1.676 m)     ? ?No data found. ? ? ?Intake/Output Summary (Last 24 hours) at 02/16/2022 1537 ?Last data filed at 02/16/2022 1038 ?Gross per 24 hour  ?Intake 1388.2 ml  ?Output 400 ml  ?Net 988.2 ml  ? ?Filed Weights  ? 02/15/22 1324 02/15/22 2000  ?Weight: 89.4 kg 81.6 kg  ? ? ?Exam: ? ?GEN: NAD ?SKIN: No rash ?EYES: EOMI ?ENT: MMM ?CV: RRR ?PULM: Decreased air entry bilaterally, bilateral expiratory wheezing.  No rales ?ABD: soft, ND, NT,  +BS ?CNS: AAO x 3, non focal ?EXT: No edema or tenderness ? ? ? ? ?Data Reviewed:  ? ?I have personally reviewed following labs and imaging studies: ? ?Labs: ?Labs show the following:  ? ?Basic Metabolic Panel: ?Recent Labs  ?Lab 02/15/22 ?1327 02/16/22 ?8250  ?NA 144 139  ?K 4.6 4.8  ?CL 107 108  ?CO2 28 26  ?GLUCOSE 108* 127*  ?BUN 21 22  ?CREATININE 1.13 1.06  ?CALCIUM 9.3 8.6*  ? ?GFR ?Estimated Creatinine Clearance: 71.5 mL/min (by C-G formula based on SCr of 1.06 mg/dL). ?Liver Function Tests: ?Recent Labs  ?Lab 02/15/22 ?1327  ?AST 23  ?ALT 20  ?ALKPHOS 71  ?BILITOT 0.6  ?PROT 8.2*  ?ALBUMIN 4.1  ? ?No results for input(s): LIPASE, AMYLASE in the last 168 hours. ?No results for input(s): AMMONIA in the last 168 hours. ?Coagulation profile ?No results for input(s): INR, PROTIME in the last 168 hours. ? ?CBC: ?Recent Labs  ?Lab 02/15/22 ?1327 02/16/22 ?5397  ?WBC 11.3* 12.7*  ?NEUTROABS 7.3  --   ?HGB 16.1 15.0  ?HCT 51.3 47.4  ?MCV 100.0 98.8  ?PLT 234 207  ? ?Cardiac Enzymes: ?No results for input(s): CKTOTAL, CKMB, CKMBINDEX, TROPONINI in the last 168 hours. ?BNP (last 3 results) ?No results for input(s): PROBNP in the last 8760 hours. ?CBG: ?No results for input(s): GLUCAP in the last 168 hours. ?D-Dimer: ?No results for input(s): DDIMER in the last 72 hours. ?Hgb A1c: ?No results for input(s): HGBA1C in the last 72 hours. ?Lipid Profile: ?No results for input(s): CHOL, HDL, LDLCALC, TRIG, CHOLHDL, LDLDIRECT in the last 72 hours. ?Thyroid function studies: ?No results for input(s): TSH, T4TOTAL, T3FREE, THYROIDAB in the last 72 hours. ? ?Invalid input(s): FREET3 ?Anemia work up: ?No results for input(s): VITAMINB12, FOLATE, FERRITIN, TIBC, IRON, RETICCTPCT in the last 72 hours. ?Sepsis Labs: ?Recent Labs  ?Lab 02/15/22 ?1327 02/16/22 ?6734  ?WBC 11.3* 12.7*  ? ? ?Microbiology ?Recent Results (from the past 240 hour(s))  ?Resp Panel by RT-PCR (Flu A&B, Covid)     Status: None  ? Collection Time: 02/15/22   5:35 PM  ?Result Value Ref Range Status  ? SARS Coronavirus 2 by RT PCR NEGATIVE NEGATIVE Final  ?  Comment: (NOTE) ?SARS-CoV-2 target nucleic acids are NOT DETECTED. ? ?The SARS-CoV-2 RNA is generally detectable in upper respiratory ?specimens during the acute phase of infection. The lowest ?concentration of SARS-CoV-2 viral copies this assay can detect is ?138 copies/mL. A negative  result does not preclude SARS-Cov-2 ?infection and should not be used as the sole basis for treatment or ?other patient management decisions. A negative result may occur with  ?improper specimen collection/handling, submission of specimen other ?than nasopharyngeal swab, presence of viral mutation(s) within the ?areas targeted by this assay, and inadequate number of viral ?copies(<138 copies/mL). A negative result must be combined with ?clinical observations, patient history, and epidemiological ?information. The expected result is Negative. ? ?Fact Sheet for Patients:  ?EntrepreneurPulse.com.au ? ?Fact Sheet for Healthcare Providers:  ?IncredibleEmployment.be ? ?This test is no t yet approved or cleared by the [place] FDA and  ?has been authorized for detection and/or diagnosis of SARS-CoV-2 by ?FDA under an Emergency Use Authorization (EUA). This EUA will remain  ?in effect (meaning this test can be used) for the duration of the ?COVID-19 declaration under Section 564(b)(1) of the Act, 21 ?U.S.C.section 360bbb-3(b)(1), unless the authorization is terminated  ?or revoked sooner.  ? ? ?  ? Influenza A by PCR NEGATIVE NEGATIVE Final  ? Influenza B by PCR NEGATIVE NEGATIVE Final  ?  Comment: (NOTE) ?The Xpert Xpress SARS-CoV-2/FLU/RSV plus assay is intended as an aid ?in the diagnosis of influenza from Nasopharyngeal swab specimens and ?should not be used as a sole basis for treatment. Nasal washings and ?aspirates are unacceptable for Xpert Xpress SARS-CoV-2/FLU/RSV ?testing. ? ?Fact Sheet for  Patients: ?EntrepreneurPulse.com.au ? ?Fact Sheet for Healthcare Providers: ?IncredibleEmployment.be ? ?This test is not yet approved or cleared by the [place] FDA a

## 2022-02-17 ENCOUNTER — Encounter: Payer: Self-pay | Admitting: Internal Medicine

## 2022-02-17 DIAGNOSIS — J9601 Acute respiratory failure with hypoxia: Secondary | ICD-10-CM | POA: Diagnosis not present

## 2022-02-17 DIAGNOSIS — J441 Chronic obstructive pulmonary disease with (acute) exacerbation: Secondary | ICD-10-CM | POA: Diagnosis not present

## 2022-02-17 LAB — RESPIRATORY PANEL BY PCR

## 2022-02-17 MED ORDER — AMOXICILLIN-POT CLAVULANATE 875-125 MG PO TABS: 1.0000 | ORAL_TABLET | Freq: Two times a day (BID) | ORAL | 0 refills | Status: AC

## 2022-02-17 MED ORDER — NICOTINE 21 MG/24HR TD PT24: 21.0000 mg | MEDICATED_PATCH | Freq: Every day | TRANSDERMAL | 0 refills | Status: AC

## 2022-02-17 MED ORDER — PREDNISONE 20 MG PO TABS: 40.0000 mg | ORAL_TABLET | Freq: Every day | ORAL | 0 refills | Status: AC

## 2022-02-17 MED ORDER — ALBUTEROL SULFATE (2.5 MG/3ML) 0.083% IN NEBU: 2.5000 mg | INHALATION_SOLUTION | Freq: Four times a day (QID) | RESPIRATORY_TRACT | 0 refills | Status: DC | PRN

## 2022-02-17 NOTE — Progress Notes (Signed)
Pt discharged per MD order. IV removed. Discharge instructions reviewed with pt. Pt verbalized understanding. Pt taken out in wheelchair via staff.  ?

## 2022-02-17 NOTE — Progress Notes (Signed)
SATURATION QUALIFICATIONS: (This note is used to comply with regulatory documentation for home oxygen) ? ?Patient Saturations on Room Air at Rest = 96% ? ?Patient Saturations on Room Air while Ambulating = 95% ? ?Please briefly explain why patient needs home oxygen: ?

## 2022-02-17 NOTE — Discharge Summary (Signed)
?Physician Discharge Summary ?  ?Patient: Kurt [name] MRN: [id number] DOB: July 12, 1956  ?Admit date:     02/15/2022  ?Discharge date: 02/17/2022  ?Discharge Physician: Jennye Boroughs  ? ?PCP: Venia Carbon, MD  ? ?Recommendations at discharge:  ? ?Follow up with PCP in 1 week ? ?Discharge Diagnoses: ?Principal Problem: ?  COPD with acute exacerbation ([place]) ?Active Problems: ?  Acute respiratory failure ([place]) ?  Hypertension ?  GERD (gastroesophageal reflux disease) ?  Coronary atherosclerosis of native coronary artery ?  Cigarette smoker ? ?Resolved Problems: ?  * No resolved hospital problems. * ? ?Hospital Course: ? ?Kurt [name] is a 66 y.o. male with medical history significant for COPD, tobacco use disorder, hypertension, CAD s/p coronary stent December 2020 for acute STEMI, who presented to the hospital with ?Cough, wheezing, increasing shortness of breath, nasal congestion and diarrhea.  Symptoms have been going on for about a week.  He was recently started on inhaler and he attributed to his symptoms to the inhaler so he stopped using it.  Reportedly, oxygen saturation was 85% on room air when EMS arrived. ? ?He was admitted to the hospital for acute COPD exacerbation, acute hypoxic respiratory failure.  He was treated with empiric IV antibiotics, steroids and bronchodilators.  He initially required 6 L/min oxygen via nasal cannula.  His condition improved and he was weaned off oxygen.  A nebulizer was prescribed for home use.  He said he quit smoking about a week prior to admission.  He has been advised not to go back to smoking cigarettes. ?  ?  ?  ?  ? ?Consultants: Pulmonologist ?Procedures performed: None ?Disposition: Home ?Diet recommendation:  ?Discharge Diet Orders (From admission, onward)  ? ?  Start     Ordered  ? 02/17/22 0000  Diet - low sodium heart healthy       ? 02/17/22 1102  ? ?  ?  ? ?  ? ?Cardiac diet ?DISCHARGE MEDICATION: ?Allergies as of 02/17/2022   ?No Known Allergies ?  ? ?   ?Medication List  ?  ? ?STOP taking these medications   ? ?magnesium oxide 400 (241.3 Mg) MG tablet ?Commonly known as: MAG-OX ?  ? ?  ? ?TAKE these medications   ? ?albuterol 108 (90 Base) MCG/ACT inhaler ?Commonly known as: VENTOLIN HFA ?SMARTSIG:2 Puff(s) By Mouth Every 4 Hours PRN ?What changed: Another medication with the same name was added. Make sure you understand how and when to take each. ?  ?albuterol (2.5 MG/3ML) 0.083% nebulizer solution ?Commonly known as: PROVENTIL ?Take 3 mLs (2.5 mg total) by nebulization every 6 (six) hours as needed for wheezing or shortness of breath. ?What changed: You were already taking a medication with the same name, and this prescription was added. Make sure you understand how and when to take each. ?  ?amoxicillin-clavulanate 875-125 MG tablet ?Commonly known as: Augmentin ?Take 1 tablet by mouth 2 (two) times daily for 3 days. ?  ?aspirin 81 MG EC tablet ?Take 1 tablet by mouth daily at 2 PM. ?  ?folic acid 1 MG tablet ?Commonly known as: FOLVITE ?Take 1 tablet by mouth once daily ?  ?lisinopril 5 MG tablet ?Commonly known as: ZESTRIL ?Take 1 tablet by mouth once daily ?  ?metoprolol tartrate 50 MG tablet ?Commonly known as: LOPRESSOR ?Take 1 tablet by mouth twice daily ?  ?multivitamin with minerals Tabs tablet ?Take 1 tablet by mouth daily. ?  ?nicotine 21 mg/24hr patch ?Commonly known as:  NICODERM CQ - dosed in mg/24 hours ?Place 1 patch (21 mg total) onto the skin daily. ?Start taking on: February 18, 2022 ?  ?predniSONE 20 MG tablet ?Commonly known as: DELTASONE ?Take 2 tablets (40 mg total) by mouth daily with breakfast for 3 days. ?Start taking on: February 18, 2022 ?  ?rosuvastatin 40 MG tablet ?Commonly known as: CRESTOR ?TAKE 1 TABLET BY MOUTH ONCE DAILY AT  6PM ?  ?tiotropium 18 MCG inhalation capsule ?Commonly known as: SPIRIVA ?Place 1 capsule (18 mcg total) into inhaler and inhale daily. ?  ? ?  ? ?  ?  ? ? ?  ?Durable Medical Equipment  ?(From admission, onward)   ?  ? ? ?  ? ?  Start     Ordered  ? 02/17/22 0000  For home use only DME Nebulizer machine       ?Question Answer Comment  ?Patient needs a nebulizer to treat with the following condition Acute exacerbation of chronic obstructive pulmonary disease (COPD) ([place])   ?Length of Need Lifetime   ?  ? 02/17/22 1022  ? ?  ?  ? ?  ? ? ?Discharge Exam: ?Filed Weights  ? 02/15/22 1324 02/15/22 2000  ?Weight: 89.4 kg 81.6 kg  ? ?GEN: NAD ?SKIN: No rash ?EYES: EOMI ?ENT: MMM ?CV: RRR ?PULM: CTA B ?ABD: soft, ND, NT, +BS ?CNS: AAO x 3, non focal ?EXT: No edema or tenderness ? ? ?Condition at discharge: good ? ?The results of significant diagnostics from this hospitalization (including imaging, microbiology, ancillary and laboratory) are listed below for reference.  ? ?Imaging Studies: ?DG Chest Port 1 View ? ?Result Date: 02/15/2022 ?CLINICAL DATA:  Shortness of breath. EXAM: PORTABLE CHEST 1 VIEW COMPARISON:  CT chest dated February 19, 2021. Chest x-ray dated November 30, 2019. FINDINGS: The heart size and mediastinal contours are within normal limits. The lungs remain hyperinflated. No focal consolidation, pleural effusion, or pneumothorax. No acute osseous abnormality. IMPRESSION: 1. No active disease. Electronically Signed   By: Titus Dubin M.D.   On: 02/15/2022 14:41   ? ?Microbiology: ?Results for orders placed or performed during the hospital encounter of 02/15/22  ?Resp Panel by RT-PCR (Flu A&B, Covid)     Status: None  ? Collection Time: 02/15/22  5:35 PM  ?Result Value Ref Range Status  ? SARS Coronavirus 2 by RT PCR NEGATIVE NEGATIVE Final  ?  Comment: (NOTE) ?SARS-CoV-2 target nucleic acids are NOT DETECTED. ? ?The SARS-CoV-2 RNA is generally detectable in upper respiratory ?specimens during the acute phase of infection. The lowest ?concentration of SARS-CoV-2 viral copies this assay can detect is ?138 copies/mL. A negative result does not preclude SARS-Cov-2 ?infection and should not be used as the sole basis for  treatment or ?other patient management decisions. A negative result may occur with  ?improper specimen collection/handling, submission of specimen other ?than nasopharyngeal swab, presence of viral mutation(s) within the ?areas targeted by this assay, and inadequate number of viral ?copies(<138 copies/mL). A negative result must be combined with ?clinical observations, patient history, and epidemiological ?information. The expected result is Negative. ? ?Fact Sheet for Patients:  ?EntrepreneurPulse.com.au ? ?Fact Sheet for Healthcare Providers:  ?IncredibleEmployment.be ? ?This test is no t yet approved or cleared by the [place] FDA and  ?has been authorized for detection and/or diagnosis of SARS-CoV-2 by ?FDA under an Emergency Use Authorization (EUA). This EUA will remain  ?in effect (meaning this test can be used) for the duration of the ?COVID-19  declaration under Section 564(b)(1) of the Act, 21 ?U.S.C.section 360bbb-3(b)(1), unless the authorization is terminated  ?or revoked sooner.  ? ? ?  ? Influenza A by PCR NEGATIVE NEGATIVE Final  ? Influenza B by PCR NEGATIVE NEGATIVE Final  ?  Comment: (NOTE) ?The Xpert Xpress SARS-CoV-2/FLU/RSV plus assay is intended as an aid ?in the diagnosis of influenza from Nasopharyngeal swab specimens and ?should not be used as a sole basis for treatment. Nasal washings and ?aspirates are unacceptable for Xpert Xpress SARS-CoV-2/FLU/RSV ?testing. ? ?Fact Sheet for Patients: ?EntrepreneurPulse.com.au ? ?Fact Sheet for Healthcare Providers: ?IncredibleEmployment.be ? ?This test is not yet approved or cleared by the [place] FDA and ?has been authorized for detection and/or diagnosis of SARS-CoV-2 by ?FDA under an Emergency Use Authorization (EUA). This EUA will remain ?in effect (meaning this test can be used) for the duration of the ?COVID-19 declaration under Section 564(b)(1) of the Act, 21  U.S.C. ?section 360bbb-3(b)(1), unless the authorization is terminated or ?revoked. ? ?Performed at [facility], [place], ?[place] [address] ?  ?Respiratory (~20 pathogens) panel by [facility]

## 2022-02-17 NOTE — TOC Transition Note (Signed)
Transition of Care (TOC) - CM/SW Discharge Note ? ? ?Patient Details  ?Name: Kurt [name] ?MRN: [id number] ?Date of Birth: 1956-11-16 ? ?Transition of Care (TOC) CM/SW Contact:  ?Kerin Salen, RN ?Phone Number: ?02/17/2022, 12:22 PM ? ? ?Final next level of care: Home/Self Care ?Barriers to Discharge: Barriers Resolved ? ? ? ?  ?Patient and family notified of of transfer: 02/17/22 ? ?Discharge Plan and Services ?  ?  ?           ?DME Arranged: Nebulizer machine ?DME Agency: [employer] ?Date DME Agency Contacted: 02/17/22 ?Time DME Agency Contacted: [phone] ?Representative spoke with at DME Agency: Andee Poles ?HH Arranged: NA ?[place] Agency: NA ?  ?  ?  ? ?Social Determinants of Health (SDOH) Interventions ?  ? ? ?Readmission Risk Interventions ?No flowsheet data found. ? ? ? ? ?

## 2022-03-09 ENCOUNTER — Other Ambulatory Visit: Payer: Self-pay | Admitting: *Deleted

## 2022-03-09 DIAGNOSIS — Z87891 Personal history of nicotine dependence: Secondary | ICD-10-CM

## 2022-03-09 DIAGNOSIS — F17210 Nicotine dependence, cigarettes, uncomplicated: Secondary | ICD-10-CM

## 2022-03-09 DIAGNOSIS — Z122 Encounter for screening for malignant neoplasm of respiratory organs: Secondary | ICD-10-CM

## 2022-03-16 ENCOUNTER — Other Ambulatory Visit: Payer: Self-pay | Admitting: Internal Medicine

## 2022-03-19 ENCOUNTER — Ambulatory Visit
Admission: RE | Admit: 2022-03-19 | Discharge: 2022-03-19 | Disposition: A | Discharge status: Home / Self Care | Payer: 59 | Source: Ambulatory Visit | Attending: Acute Care | Admitting: Acute Care

## 2022-03-19 ENCOUNTER — Ambulatory Visit: Payer: 59

## 2022-03-19 DIAGNOSIS — F17210 Nicotine dependence, cigarettes, uncomplicated: Secondary | ICD-10-CM

## 2022-03-19 DIAGNOSIS — Z87891 Personal history of nicotine dependence: Secondary | ICD-10-CM

## 2022-03-19 DIAGNOSIS — J432 Centrilobular emphysema: Secondary | ICD-10-CM | POA: Diagnosis not present

## 2022-03-19 DIAGNOSIS — R69 Illness, unspecified: Secondary | ICD-10-CM | POA: Diagnosis not present

## 2022-03-19 DIAGNOSIS — Z122 Encounter for screening for malignant neoplasm of respiratory organs: Secondary | ICD-10-CM

## 2022-03-19 DIAGNOSIS — I2510 Atherosclerotic heart disease of native coronary artery without angina pectoris: Secondary | ICD-10-CM | POA: Diagnosis not present

## 2022-03-19 DIAGNOSIS — K7689 Other specified diseases of liver: Secondary | ICD-10-CM | POA: Diagnosis not present

## 2022-03-20 ENCOUNTER — Other Ambulatory Visit: Payer: Self-pay | Admitting: Acute Care

## 2022-03-20 DIAGNOSIS — Z87891 Personal history of nicotine dependence: Secondary | ICD-10-CM

## 2022-03-20 DIAGNOSIS — F17210 Nicotine dependence, cigarettes, uncomplicated: Secondary | ICD-10-CM

## 2022-03-20 DIAGNOSIS — Z122 Encounter for screening for malignant neoplasm of respiratory organs: Secondary | ICD-10-CM

## 2022-04-05 ENCOUNTER — Other Ambulatory Visit: Payer: Self-pay | Admitting: Internal Medicine

## 2022-04-13 ENCOUNTER — Other Ambulatory Visit: Payer: Self-pay | Admitting: Internal Medicine

## 2022-04-20 DIAGNOSIS — Z720 Tobacco use: Secondary | ICD-10-CM | POA: Diagnosis not present

## 2022-04-20 DIAGNOSIS — I469 Cardiac arrest, cause unspecified: Secondary | ICD-10-CM | POA: Diagnosis not present

## 2022-04-20 DIAGNOSIS — Z955 Presence of coronary angioplasty implant and graft: Secondary | ICD-10-CM | POA: Diagnosis not present

## 2022-04-20 DIAGNOSIS — I2510 Atherosclerotic heart disease of native coronary artery without angina pectoris: Secondary | ICD-10-CM | POA: Diagnosis not present

## 2022-04-20 DIAGNOSIS — E782 Mixed hyperlipidemia: Secondary | ICD-10-CM | POA: Diagnosis not present

## 2022-04-20 DIAGNOSIS — R0609 Other forms of dyspnea: Secondary | ICD-10-CM | POA: Diagnosis not present

## 2022-04-20 DIAGNOSIS — I10 Essential (primary) hypertension: Secondary | ICD-10-CM | POA: Diagnosis not present

## 2022-04-20 DIAGNOSIS — J449 Chronic obstructive pulmonary disease, unspecified: Secondary | ICD-10-CM | POA: Diagnosis not present

## 2022-04-20 DIAGNOSIS — M25512 Pain in left shoulder: Secondary | ICD-10-CM | POA: Diagnosis not present

## 2022-04-20 DIAGNOSIS — I2111 ST elevation (STEMI) myocardial infarction involving right coronary artery: Secondary | ICD-10-CM | POA: Diagnosis not present

## 2022-04-21 DIAGNOSIS — J449 Chronic obstructive pulmonary disease, unspecified: Secondary | ICD-10-CM | POA: Diagnosis not present

## 2022-04-21 DIAGNOSIS — G4734 Idiopathic sleep related nonobstructive alveolar hypoventilation: Secondary | ICD-10-CM | POA: Diagnosis not present

## 2022-04-21 DIAGNOSIS — R0602 Shortness of breath: Secondary | ICD-10-CM | POA: Diagnosis not present

## 2022-04-28 DIAGNOSIS — R0609 Other forms of dyspnea: Secondary | ICD-10-CM | POA: Diagnosis not present

## 2022-05-15 DIAGNOSIS — H209 Unspecified iridocyclitis: Secondary | ICD-10-CM | POA: Diagnosis not present

## 2022-05-18 DIAGNOSIS — R69 Illness, unspecified: Secondary | ICD-10-CM | POA: Diagnosis not present

## 2022-05-18 DIAGNOSIS — R0609 Other forms of dyspnea: Secondary | ICD-10-CM | POA: Diagnosis not present

## 2022-05-18 DIAGNOSIS — J449 Chronic obstructive pulmonary disease, unspecified: Secondary | ICD-10-CM | POA: Diagnosis not present

## 2022-05-22 DIAGNOSIS — H209 Unspecified iridocyclitis: Secondary | ICD-10-CM | POA: Diagnosis not present

## 2022-05-26 ENCOUNTER — Other Ambulatory Visit: Payer: Self-pay | Admitting: Internal Medicine

## 2022-05-29 ENCOUNTER — Other Ambulatory Visit
Admission: RE | Admit: 2022-05-29 | Discharge: 2022-05-29 | Disposition: A | Discharge status: Home / Self Care | Payer: 59 | Source: Ambulatory Visit | Attending: Ophthalmology | Admitting: Ophthalmology

## 2022-05-29 DIAGNOSIS — H209 Unspecified iridocyclitis: Secondary | ICD-10-CM | POA: Insufficient documentation

## 2022-05-29 LAB — CBC
HCT: 51.4 % (ref 39.0–52.0)
Hemoglobin: 16.7 g/dL (ref 13.0–17.0)
MCH: 32.1 pg (ref 26.0–34.0)
MCHC: 32.5 g/dL (ref 30.0–36.0)
MCV: 98.7 fL (ref 80.0–100.0)
Platelets: 236 10*3/uL (ref 150–400)
RBC: 5.21 MIL/uL (ref 4.22–5.81)
RDW: 13.3 % (ref 11.5–15.5)
WBC: 9 10*3/uL (ref 4.0–10.5)
nRBC: 0 % (ref 0.0–0.2)

## 2022-05-29 LAB — SEDIMENTATION RATE: Sed Rate: 5 mm/hr (ref 0–20)

## 2022-05-29 LAB — RPR: RPR Ser Ql: NONREACTIVE

## 2022-05-30 LAB — RHEUMATOID FACTOR: Rheumatoid fact SerPl-aCnc: 24.4 IU/mL — ABNORMAL HIGH (ref <14.0)

## 2022-06-02 LAB — ACETYLCHOLINE RECEPTOR, BINDING: Acety choline binding ab: <0.03 nmol/L (ref 0.00–0.24)

## 2022-06-03 LAB — QUANTIFERON-TB GOLD PLUS (RQFGPL)
QuantiFERON Mitogen Value: >10 IU/mL
QuantiFERON Nil Value: 0.08 IU/mL
QuantiFERON TB1 Ag Value: 0.25 IU/mL
QuantiFERON TB2 Ag Value: 0.36 IU/mL

## 2022-06-03 LAB — QUANTIFERON-TB GOLD PLUS: QuantiFERON-TB Gold Plus: NEGATIVE

## 2022-06-06 ENCOUNTER — Other Ambulatory Visit: Payer: Self-pay | Admitting: Internal Medicine

## 2022-06-12 DIAGNOSIS — H209 Unspecified iridocyclitis: Secondary | ICD-10-CM | POA: Diagnosis not present

## 2022-06-26 DIAGNOSIS — H209 Unspecified iridocyclitis: Secondary | ICD-10-CM | POA: Diagnosis not present

## 2022-07-09 ENCOUNTER — Other Ambulatory Visit: Payer: Self-pay | Admitting: Internal Medicine

## 2022-07-10 DIAGNOSIS — H209 Unspecified iridocyclitis: Secondary | ICD-10-CM | POA: Diagnosis not present

## 2022-07-20 DIAGNOSIS — Z720 Tobacco use: Secondary | ICD-10-CM | POA: Diagnosis not present

## 2022-07-20 DIAGNOSIS — I2111 ST elevation (STEMI) myocardial infarction involving right coronary artery: Secondary | ICD-10-CM | POA: Diagnosis not present

## 2022-07-20 DIAGNOSIS — R0602 Shortness of breath: Secondary | ICD-10-CM | POA: Diagnosis not present

## 2022-07-20 DIAGNOSIS — K219 Gastro-esophageal reflux disease without esophagitis: Secondary | ICD-10-CM | POA: Diagnosis not present

## 2022-07-20 DIAGNOSIS — E782 Mixed hyperlipidemia: Secondary | ICD-10-CM | POA: Diagnosis not present

## 2022-07-20 DIAGNOSIS — E669 Obesity, unspecified: Secondary | ICD-10-CM | POA: Diagnosis not present

## 2022-07-20 DIAGNOSIS — J449 Chronic obstructive pulmonary disease, unspecified: Secondary | ICD-10-CM | POA: Diagnosis not present

## 2022-07-20 DIAGNOSIS — I10 Essential (primary) hypertension: Secondary | ICD-10-CM | POA: Diagnosis not present

## 2022-07-20 DIAGNOSIS — Z955 Presence of coronary angioplasty implant and graft: Secondary | ICD-10-CM | POA: Diagnosis not present

## 2022-07-21 ENCOUNTER — Encounter: Payer: Self-pay | Admitting: Internal Medicine

## 2022-07-21 ENCOUNTER — Ambulatory Visit (INDEPENDENT_AMBULATORY_CARE_PROVIDER_SITE_OTHER): Payer: 59 | Admitting: Internal Medicine

## 2022-07-21 DIAGNOSIS — L259 Unspecified contact dermatitis, unspecified cause: Secondary | ICD-10-CM | POA: Diagnosis not present

## 2022-07-21 MED ORDER — TRIAMCINOLONE ACETONIDE 0.1 % EX CREA: 1.0000 | TOPICAL_CREAM | Freq: Two times a day (BID) | CUTANEOUS | 1 refills | Status: DC | PRN

## 2022-07-21 MED ORDER — PREDNISONE 20 MG PO TABS: 40.0000 mg | ORAL_TABLET | Freq: Every day | ORAL | 0 refills | Status: DC

## 2022-07-21 NOTE — Assessment & Plan Note (Signed)
Fairly classic look--no clear etiology Almost a week in--but still spreading Will give prednisone for 8 days (40 x 4, 20 x 4) TAC for topical use

## 2022-07-21 NOTE — Progress Notes (Signed)
Subjective:    Patient ID: Kurt [name], male    DOB: [birth year], 66 y.o.   MRN: [id number]  HPI Here due to rash  Started on right arm---volar surface With red bumps and then blisters Now more extensive on arm---also left arm, trunk and right leg Very itchy and painful Started almost a week ago  Mowed grass 2 days before No fertilizer or chemicals No new meds Wasn't carrying anything  Did try calamine spray---did soothe the itching But still worsened  Current Outpatient Medications on File Prior to Visit  Medication Sig Dispense Refill   albuterol (PROVENTIL) (2.5 MG/3ML) 0.083% nebulizer solution Take 3 mLs (2.5 mg total) by nebulization every 6 (six) hours as needed for wheezing or shortness of breath. 360 mL 0   albuterol (VENTOLIN HFA) 108 (90 Base) MCG/ACT inhaler INHALE 2 PUFFS BY MOUTH EVERY 4 HOURS AS NEEDED 18 g 0   aspirin 81 MG EC tablet Take 1 tablet by mouth daily at 2 PM.     folic acid (FOLVITE) 1 MG tablet Take 1 tablet by mouth once daily 90 tablet 3   lisinopril (ZESTRIL) 5 MG tablet Take 1 tablet by mouth once daily 90 tablet 3   metoprolol tartrate (LOPRESSOR) 50 MG tablet Take 1 tablet by mouth twice daily 180 tablet 3   Multiple Vitamin (MULTIVITAMIN WITH MINERALS) TABS tablet Take 1 tablet by mouth daily. 30 tablet 0   nicotine (NICODERM CQ - DOSED IN MG/24 HOURS) 21 mg/24hr patch Place 1 patch (21 mg total) onto the skin daily. 28 patch 0   rosuvastatin (CRESTOR) 40 MG tablet TAKE 1 TABLET BY MOUTH ONCE DAILY AT 6PM 90 tablet 3   TRELEGY ELLIPTA 100-62.5-25 MCG/ACT AEPB Inhale 1 puff into the lungs daily.     No current facility-administered medications on file prior to visit.    No Known Allergies  Past Medical History:  Diagnosis Date   COPD (chronic obstructive pulmonary disease) (HCC)    ED (erectile dysfunction)    Hypertension    Lumbar disc disease    Myocardial infarct ([facility])    TB lung, latent    Took a pill for 1 year in 1977     Past Surgical History:  Procedure Laterality Date   APPENDECTOMY     CATARACT EXTRACTION W/PHACO Right 04/15/2021   Procedure: CATARACT EXTRACTION PHACO AND INTRAOCULAR LENS PLACEMENT ([place]) RIGHT;  Surgeon: Birder Robson, MD;  Location: [place];  Service: Ophthalmology;  Laterality: Right;  7.82 0:42.9   CORONARY STENT INTERVENTION N/A 11/23/2019   Procedure: CORONARY STENT INTERVENTION;  Surgeon: Yolonda Kida, MD;  Location: [place] [facility];  Service: Cardiovascular;  Laterality: N/A;  RCA   CORONARY/GRAFT ACUTE MI REVASCULARIZATION N/A 11/23/2019   Procedure: Coronary/Graft Acute MI Revascularization;  Surgeon: Yolonda Kida, MD;  Location: [place] [facility];  Service: Cardiovascular;  Laterality: N/A;   LEFT HEART CATH AND CORONARY ANGIOGRAPHY N/A 11/23/2019   Procedure: LEFT HEART CATH AND CORONARY ANGIOGRAPHY;  Surgeon: Yolonda Kida, MD;  Location: [place];  Service: Cardiovascular;  Laterality: N/A;    Family History  Problem Relation Age of Onset   Hyperlipidemia Father    Cancer Father    Heart disease Father        heart valve disease (from Agent Orange?)   Alcohol abuse Mother    Hypertension Neg Hx    Diabetes Neg Hx    Colon cancer Neg Hx    Colon  polyps Neg Hx    Pancreatic cancer Neg Hx    Rectal cancer Neg Hx    Stomach cancer Neg Hx     Social History   Socioeconomic History   Marital status: Widowed    Spouse name: Not on file   Number of children: 1   Years of education: Not on file   Highest education level: Not on file  Occupational History   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  Tobacco Use   Smoking status: Every Day    Packs/day: 1.00    Years: 46.00    Total pack years: 46.00    Types: Cigarettes    Passive exposure: Past   Smokeless tobacco: Never  Vaping Use   Vaping Use: Former   Quit date: 11/30/2016  Substance and Sexual Activity   Alcohol use: Yes     Alcohol/week: 4.0 standard drinks of alcohol    Types: 4 Standard drinks or equivalent per week   Drug use: Yes    Types: Marijuana   Sexual activity: Not on file  Other Topics Concern   Not on file  Social History Narrative   Widowed 2020   Social Determinants of Health   Financial Resource Strain: Not on file  Food Insecurity: Not on file  Transportation Needs: Not on file  Physical Activity: Not on file  Stress: Not on file  Social Connections: Not on file  Intimate Partner Violence: Not on file   Review of Systems     Objective:   Physical Exam Constitutional:      Appearance: Normal appearance.  Skin:    Comments: Papulovesicular lesions on volar right arm/forearm and right chest (new in past 1-2 days) Scattered on right thigh and left arm  Neurological:     Mental Status: He is alert.            Assessment & Plan:

## 2022-07-23 DIAGNOSIS — Z7960 Long term (current) use of unspecified immunomodulators and immunosuppressants: Secondary | ICD-10-CM | POA: Diagnosis not present

## 2022-07-23 DIAGNOSIS — Z8669 Personal history of other diseases of the nervous system and sense organs: Secondary | ICD-10-CM | POA: Diagnosis not present

## 2022-07-23 DIAGNOSIS — M7989 Other specified soft tissue disorders: Secondary | ICD-10-CM | POA: Diagnosis not present

## 2022-07-23 DIAGNOSIS — M0579 Rheumatoid arthritis with rheumatoid factor of multiple sites without organ or systems involvement: Secondary | ICD-10-CM | POA: Diagnosis not present

## 2022-08-06 ENCOUNTER — Other Ambulatory Visit: Payer: Self-pay | Admitting: Internal Medicine

## 2022-08-24 ENCOUNTER — Other Ambulatory Visit: Payer: Self-pay | Admitting: Internal Medicine

## 2022-08-26 DIAGNOSIS — Z7960 Long term (current) use of unspecified immunomodulators and immunosuppressants: Secondary | ICD-10-CM | POA: Diagnosis not present

## 2022-08-26 DIAGNOSIS — R768 Other specified abnormal immunological findings in serum: Secondary | ICD-10-CM | POA: Diagnosis not present

## 2022-08-26 DIAGNOSIS — M0579 Rheumatoid arthritis with rheumatoid factor of multiple sites without organ or systems involvement: Secondary | ICD-10-CM | POA: Diagnosis not present

## 2022-09-15 ENCOUNTER — Other Ambulatory Visit: Payer: Self-pay | Admitting: Internal Medicine

## 2022-10-05 ENCOUNTER — Other Ambulatory Visit: Payer: Self-pay | Admitting: Internal Medicine

## 2022-10-08 IMAGING — CT CT CHEST LUNG CANCER SCREENING LOW DOSE W/O CM
1 of 2 series · 15 of 33 positions shown, 19 images · non-contrast
Comparison: 02/19/2021

CLINICAL DATA: Lung cancer screening. Current asymptomatic smoker.
Thirty-one pack-year history.



[Series 4: chest 1mm/super d · axial · 0.83mm/px · z∈[-325,-41]mm · 15 of 347 slices shown, 19 images]
[im 16/347  mediastinal]
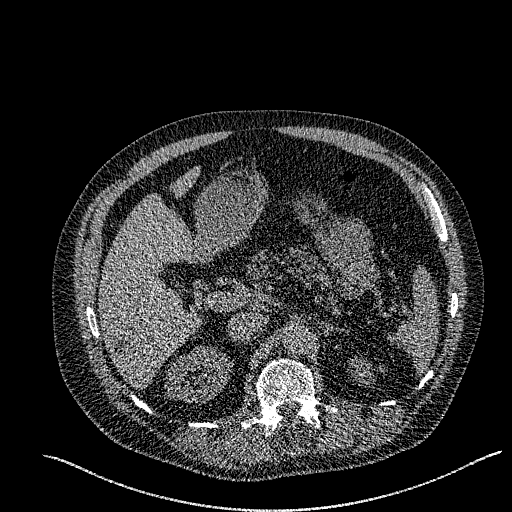
[im 16/347  lung]
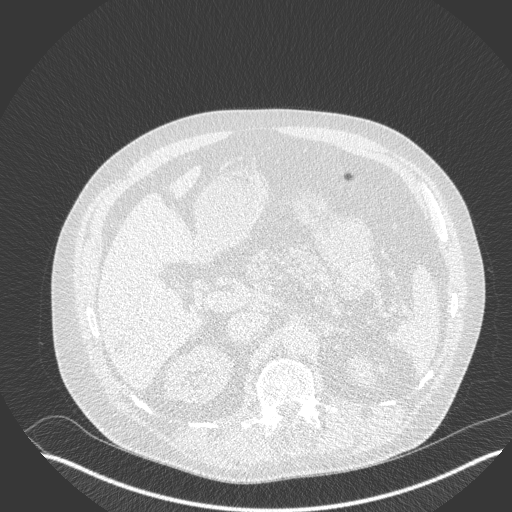
[im 46/347  lung]
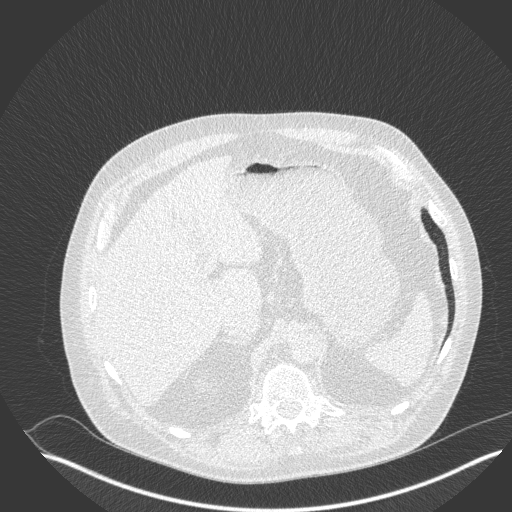
[im 76/347  lung]
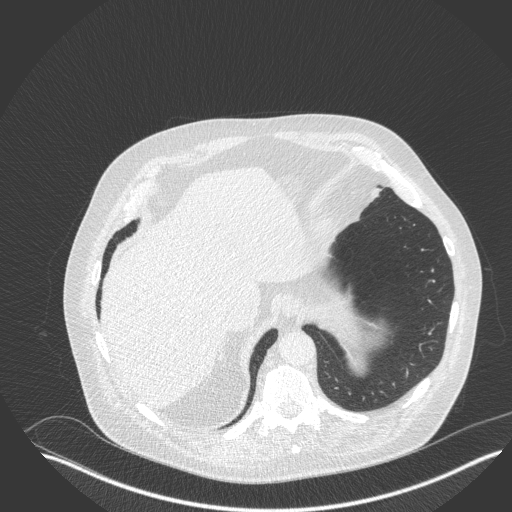
[im 91/347  lung]
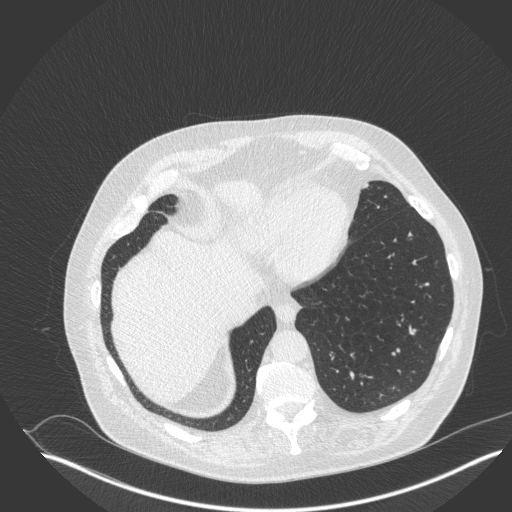
[im 106/347  mediastinal]
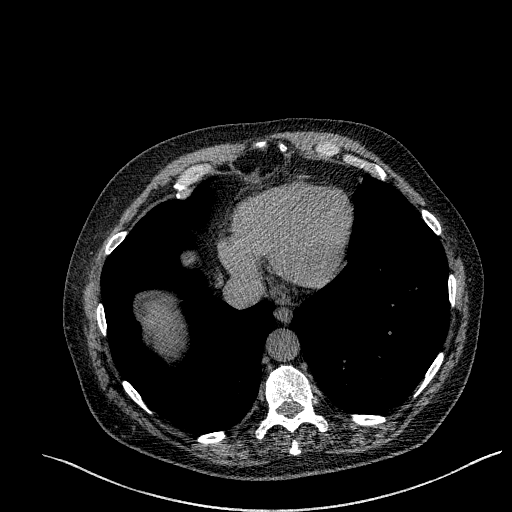
[im 106/347  lung]
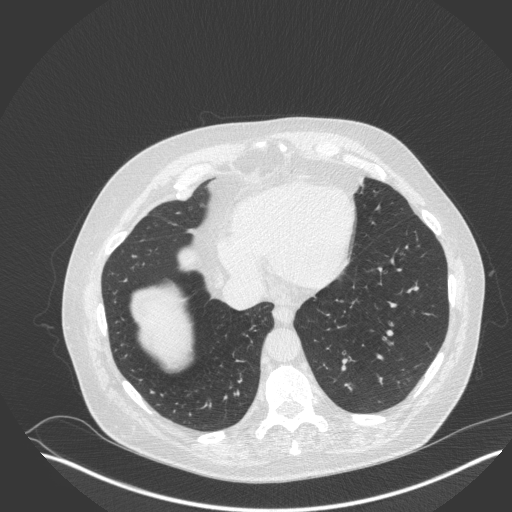
[im 136/347  lung]
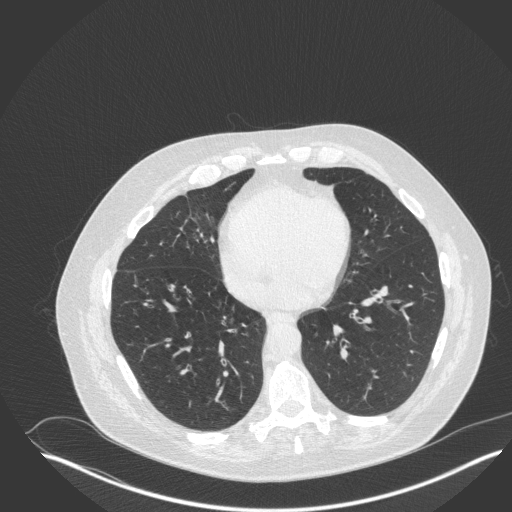
[im 163/347  lung]
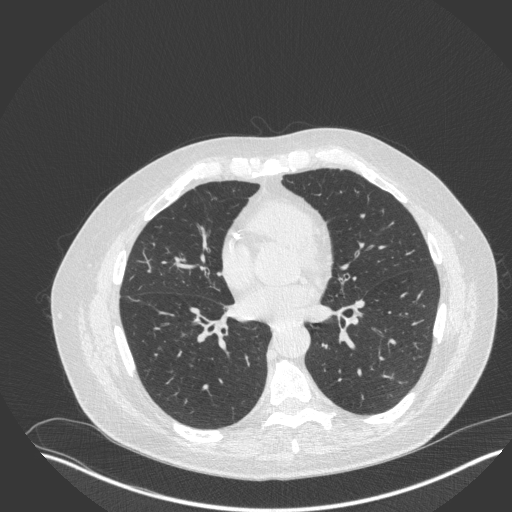
[im 174/347  lung]
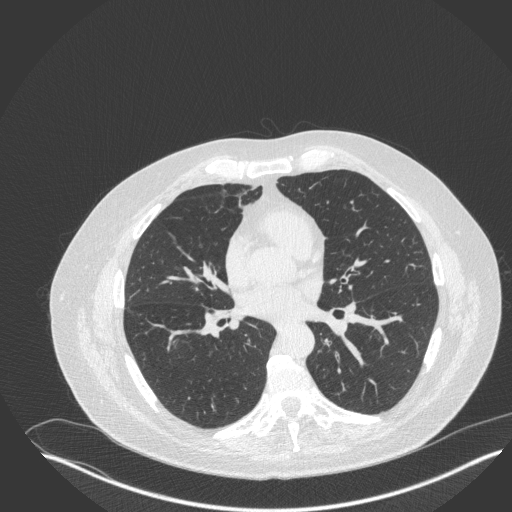
[im 181/347  mediastinal]
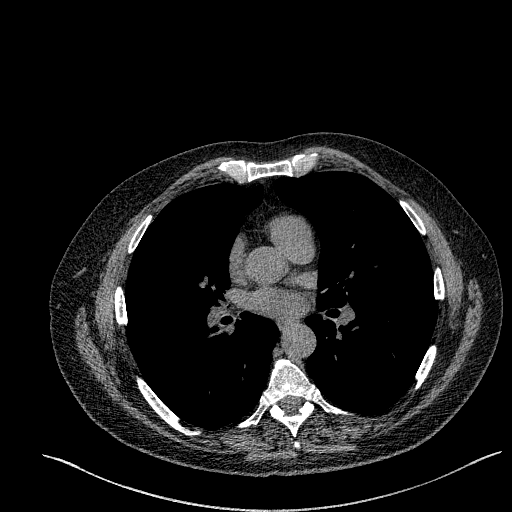
[im 181/347  lung]
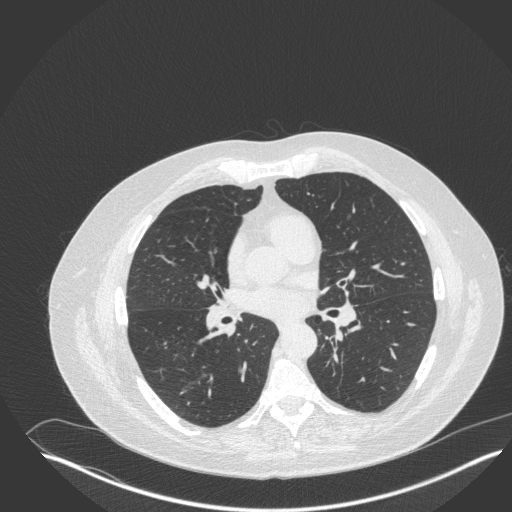
[im 211/347  lung]
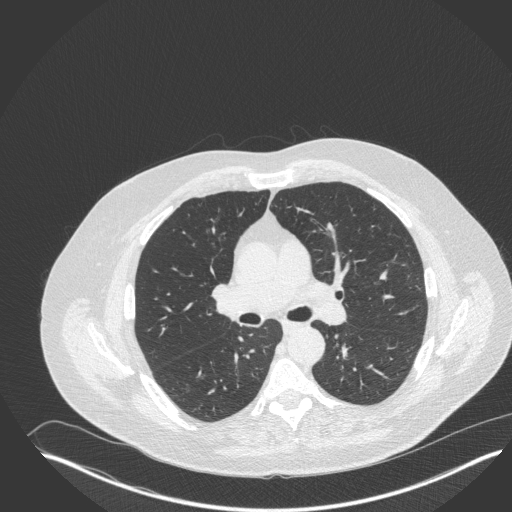
[im 241/347  lung]
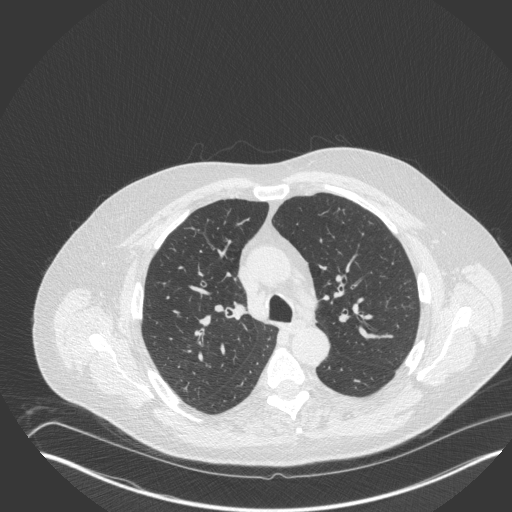
[im 256/347  lung]
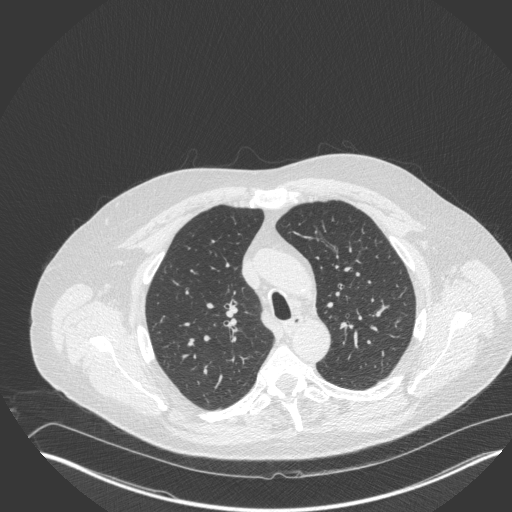
[im 271/347  mediastinal]
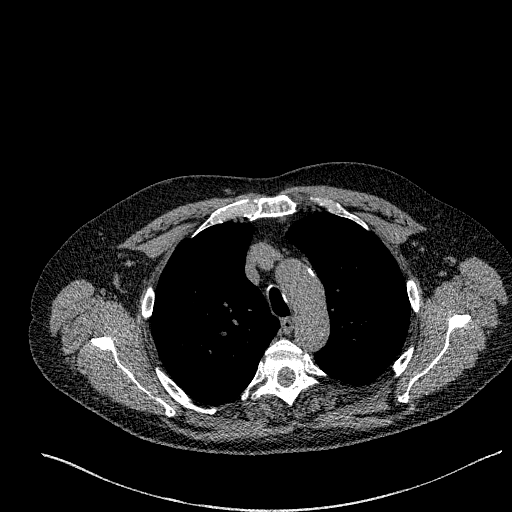
[im 271/347  lung]
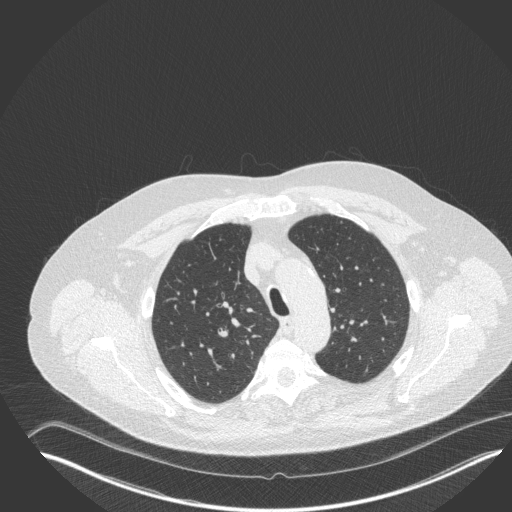
[im 301/347  lung]
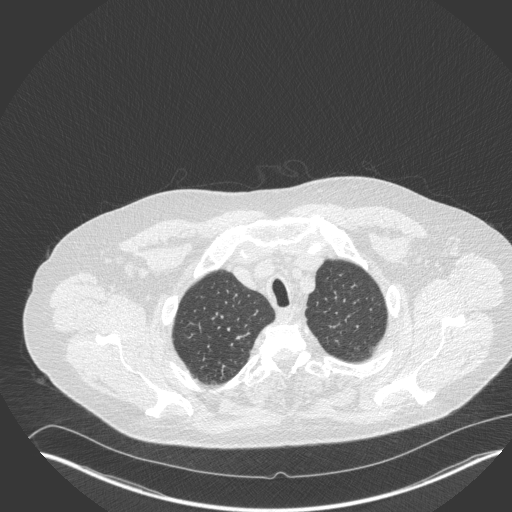
[im 331/347  lung]
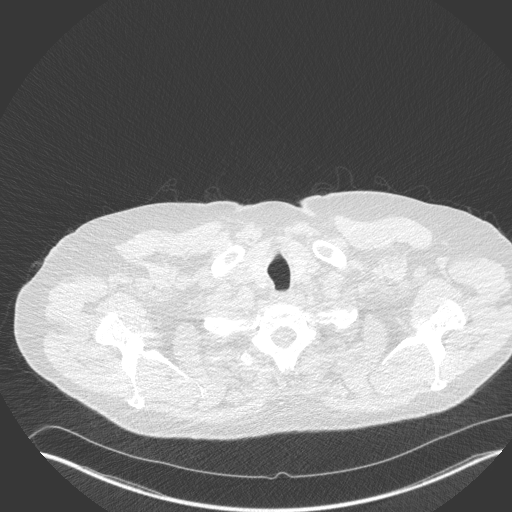

[15 of 33 positions shown; findings below may reference images not displayed]

FINDINGS: Cardiovascular: The heart size is normal. No pericardial effusion.
Aortic atherosclerosis. Coronary artery calcifications.

Mediastinum/Nodes: No enlarged mediastinal, hilar, or axillary lymph
nodes. Thyroid gland, trachea, and esophagus demonstrate no
significant findings.

Lungs/Pleura: Mild centrilobular and paraseptal emphysema with
diffuse bronchial wall thickening. No pleural effusion or airspace
consolidation. Small nodule within the right lower lobe has a mean
derived diameter of 3.1 mm. This is not significantly changed from
previous exam. No new lung nodules.

Upper Abdomen: Unchanged liver cysts.  No acute abnormality.

Musculoskeletal: No chest wall mass or suspicious bone lesions
identified.
IMPRESSION: 1. Lung-RADS 1, negative. Continue annual screening with low-dose
chest CT without contrast in 12 months.
2. Diffuse bronchial wall thickening with emphysema, as above;
imaging findings suggestive of underlying COPD.
3. Coronary artery calcifications.
4. Aortic Atherosclerosis (011BX-O2U.U) and Emphysema (011BX-9D2.L).

## 2022-10-12 ENCOUNTER — Other Ambulatory Visit: Payer: Self-pay | Admitting: Internal Medicine

## 2022-10-19 ENCOUNTER — Telehealth: Payer: Self-pay | Admitting: Internal Medicine

## 2022-10-19 MED ORDER — ALBUTEROL SULFATE (2.5 MG/3ML) 0.083% IN NEBU
2.5000 mg | INHALATION_SOLUTION | Freq: Four times a day (QID) | RESPIRATORY_TRACT | 0 refills | Status: AC | PRN
Start: 2022-10-19 — End: ?

## 2022-10-19 NOTE — Telephone Encounter (Signed)
  Encourage patient to contact the pharmacy for refills or they can request refills through [facility]:  Please schedule appointment if longer than 1 year  NEXT APPOINTMENT DATE:  MEDICATION:  albuterol (VENTOLIN HFA) 108 (90 Base) MCG/ACT inhaler  Is the patient out of medication?   PHARMACY: [place], [place] - [address]    Let patient know to contact pharmacy at the end of the day to make sure medication is ready.  Please notify patient to allow 48-72 hours to process  CLINICAL FILLS OUT ALL BELOW:   LAST REFILL:  QTY:  REFILL DATE:    OTHER COMMENTS:    Okay for refill?  Please advise

## 2022-10-21 ENCOUNTER — Other Ambulatory Visit: Payer: Self-pay | Admitting: Internal Medicine

## 2022-10-21 NOTE — Telephone Encounter (Signed)
Pt called in state he need his Albuterol Inhaler . Would like it to be call in

## 2022-10-21 NOTE — Telephone Encounter (Signed)
Refill done earlier this am.

## 2022-10-28 DIAGNOSIS — Z7960 Long term (current) use of unspecified immunomodulators and immunosuppressants: Secondary | ICD-10-CM | POA: Diagnosis not present

## 2022-10-28 DIAGNOSIS — M0579 Rheumatoid arthritis with rheumatoid factor of multiple sites without organ or systems involvement: Secondary | ICD-10-CM | POA: Diagnosis not present

## 2022-11-19 DIAGNOSIS — S9031XD Contusion of right foot, subsequent encounter: Secondary | ICD-10-CM | POA: Diagnosis not present

## 2022-11-19 DIAGNOSIS — K219 Gastro-esophageal reflux disease without esophagitis: Secondary | ICD-10-CM | POA: Diagnosis not present

## 2022-11-19 DIAGNOSIS — R0602 Shortness of breath: Secondary | ICD-10-CM | POA: Diagnosis not present

## 2022-11-19 DIAGNOSIS — J449 Chronic obstructive pulmonary disease, unspecified: Secondary | ICD-10-CM | POA: Diagnosis not present

## 2022-12-01 DIAGNOSIS — I781 Nevus, non-neoplastic: Secondary | ICD-10-CM | POA: Diagnosis not present

## 2023-01-26 ENCOUNTER — Other Ambulatory Visit: Payer: Self-pay | Admitting: Internal Medicine

## 2023-01-26 ENCOUNTER — Ambulatory Visit (INDEPENDENT_AMBULATORY_CARE_PROVIDER_SITE_OTHER): Payer: 59 | Admitting: Internal Medicine

## 2023-01-26 ENCOUNTER — Encounter: Payer: Self-pay | Admitting: Internal Medicine

## 2023-01-26 VITALS — BP 122/80 | HR 75 | Temp 97.3°F | Ht 66.25 in | Wt 199.0 lb

## 2023-01-26 DIAGNOSIS — Z0000 Encounter for general adult medical examination without abnormal findings: Secondary | ICD-10-CM

## 2023-01-26 DIAGNOSIS — Z125 Encounter for screening for malignant neoplasm of prostate: Secondary | ICD-10-CM

## 2023-01-26 DIAGNOSIS — I2510 Atherosclerotic heart disease of native coronary artery without angina pectoris: Secondary | ICD-10-CM

## 2023-01-26 DIAGNOSIS — J449 Chronic obstructive pulmonary disease, unspecified: Secondary | ICD-10-CM

## 2023-01-26 LAB — COMPREHENSIVE METABOLIC PANEL
ALT: 34 U/L (ref 0–53)
AST: 25 U/L (ref 0–37)
Albumin: 3.9 g/dL (ref 3.5–5.2)
Alkaline Phosphatase: 79 U/L (ref 39–117)
BUN: 15 mg/dL (ref 6–23)
CO2: 30 mEq/L (ref 19–32)
Calcium: 9.5 mg/dL (ref 8.4–10.5)
Chloride: 107 mEq/L (ref 96–112)
Creatinine, Ser: 0.94 mg/dL (ref 0.40–1.50)
GFR: 85.74 mL/min (ref >60.00)
Glucose, Bld: 92 mg/dL (ref 70–99)
Potassium: 4.4 mEq/L (ref 3.5–5.1)
Sodium: 142 mEq/L (ref 135–145)
Total Bilirubin: 0.5 mg/dL (ref 0.2–1.2)
Total Protein: 6.6 g/dL (ref 6.0–8.3)

## 2023-01-26 LAB — LIPID PANEL
Cholesterol: 117 mg/dL (ref 0–200)
HDL: 39.5 mg/dL (ref >39.00)
LDL Cholesterol: 48 mg/dL (ref 0–99)
NonHDL: 77.97
Total CHOL/HDL Ratio: 3
Triglycerides: 151 mg/dL — ABNORMAL HIGH (ref 0.0–149.0)
VLDL: 30.2 mg/dL (ref 0.0–40.0)

## 2023-01-26 LAB — CBC
HCT: 44.9 % (ref 39.0–52.0)
Hemoglobin: 15 g/dL (ref 13.0–17.0)
MCHC: 33.5 g/dL (ref 30.0–36.0)
MCV: 101.9 fl — ABNORMAL HIGH (ref 78.0–100.0)
Platelets: 207 10*3/uL (ref 150.0–400.0)
RBC: 4.4 Mil/uL (ref 4.22–5.81)
RDW: 15.2 % (ref 11.5–15.5)
WBC: 6.6 10*3/uL (ref 4.0–10.5)

## 2023-01-26 LAB — PSA: PSA: 3.99 ng/mL (ref 0.10–4.00)

## 2023-01-26 MED ORDER — ALBUTEROL SULFATE HFA 108 (90 BASE) MCG/ACT IN AERS: 2.0000 | INHALATION_SPRAY | RESPIRATORY_TRACT | 5 refills | Status: DC | PRN

## 2023-01-26 NOTE — Assessment & Plan Note (Addendum)
Stable DOE on the trelegy ellipta daily Albuterol for prn Urged him to use the nicotine patch more

## 2023-01-26 NOTE — Assessment & Plan Note (Signed)
Doing okay Colonoscopy due in October Will check PSA Recommended COVID, flu, RSV vaccines in the fall Lung cancer screening yearly

## 2023-01-26 NOTE — Progress Notes (Signed)
Subjective:    Patient ID: Kurt [name], male    DOB: 1956-11-10, 67 y.o.   MRN: [id number]  HPI Here for physical  Having trouble sleeping Cardiologist gave him amitriptyline and trazodone--made him worse Uses z-quil or 1-2 whiskeys (discussed this)  On methotrexate weekly for his RA Just approved for SSI disability  Breathing is okay Only problem is with exertion--overdoing it----especially in the summer Still smoking---uses patch a few times a month  Current Outpatient Medications on File Prior to Visit  Medication Sig Dispense Refill   albuterol (PROVENTIL) (2.5 MG/3ML) 0.083% nebulizer solution Take 3 mLs (2.5 mg total) by nebulization every 6 (six) hours as needed for wheezing or shortness of breath. 360 mL 0   albuterol (VENTOLIN HFA) 108 (90 Base) MCG/ACT inhaler INHALE 2 PUFFS BY MOUTH EVERY 4 HOURS AS NEEDED 6.7 g 1   aspirin 81 MG EC tablet Take 1 tablet by mouth daily at 2 PM.     folic acid (FOLVITE) 1 MG tablet Take 1 tablet by mouth once daily 90 tablet 3   lisinopril (ZESTRIL) 5 MG tablet Take 1 tablet by mouth once daily 90 tablet 3   methotrexate (RHEUMATREX) 2.5 MG tablet Take 17.5 mg by mouth once a week. Caution:Chemotherapy. Protect from light.     metoprolol tartrate (LOPRESSOR) 50 MG tablet Take 1 tablet by mouth twice daily 180 tablet 3   Multiple Vitamin (MULTIVITAMIN WITH MINERALS) TABS tablet Take 1 tablet by mouth daily. 30 tablet 0   nicotine (NICODERM CQ - DOSED IN MG/24 HOURS) 21 mg/24hr patch Place 1 patch (21 mg total) onto the skin daily. 28 patch 0   rosuvastatin (CRESTOR) 40 MG tablet TAKE 1 TABLET BY MOUTH ONCE DAILY AT 6PM 90 tablet 3   TRELEGY ELLIPTA 100-62.5-25 MCG/ACT AEPB Inhale 1 puff into the lungs daily.     triamcinolone cream (KENALOG) 0.1 % Apply 1 Application topically 2 (two) times daily as needed. 45 g 1   No current facility-administered medications on file prior to visit.    No Known Allergies  Past Medical History:   Diagnosis Date   COPD (chronic obstructive pulmonary disease) (HCC)    ED (erectile dysfunction)    Hypertension    Lumbar disc disease    Myocardial infarct (HCC)    TB lung, latent    Took a pill for 1 year in 1977    Past Surgical History:  Procedure Laterality Date   APPENDECTOMY     CATARACT EXTRACTION W/PHACO Right 04/15/2021   Procedure: CATARACT EXTRACTION PHACO AND INTRAOCULAR LENS PLACEMENT ([place]) RIGHT;  Surgeon: Birder Robson, MD;  Location: [place];  Service: Ophthalmology;  Laterality: Right;  7.82 0:42.9   CORONARY STENT INTERVENTION N/A 11/23/2019   Procedure: CORONARY STENT INTERVENTION;  Surgeon: Yolonda Kida, MD;  Location: [facility];  Service: Cardiovascular;  Laterality: N/A;  RCA   CORONARY/GRAFT ACUTE MI REVASCULARIZATION N/A 11/23/2019   Procedure: Coronary/Graft Acute MI Revascularization;  Surgeon: Yolonda Kida, MD;  Location: [facility];  Service: Cardiovascular;  Laterality: N/A;   LEFT HEART CATH AND CORONARY ANGIOGRAPHY N/A 11/23/2019   Procedure: LEFT HEART CATH AND CORONARY ANGIOGRAPHY;  Surgeon: Yolonda Kida, MD;  Location: [facility];  Service: Cardiovascular;  Laterality: N/A;    Family History  Problem Relation Age of Onset   Hyperlipidemia Father    Cancer Father    Heart disease Father        heart  valve disease (from Agent Orange?)   Alcohol abuse Mother    Hypertension Neg Hx    Diabetes Neg Hx    Colon cancer Neg Hx    Colon polyps Neg Hx    Pancreatic cancer Neg Hx    Rectal cancer Neg Hx    Stomach cancer Neg Hx     Social History   Socioeconomic History   Marital status: Widowed    Spouse name: Not on file   Number of children: 1   Years of education: Not on file   Highest education level: Not on file  Occupational History   Occupation: disabled  Tobacco Use   Smoking status: Every Day    Packs/day: 1.00    Years: 46.00    Total pack years: 46.00     Types: Cigarettes    Passive exposure: Past   Smokeless tobacco: Never  Vaping Use   Vaping Use: Former   Quit date: 11/30/2016  Substance and Sexual Activity   Alcohol use: Yes    Alcohol/week: 4.0 standard drinks of alcohol    Types: 4 Standard drinks or equivalent per week   Drug use: Yes    Types: Marijuana   Sexual activity: Not on file  Other Topics Concern   Not on file  Social History Narrative   Widowed 2020   Social Determinants of Health   Financial Resource Strain: Not on file  Food Insecurity: Not on file  Transportation Needs: Not on file  Physical Activity: Not on file  Stress: Not on file  Social Connections: Not on file  Intimate Partner Violence: Not on file   Review of Systems  Constitutional:  Negative for unexpected weight change.       Wears seat belt Energy is variable  HENT:  Positive for dental problem. Negative for hearing loss and tinnitus.        Likely will need rest of teeth pulled  Eyes:  Negative for visual disturbance.       No diplopia or unilateral vision loss  Respiratory:  Positive for cough and shortness of breath. Negative for chest tightness.   Cardiovascular:  Negative for chest pain, palpitations and leg swelling.  Gastrointestinal:  Negative for blood in stool and constipation.       No heartburn  Endocrine: Negative for polydipsia and polyuria.  Genitourinary:  Negative for difficulty urinating and urgency.       No sex--no problem  Musculoskeletal:        Gets aching if he overdoes it--tylenol and hot tub will help  Skin:  Negative for rash.  Allergic/Immunologic: Negative for environmental allergies and immunocompromised state.  Neurological:  Negative for dizziness, syncope, light-headedness and headaches.  Hematological:  Negative for adenopathy.       Easy bruising is not as bad  Psychiatric/Behavioral:  Positive for sleep disturbance. Negative for dysphoric mood. The patient is not nervous/anxious.       Objective:    Physical Exam Constitutional:      Appearance: Normal appearance.  HENT:     Mouth/Throat:     Pharynx: No oropharyngeal exudate or posterior oropharyngeal erythema.  Eyes:     Conjunctiva/sclera: Conjunctivae normal.     Pupils: Pupils are equal, round, and reactive to light.  Cardiovascular:     Rate and Rhythm: Normal rate and regular rhythm.     Pulses: Normal pulses.     Heart sounds:     No gallop.  Pulmonary:     Effort:  Pulmonary effort is normal.     Breath sounds: Normal breath sounds. No rales.     Comments: Slight expiratory wheeze but not tight Abdominal:     Palpations: Abdomen is soft.     Tenderness: There is no abdominal tenderness.  Musculoskeletal:     Cervical back: Neck supple.     Right lower leg: No edema.     Left lower leg: No edema.  Lymphadenopathy:     Cervical: No cervical adenopathy.  Skin:    Findings: No lesion or rash.  Neurological:     General: No focal deficit present.     Mental Status: He is alert and oriented to person, place, and time.  Psychiatric:        Mood and Affect: Mood normal.        Behavior: Behavior normal.            Assessment & Plan:

## 2023-01-26 NOTE — Assessment & Plan Note (Signed)
Has DOE--likely pulmonary On ASA 81, lisinopril 5, metoprolol 50 bid, rosuvastatin 40

## 2023-01-29 DIAGNOSIS — Z7960 Long term (current) use of unspecified immunomodulators and immunosuppressants: Secondary | ICD-10-CM | POA: Diagnosis not present

## 2023-01-29 DIAGNOSIS — M0579 Rheumatoid arthritis with rheumatoid factor of multiple sites without organ or systems involvement: Secondary | ICD-10-CM | POA: Diagnosis not present

## 2023-01-31 ENCOUNTER — Other Ambulatory Visit: Payer: Self-pay | Admitting: Acute Care

## 2023-01-31 DIAGNOSIS — F17210 Nicotine dependence, cigarettes, uncomplicated: Secondary | ICD-10-CM

## 2023-01-31 DIAGNOSIS — Z87891 Personal history of nicotine dependence: Secondary | ICD-10-CM

## 2023-01-31 DIAGNOSIS — Z122 Encounter for screening for malignant neoplasm of respiratory organs: Secondary | ICD-10-CM

## 2023-03-08 DIAGNOSIS — H2512 Age-related nuclear cataract, left eye: Secondary | ICD-10-CM | POA: Diagnosis not present

## 2023-03-08 DIAGNOSIS — H35379 Puckering of macula, unspecified eye: Secondary | ICD-10-CM | POA: Diagnosis not present

## 2023-03-08 DIAGNOSIS — H43813 Vitreous degeneration, bilateral: Secondary | ICD-10-CM | POA: Diagnosis not present

## 2023-03-15 ENCOUNTER — Other Ambulatory Visit: Payer: Self-pay | Admitting: Internal Medicine

## 2023-03-22 ENCOUNTER — Other Ambulatory Visit: Payer: Self-pay | Admitting: Internal Medicine

## 2023-03-23 ENCOUNTER — Ambulatory Visit
Admission: RE | Admit: 2023-03-23 | Discharge: 2023-03-23 | Disposition: A | Discharge status: Home / Self Care | Payer: 59 | Source: Ambulatory Visit | Attending: Acute Care | Admitting: Acute Care

## 2023-03-23 DIAGNOSIS — Z87891 Personal history of nicotine dependence: Secondary | ICD-10-CM

## 2023-03-23 DIAGNOSIS — J432 Centrilobular emphysema: Secondary | ICD-10-CM | POA: Diagnosis not present

## 2023-03-23 DIAGNOSIS — I2510 Atherosclerotic heart disease of native coronary artery without angina pectoris: Secondary | ICD-10-CM | POA: Diagnosis not present

## 2023-03-23 DIAGNOSIS — F17210 Nicotine dependence, cigarettes, uncomplicated: Secondary | ICD-10-CM | POA: Diagnosis not present

## 2023-03-23 DIAGNOSIS — I771 Stricture of artery: Secondary | ICD-10-CM | POA: Diagnosis not present

## 2023-03-23 DIAGNOSIS — Z122 Encounter for screening for malignant neoplasm of respiratory organs: Secondary | ICD-10-CM

## 2023-03-25 ENCOUNTER — Other Ambulatory Visit: Payer: Self-pay | Admitting: Acute Care

## 2023-03-25 DIAGNOSIS — Z87891 Personal history of nicotine dependence: Secondary | ICD-10-CM

## 2023-03-25 DIAGNOSIS — Z122 Encounter for screening for malignant neoplasm of respiratory organs: Secondary | ICD-10-CM

## 2023-03-25 DIAGNOSIS — F17210 Nicotine dependence, cigarettes, uncomplicated: Secondary | ICD-10-CM

## 2023-04-21 ENCOUNTER — Encounter: Payer: Self-pay | Admitting: Internal Medicine

## 2023-04-21 ENCOUNTER — Ambulatory Visit (INDEPENDENT_AMBULATORY_CARE_PROVIDER_SITE_OTHER): Payer: 59 | Admitting: Internal Medicine

## 2023-04-21 ENCOUNTER — Ambulatory Visit (INDEPENDENT_AMBULATORY_CARE_PROVIDER_SITE_OTHER)
Admission: RE | Admit: 2023-04-21 | Discharge: 2023-04-21 | Disposition: A | Discharge status: Home / Self Care | Payer: 59 | Source: Ambulatory Visit | Attending: Internal Medicine | Admitting: Internal Medicine

## 2023-04-21 VITALS — BP 122/74 | HR 89 | Temp 98.5°F | Ht 66.25 in | Wt 193.0 lb

## 2023-04-21 DIAGNOSIS — R109 Unspecified abdominal pain: Secondary | ICD-10-CM | POA: Diagnosis not present

## 2023-04-21 DIAGNOSIS — R1010 Upper abdominal pain, unspecified: Secondary | ICD-10-CM

## 2023-04-21 NOTE — Progress Notes (Signed)
Subjective:    Patient ID: Kurt [name], male    DOB: 1956/08/18, 67 y.o.   MRN: [id number]  HPI Here due to abdominal pain  "Stomachache" for the past 3 days Has pain from epigastrium to [facility] with deep breath--then it expands out from there Pain is constant---not really eating much (doesn't affect it) Temp 101 yesterday Some chills No N/V Diarrhea --liquid stools since this started (2-3 per day till today--now already 5). No blood May have slight relief of pain briefly after the stools  Tried tums at first---didn't help Yesterday tried pepto bismol--didn't help  Travelled to [place] and [place]---but symptoms didn't start till days after No suspect foods Roommate got sick today---vomited  Current Outpatient Medications on File Prior to Visit  Medication Sig Dispense Refill   albuterol (PROVENTIL) (2.5 MG/3ML) 0.083% nebulizer solution Take 3 mLs (2.5 mg total) by nebulization every 6 (six) hours as needed for wheezing or shortness of breath. 360 mL 0   albuterol (VENTOLIN HFA) 108 (90 Base) MCG/ACT inhaler INHALE 2 PUFFS EVERY 4 HOURS AS NEEDED 18 g 5   aspirin 81 MG EC tablet Take 1 tablet by mouth daily at 2 PM.     folic acid (FOLVITE) 1 MG tablet Take 1 tablet by mouth once daily 90 tablet 3   lisinopril (ZESTRIL) 5 MG tablet Take 1 tablet by mouth once daily 90 tablet 3   methotrexate (RHEUMATREX) 2.5 MG tablet Take 17.5 mg by mouth once a week. Caution:Chemotherapy. Protect from light.     metoprolol tartrate (LOPRESSOR) 50 MG tablet Take 1 tablet by mouth twice daily 180 tablet 3   Multiple Vitamin (MULTIVITAMIN WITH MINERALS) TABS tablet Take 1 tablet by mouth daily. 30 tablet 0   nicotine (NICODERM CQ - DOSED IN MG/24 HOURS) 21 mg/24hr patch Place 1 patch (21 mg total) onto the skin daily. 28 patch 0   rosuvastatin (CRESTOR) 40 MG tablet TAKE 1 TABLET BY MOUTH ONCE DAILY AT 6PM 90 tablet 3   TRELEGY ELLIPTA 100-62.5-25 MCG/ACT AEPB Inhale 1 puff  into the lungs daily.     No current facility-administered medications on file prior to visit.    No Known Allergies  Past Medical History:  Diagnosis Date   COPD (chronic obstructive pulmonary disease) (HCC)    ED (erectile dysfunction)    Hypertension    Lumbar disc disease    Myocardial infarct (HCC)    TB lung, latent    Took a pill for 1 year in 1977    Past Surgical History:  Procedure Laterality Date   APPENDECTOMY     CATARACT EXTRACTION W/PHACO Right 04/15/2021   Procedure: CATARACT EXTRACTION PHACO AND INTRAOCULAR LENS PLACEMENT ([facility]) RIGHT;  Surgeon: Galen Manila, MD;  Location: [facility];  Service: Ophthalmology;  Laterality: Right;  7.82 0:42.9   CORONARY STENT INTERVENTION N/A 11/23/2019   Procedure: CORONARY STENT INTERVENTION;  Surgeon: Alwyn Pea, MD;  Location: [facility];  Service: Cardiovascular;  Laterality: N/A;  RCA   CORONARY/GRAFT ACUTE MI REVASCULARIZATION N/A 11/23/2019   Procedure: Coronary/Graft Acute MI Revascularization;  Surgeon: Alwyn Pea, MD;  Location: [facility];  Service: Cardiovascular;  Laterality: N/A;   LEFT HEART CATH AND CORONARY ANGIOGRAPHY N/A 11/23/2019   Procedure: LEFT HEART CATH AND CORONARY ANGIOGRAPHY;  Surgeon: Alwyn Pea, MD;  Location: [facility];  Service: Cardiovascular;  Laterality: N/A;    Family History  Problem Relation Age of Onset  Hyperlipidemia Father    Cancer Father    Heart disease Father        heart valve disease (from Agent Orange?)   Alcohol abuse Mother    Hypertension Neg Hx    Diabetes Neg Hx    Colon cancer Neg Hx    Colon polyps Neg Hx    Pancreatic cancer Neg Hx    Rectal cancer Neg Hx    Stomach cancer Neg Hx     Social History   Socioeconomic History   Marital status: Widowed    Spouse name: Not on file   Number of children: 1   Years of education: Not on file   Highest education level: Not on file  Occupational  History   Occupation: disabled  Tobacco Use   Smoking status: Every Day    Packs/day: 1.00    Years: 46.00    Additional pack years: 0.00    Total pack years: 46.00    Types: Cigarettes    Passive exposure: Past   Smokeless tobacco: Never  Vaping Use   Vaping Use: Former   Quit date: 11/30/2016  Substance and Sexual Activity   Alcohol use: Yes    Alcohol/week: 4.0 standard drinks of alcohol    Types: 4 Standard drinks or equivalent per week   Drug use: Yes    Types: Marijuana   Sexual activity: Not on file  Other Topics Concern   Not on file  Social History Narrative   Widowed 2020   Social Determinants of Health   Financial Resource Strain: Not on file  Food Insecurity: Not on file  Transportation Needs: Not on file  Physical Activity: Not on file  Stress: Not on file  Social Connections: Not on file  Intimate Partner Violence: Not on file   Review of Systems No dysuria Is able to drink water okay Chronic cough and dyspnea--no change from usual     Objective:   Physical Exam Constitutional:      Appearance: He is well-developed.  Cardiovascular:     Rate and Rhythm: Normal rate and regular rhythm.     Heart sounds: No murmur heard.    No gallop.  Pulmonary:     Effort: Pulmonary effort is normal.     Breath sounds: No wheezing or rales.     Comments: Decreased breath sounds but clear  Abdominal:     Palpations: Abdomen is soft.     Comments: Mild epigastric distention Decreased bowel sounds No sig tenderness/no rebound or guarding  Musculoskeletal:     Cervical back: Neck supple.  Lymphadenopathy:     Cervical: No cervical adenopathy.  Neurological:     Mental Status: He is alert.            Assessment & Plan:

## 2023-04-21 NOTE — Assessment & Plan Note (Addendum)
Troubling pain---despite diarrhea, the picture is not like gastroenteritis (though still possible) Not really consistent with cholecystitis, gastritis or pancreatitis No LLQ pain--not diverticulitis ?some degree of ileus?? Will check abd films  Has some distention and gas in colon---but no obstructive picture  Will continue keeping up with fluids and eating as tolerated If pain worsens, to ER for further evaluation

## 2023-05-03 ENCOUNTER — Other Ambulatory Visit: Payer: Self-pay | Admitting: Internal Medicine

## 2023-05-11 ENCOUNTER — Other Ambulatory Visit: Payer: Self-pay | Admitting: Internal Medicine

## 2023-05-20 DIAGNOSIS — I2510 Atherosclerotic heart disease of native coronary artery without angina pectoris: Secondary | ICD-10-CM | POA: Diagnosis not present

## 2023-05-20 DIAGNOSIS — E782 Mixed hyperlipidemia: Secondary | ICD-10-CM | POA: Diagnosis not present

## 2023-05-20 DIAGNOSIS — R0609 Other forms of dyspnea: Secondary | ICD-10-CM | POA: Diagnosis not present

## 2023-05-20 DIAGNOSIS — I469 Cardiac arrest, cause unspecified: Secondary | ICD-10-CM | POA: Diagnosis not present

## 2023-05-20 DIAGNOSIS — Z720 Tobacco use: Secondary | ICD-10-CM | POA: Diagnosis not present

## 2023-05-20 DIAGNOSIS — J4489 Other specified chronic obstructive pulmonary disease: Secondary | ICD-10-CM | POA: Diagnosis not present

## 2023-05-20 DIAGNOSIS — I10 Essential (primary) hypertension: Secondary | ICD-10-CM | POA: Diagnosis not present

## 2023-05-20 DIAGNOSIS — E669 Obesity, unspecified: Secondary | ICD-10-CM | POA: Diagnosis not present

## 2023-05-20 DIAGNOSIS — Z955 Presence of coronary angioplasty implant and graft: Secondary | ICD-10-CM | POA: Diagnosis not present

## 2023-06-10 DIAGNOSIS — J449 Chronic obstructive pulmonary disease, unspecified: Secondary | ICD-10-CM | POA: Diagnosis not present

## 2023-06-10 DIAGNOSIS — R0602 Shortness of breath: Secondary | ICD-10-CM | POA: Diagnosis not present

## 2023-07-07 DIAGNOSIS — M0579 Rheumatoid arthritis with rheumatoid factor of multiple sites without organ or systems involvement: Secondary | ICD-10-CM | POA: Diagnosis not present

## 2023-07-07 DIAGNOSIS — Z7960 Long term (current) use of unspecified immunomodulators and immunosuppressants: Secondary | ICD-10-CM | POA: Diagnosis not present

## 2023-07-07 DIAGNOSIS — R768 Other specified abnormal immunological findings in serum: Secondary | ICD-10-CM | POA: Diagnosis not present

## 2023-10-13 ENCOUNTER — Encounter: Payer: Self-pay | Admitting: Internal Medicine

## 2023-11-05 DIAGNOSIS — M069 Rheumatoid arthritis, unspecified: Secondary | ICD-10-CM | POA: Diagnosis not present

## 2023-11-05 DIAGNOSIS — D529 Folate deficiency anemia, unspecified: Secondary | ICD-10-CM | POA: Diagnosis not present

## 2023-11-05 DIAGNOSIS — Z8249 Family history of ischemic heart disease and other diseases of the circulatory system: Secondary | ICD-10-CM | POA: Diagnosis not present

## 2023-11-05 DIAGNOSIS — J449 Chronic obstructive pulmonary disease, unspecified: Secondary | ICD-10-CM | POA: Diagnosis not present

## 2023-11-05 DIAGNOSIS — E785 Hyperlipidemia, unspecified: Secondary | ICD-10-CM | POA: Diagnosis not present

## 2023-11-05 DIAGNOSIS — I10 Essential (primary) hypertension: Secondary | ICD-10-CM | POA: Diagnosis not present

## 2023-11-05 DIAGNOSIS — Z87891 Personal history of nicotine dependence: Secondary | ICD-10-CM | POA: Diagnosis not present

## 2023-11-05 DIAGNOSIS — Z809 Family history of malignant neoplasm, unspecified: Secondary | ICD-10-CM | POA: Diagnosis not present

## 2023-11-05 DIAGNOSIS — Z7951 Long term (current) use of inhaled steroids: Secondary | ICD-10-CM | POA: Diagnosis not present

## 2023-11-05 DIAGNOSIS — D84821 Immunodeficiency due to drugs: Secondary | ICD-10-CM | POA: Diagnosis not present

## 2023-11-05 DIAGNOSIS — I2510 Atherosclerotic heart disease of native coronary artery without angina pectoris: Secondary | ICD-10-CM | POA: Diagnosis not present

## 2023-11-05 DIAGNOSIS — E669 Obesity, unspecified: Secondary | ICD-10-CM | POA: Diagnosis not present

## 2023-11-09 ENCOUNTER — Other Ambulatory Visit: Payer: Self-pay | Admitting: Internal Medicine

## 2023-11-09 DIAGNOSIS — Z7960 Long term (current) use of unspecified immunomodulators and immunosuppressants: Secondary | ICD-10-CM | POA: Diagnosis not present

## 2023-11-09 DIAGNOSIS — M0579 Rheumatoid arthritis with rheumatoid factor of multiple sites without organ or systems involvement: Secondary | ICD-10-CM | POA: Diagnosis not present

## 2023-11-16 DIAGNOSIS — M1990 Unspecified osteoarthritis, unspecified site: Secondary | ICD-10-CM | POA: Diagnosis not present

## 2023-11-16 DIAGNOSIS — I10 Essential (primary) hypertension: Secondary | ICD-10-CM | POA: Diagnosis not present

## 2023-11-16 DIAGNOSIS — Z7951 Long term (current) use of inhaled steroids: Secondary | ICD-10-CM | POA: Diagnosis not present

## 2023-11-16 DIAGNOSIS — I252 Old myocardial infarction: Secondary | ICD-10-CM | POA: Diagnosis not present

## 2023-11-16 DIAGNOSIS — F17210 Nicotine dependence, cigarettes, uncomplicated: Secondary | ICD-10-CM | POA: Diagnosis not present

## 2023-11-16 DIAGNOSIS — N529 Male erectile dysfunction, unspecified: Secondary | ICD-10-CM | POA: Diagnosis not present

## 2023-11-16 DIAGNOSIS — E669 Obesity, unspecified: Secondary | ICD-10-CM | POA: Diagnosis not present

## 2023-11-16 DIAGNOSIS — I2510 Atherosclerotic heart disease of native coronary artery without angina pectoris: Secondary | ICD-10-CM | POA: Diagnosis not present

## 2023-11-16 DIAGNOSIS — M069 Rheumatoid arthritis, unspecified: Secondary | ICD-10-CM | POA: Diagnosis not present

## 2023-11-16 DIAGNOSIS — E785 Hyperlipidemia, unspecified: Secondary | ICD-10-CM | POA: Diagnosis not present

## 2023-11-16 DIAGNOSIS — G629 Polyneuropathy, unspecified: Secondary | ICD-10-CM | POA: Diagnosis not present

## 2023-11-16 DIAGNOSIS — J439 Emphysema, unspecified: Secondary | ICD-10-CM | POA: Diagnosis not present

## 2023-11-25 DIAGNOSIS — J4489 Other specified chronic obstructive pulmonary disease: Secondary | ICD-10-CM | POA: Diagnosis not present

## 2023-11-25 DIAGNOSIS — K219 Gastro-esophageal reflux disease without esophagitis: Secondary | ICD-10-CM | POA: Diagnosis not present

## 2023-11-25 DIAGNOSIS — I10 Essential (primary) hypertension: Secondary | ICD-10-CM | POA: Diagnosis not present

## 2023-11-25 DIAGNOSIS — Z955 Presence of coronary angioplasty implant and graft: Secondary | ICD-10-CM | POA: Diagnosis not present

## 2023-11-25 DIAGNOSIS — Z720 Tobacco use: Secondary | ICD-10-CM | POA: Diagnosis not present

## 2023-11-25 DIAGNOSIS — E782 Mixed hyperlipidemia: Secondary | ICD-10-CM | POA: Diagnosis not present

## 2023-11-25 DIAGNOSIS — I469 Cardiac arrest, cause unspecified: Secondary | ICD-10-CM | POA: Diagnosis not present

## 2023-11-25 DIAGNOSIS — R0602 Shortness of breath: Secondary | ICD-10-CM | POA: Diagnosis not present

## 2023-11-25 DIAGNOSIS — J449 Chronic obstructive pulmonary disease, unspecified: Secondary | ICD-10-CM | POA: Diagnosis not present

## 2023-11-25 DIAGNOSIS — R0609 Other forms of dyspnea: Secondary | ICD-10-CM | POA: Diagnosis not present

## 2023-11-25 DIAGNOSIS — I2510 Atherosclerotic heart disease of native coronary artery without angina pectoris: Secondary | ICD-10-CM | POA: Diagnosis not present

## 2023-11-25 DIAGNOSIS — E66811 Obesity, class 1: Secondary | ICD-10-CM | POA: Diagnosis not present

## 2023-12-10 ENCOUNTER — Other Ambulatory Visit: Payer: Self-pay | Admitting: Internal Medicine

## 2024-01-31 ENCOUNTER — Encounter: Payer: Self-pay | Admitting: Internal Medicine

## 2024-01-31 ENCOUNTER — Ambulatory Visit (INDEPENDENT_AMBULATORY_CARE_PROVIDER_SITE_OTHER): Payer: Medicare HMO | Admitting: Internal Medicine

## 2024-01-31 VITALS — BP 128/86 | HR 68 | Temp 98.6°F | Ht 67.0 in | Wt 194.0 lb

## 2024-01-31 DIAGNOSIS — Z125 Encounter for screening for malignant neoplasm of prostate: Secondary | ICD-10-CM | POA: Diagnosis not present

## 2024-01-31 DIAGNOSIS — I2510 Atherosclerotic heart disease of native coronary artery without angina pectoris: Secondary | ICD-10-CM

## 2024-01-31 DIAGNOSIS — J449 Chronic obstructive pulmonary disease, unspecified: Secondary | ICD-10-CM | POA: Diagnosis not present

## 2024-01-31 DIAGNOSIS — F17210 Nicotine dependence, cigarettes, uncomplicated: Secondary | ICD-10-CM | POA: Diagnosis not present

## 2024-01-31 DIAGNOSIS — M05741 Rheumatoid arthritis with rheumatoid factor of right hand without organ or systems involvement: Secondary | ICD-10-CM

## 2024-01-31 DIAGNOSIS — M069 Rheumatoid arthritis, unspecified: Secondary | ICD-10-CM | POA: Insufficient documentation

## 2024-01-31 DIAGNOSIS — Z0000 Encounter for general adult medical examination without abnormal findings: Secondary | ICD-10-CM | POA: Diagnosis not present

## 2024-01-31 DIAGNOSIS — M5432 Sciatica, left side: Secondary | ICD-10-CM | POA: Insufficient documentation

## 2024-01-31 DIAGNOSIS — M05742 Rheumatoid arthritis with rheumatoid factor of left hand without organ or systems involvement: Secondary | ICD-10-CM

## 2024-01-31 LAB — CBC
HCT: 46.3 % (ref 39.0–52.0)
Hemoglobin: 15.1 g/dL (ref 13.0–17.0)
MCHC: 32.7 g/dL (ref 30.0–36.0)
MCV: 105.1 fl — ABNORMAL HIGH (ref 78.0–100.0)
Platelets: 208 10*3/uL (ref 150.0–400.0)
RBC: 4.4 Mil/uL (ref 4.22–5.81)
RDW: 15.7 % — ABNORMAL HIGH (ref 11.5–15.5)
WBC: 6.7 10*3/uL (ref 4.0–10.5)

## 2024-01-31 LAB — COMPREHENSIVE METABOLIC PANEL
ALT: 36 U/L (ref 0–53)
AST: 26 U/L (ref 0–37)
Albumin: 4 g/dL (ref 3.5–5.2)
Alkaline Phosphatase: 68 U/L (ref 39–117)
BUN: 17 mg/dL (ref 6–23)
CO2: 30 meq/L (ref 19–32)
Calcium: 9.4 mg/dL (ref 8.4–10.5)
Chloride: 105 meq/L (ref 96–112)
Creatinine, Ser: 1.03 mg/dL (ref 0.40–1.50)
GFR: 76.29 mL/min (ref >60.00)
Glucose, Bld: 94 mg/dL (ref 70–99)
Potassium: 4.5 meq/L (ref 3.5–5.1)
Sodium: 143 meq/L (ref 135–145)
Total Bilirubin: 0.5 mg/dL (ref 0.2–1.2)
Total Protein: 6.7 g/dL (ref 6.0–8.3)

## 2024-01-31 LAB — LIPID PANEL
Cholesterol: 119 mg/dL (ref 0–200)
HDL: 36.7 mg/dL — ABNORMAL LOW (ref >39.00)
LDL Cholesterol: 22 mg/dL (ref 0–99)
NonHDL: 82.35
Total CHOL/HDL Ratio: 3
Triglycerides: 301 mg/dL — ABNORMAL HIGH (ref 0.0–149.0)
VLDL: 60.2 mg/dL — ABNORMAL HIGH (ref 0.0–40.0)

## 2024-01-31 LAB — PSA, MEDICARE: PSA: 3.72 ng/mL (ref 0.10–4.00)

## 2024-01-31 MED ORDER — PREDNISONE 20 MG PO TABS: 40.0000 mg | ORAL_TABLET | Freq: Every day | ORAL | 0 refills | Status: DC

## 2024-01-31 NOTE — Assessment & Plan Note (Signed)
 Will try short course of prednsione

## 2024-01-31 NOTE — Addendum Note (Signed)
 Addended by: Tillman Abide I on: 01/31/2024 08:57 AM   Modules accepted: Orders

## 2024-01-31 NOTE — Assessment & Plan Note (Signed)
 Better since trelegy Albuterol prn Needs aorta screen

## 2024-01-31 NOTE — Progress Notes (Signed)
 Hearing Screening  Method: Audiometry   500Hz  1000Hz  2000Hz  4000Hz   Right ear 20 20 20 20   Left ear 20 20 20 20   Comments: Passed whisper test.   Vision Screening   Right eye Left eye Both eyes  Without correction 20/30 20/40 20/20   With correction

## 2024-01-31 NOTE — Assessment & Plan Note (Signed)
 No angina On ASA, lisinopril 5mg , metoprolol 50 bid, rosuvastatin 40

## 2024-01-31 NOTE — Assessment & Plan Note (Signed)
 Does okay with methotrexate 17.5mg  weekly  Also on folic acid

## 2024-01-31 NOTE — Assessment & Plan Note (Signed)
 I have personally reviewed the Medicare Annual Wellness questionnaire and have noted 1. The patient's medical and social history 2. Their use of alcohol, tobacco or illicit drugs 3. Their current medications and supplements 4. The patient's functional ability including ADL's, fall risks, home safety risks and hearing or visual             impairment. 5. Diet and physical activities 6. Evidence for depression or mood disorders  The patients weight, height, BMI and visual acuity have been recorded in the chart I have made referrals, counseling and provided education to the patient based review of the above and I have provided the pt with a written personalized care plan for preventive services.  I have provided you with a copy of your personalized plan for preventive services. Please take the time to review along with your updated medication list.  Due for colonoscopy--has had recall. He will call to set up Check PSA Does do yard work--limited ability to exercise Had prevnar Should get RSV, flu, COVID next fall Urged him to stop smoking

## 2024-01-31 NOTE — Progress Notes (Signed)
 Subjective:    Patient ID: Kurt [name], male    DOB: [birth year], 68 y.o.   MRN: [id number]  HPI Here for Welcome to Medicare visit and follow up of chronic health conditions Reviewed advanced directives Reviewed other doctors----Dr Fleming--pulmonary, Dr Callwood--cardiology, Dr Patel--rheumatology, Dr Thomasene Lot, Dr Brennan Bailey No hospitalizations or surgery in the past year Still smoking---uses patch to reduce cigarette use (will stop for some days) Still enjoys whiskey---discussed amount Keeps up house/yard--no set exercise Vision is okay Hearing is okay No falls No depression or anhedonia Independent with instrumental ADLs No sig memory issues  No chest pain No palpitations No dizziness or syncope No edema  Breathing is not great--fairly stable DOE (like vacuuming house---will need inhaler) No regular wheezing since on trelegy No regular cough  Has pain in left lateral thigh---and down into calf Notices it all the time Dull type of pain Improves with walking--worse if sitting No back pain  Methotrexate works for Kurt [name] control  Current Outpatient Medications on File Prior to Visit  Medication Sig Dispense Refill   albuterol (PROVENTIL) (2.5 MG/3ML) 0.083% nebulizer solution Take 3 mLs (2.5 mg total) by nebulization every 6 (six) hours as needed for wheezing or shortness of breath. 360 mL 0   albuterol (VENTOLIN HFA) 108 (90 Base) MCG/ACT inhaler INHALE 2 PUFFS BY MOUTH EVERY 4 HOURS AS NEEDED 18 g 1   aspirin 81 MG EC tablet Take 1 tablet by mouth daily at 2 PM.     folic acid (FOLVITE) 1 MG tablet Take 1 tablet by mouth once daily 90 tablet 3   lisinopril (ZESTRIL) 5 MG tablet Take 1 tablet by mouth once daily 90 tablet 3   methotrexate (RHEUMATREX) 2.5 MG tablet Take 17.5 mg by mouth once a week. Caution:Chemotherapy. Protect from light.     metoprolol tartrate (LOPRESSOR) 50 MG tablet Take 1 tablet by mouth twice daily 180 tablet 3   Multiple Vitamin  (MULTIVITAMIN WITH MINERALS) TABS tablet Take 1 tablet by mouth daily. 30 tablet 0   nicotine (NICODERM CQ - DOSED IN MG/24 HOURS) 21 mg/24hr patch Place 1 patch (21 mg total) onto the skin daily. 28 patch 0   rosuvastatin (CRESTOR) 40 MG tablet TAKE 1 TABLET BY MOUTH ONCE DAILY AT  6PM 90 tablet 3   traZODone (DESYREL) 50 MG tablet Take 50 mg by mouth at bedtime.     TRELEGY ELLIPTA 100-62.5-25 MCG/ACT AEPB Inhale 1 puff into the lungs daily.     No current facility-administered medications on file prior to visit.    No Known Allergies  Past Medical History:  Diagnosis Date   COPD (chronic obstructive pulmonary disease) (HCC)    ED (erectile dysfunction)    Hypertension    Lumbar disc disease    Myocardial infarct (HCC)    TB lung, latent    Took a pill for 1 year in 1977    Past Surgical History:  Procedure Laterality Date   APPENDECTOMY     CATARACT EXTRACTION W/PHACO Right 04/15/2021   Procedure: CATARACT EXTRACTION PHACO AND INTRAOCULAR LENS PLACEMENT (IOC) RIGHT;  Surgeon: Galen Manila, MD;  Location: [facility];  Service: Ophthalmology;  Laterality: Right;  7.82 0:42.9   CORONARY STENT INTERVENTION N/A 11/23/2019   Procedure: CORONARY STENT INTERVENTION;  Surgeon: Alwyn Pea, MD;  Location: [facility];  Service: Cardiovascular;  Laterality: N/A;  RCA   CORONARY/GRAFT ACUTE MI REVASCULARIZATION N/A 11/23/2019   Procedure: Coronary/Graft Acute MI Revascularization;  Surgeon: Dorothyann Peng  D, MD;  Location: [facility];  Service: Cardiovascular;  Laterality: N/A;   LEFT HEART CATH AND CORONARY ANGIOGRAPHY N/A 11/23/2019   Procedure: LEFT HEART CATH AND CORONARY ANGIOGRAPHY;  Surgeon: Alwyn Pea, MD;  Location: [facility];  Service: Cardiovascular;  Laterality: N/A;    Family History  Problem Relation Age of Onset   Hyperlipidemia Father    Cancer Father    Heart disease Father        heart valve disease (from  Agent Orange?)   Alcohol abuse Mother    Hypertension Neg Hx    Diabetes Neg Hx    Colon cancer Neg Hx    Colon polyps Neg Hx    Pancreatic cancer Neg Hx    Rectal cancer Neg Hx    Stomach cancer Neg Hx     Social History   Socioeconomic History   Marital status: Widowed    Spouse name: Not on file   Number of children: 1   Years of education: Not on file   Highest education level: Not on file  Occupational History   Occupation: disabled  Tobacco Use   Smoking status: Every Day    Current packs/day: 1.00    Average packs/day: 1 pack/day for 46.0 years (46.0 ttl pk-yrs)    Types: Cigarettes    Passive exposure: Past   Smokeless tobacco: Never  Vaping Use   Vaping status: Former   Quit date: 11/30/2016  Substance and Sexual Activity   Alcohol use: Yes    Alcohol/week: 4.0 standard drinks of alcohol    Types: 4 Standard drinks or equivalent per week   Drug use: Yes    Types: Marijuana   Sexual activity: Not on file  Other Topics Concern   Not on file  Social History Narrative   Widowed 2020      No health care directive   Son would be health care POA   Would accept resuscitation   Not sure about life support--but not if cognitively unaware   Social Drivers of Health   Financial Resource Strain: Not on file  Food Insecurity: Not on file  Transportation Needs: Not on file  Physical Activity: Not on file  Stress: Not on file  Social Connections: Unknown (04/14/2022)   Received from [facility]   Social Network    Social Network: Not on file  Intimate Partner Violence: Unknown (03/06/2022)   Received from [facility]   HITS    Physically Hurt: Not on file    Insult or Talk Down To: Not on file    Threaten Physical Harm: Not on file    Scream or Curse: Not on file   Review of Systems Appetite is good Weight up a few pounds Doesn't sleep well--only about 3 hours at a time. Watches TV than falls asleep again Wears seat  belt Teeth are bad--may need dentures soon. Plans to change to [employer] Nocturia x 1--voids okay in day (stream is fine) No heartburn--or dysphagia Bowels move fine--no blood No sig back or joint pains No suspicious skin lesions    Objective:   Physical Exam Constitutional:      Appearance: Normal appearance.  HENT:     Mouth/Throat:     Pharynx: No oropharyngeal exudate or posterior oropharyngeal erythema.  Eyes:     Conjunctiva/sclera: Conjunctivae normal.     Pupils: Pupils are equal, round, and reactive to light.  Cardiovascular:     Rate  and Rhythm: Normal rate and regular rhythm.     Pulses: Normal pulses.     Heart sounds: No murmur heard.    No gallop.  Pulmonary:     Effort: Pulmonary effort is normal.     Breath sounds: No wheezing or rales.     Comments: Decreased breath sounds but clear Abdominal:     Palpations: Abdomen is soft.     Tenderness: There is no abdominal tenderness.  Musculoskeletal:     Cervical back: Neck supple.     Right lower leg: No edema.     Left lower leg: No edema.  Lymphadenopathy:     Cervical: No cervical adenopathy.  Skin:    Findings: No lesion or rash.  Neurological:     General: No focal deficit present.     Mental Status: He is alert and oriented to person, place, and time.     Comments: Mini-cog---normal  Psychiatric:        Mood and Affect: Mood normal.        Behavior: Behavior normal.            Assessment & Plan:

## 2024-02-03 ENCOUNTER — Ambulatory Visit
Admission: RE | Admit: 2024-02-03 | Discharge: 2024-02-03 | Disposition: A | Discharge status: Home / Self Care | Source: Ambulatory Visit | Attending: Internal Medicine | Admitting: Internal Medicine

## 2024-02-03 DIAGNOSIS — Z136 Encounter for screening for cardiovascular disorders: Secondary | ICD-10-CM | POA: Insufficient documentation

## 2024-02-03 DIAGNOSIS — F17210 Nicotine dependence, cigarettes, uncomplicated: Secondary | ICD-10-CM | POA: Diagnosis not present

## 2024-02-03 DIAGNOSIS — J449 Chronic obstructive pulmonary disease, unspecified: Secondary | ICD-10-CM | POA: Insufficient documentation

## 2024-02-03 DIAGNOSIS — Z87891 Personal history of nicotine dependence: Secondary | ICD-10-CM | POA: Diagnosis not present

## 2024-02-07 ENCOUNTER — Other Ambulatory Visit: Payer: Self-pay | Admitting: Internal Medicine

## 2024-02-15 ENCOUNTER — Other Ambulatory Visit: Payer: Self-pay | Admitting: Internal Medicine

## 2024-03-02 ENCOUNTER — Other Ambulatory Visit: Payer: Self-pay | Admitting: Specialist

## 2024-03-02 DIAGNOSIS — F17210 Nicotine dependence, cigarettes, uncomplicated: Secondary | ICD-10-CM

## 2024-03-13 DIAGNOSIS — M0579 Rheumatoid arthritis with rheumatoid factor of multiple sites without organ or systems involvement: Secondary | ICD-10-CM | POA: Diagnosis not present

## 2024-03-13 DIAGNOSIS — Z7960 Long term (current) use of unspecified immunomodulators and immunosuppressants: Secondary | ICD-10-CM | POA: Diagnosis not present

## 2024-03-15 ENCOUNTER — Other Ambulatory Visit (HOSPITAL_COMMUNITY): Payer: Self-pay

## 2024-04-10 ENCOUNTER — Other Ambulatory Visit: Payer: Self-pay | Admitting: Internal Medicine

## 2024-04-13 ENCOUNTER — Encounter: Payer: Self-pay | Admitting: Pharmacist

## 2024-04-13 NOTE — Progress Notes (Signed)
 Pharmacy Quality Measure Review  This patient is appearing on a report for being at risk of failing the adherence measure for hypertension (ACEi/ARB) medications this calendar year.   Medication: lisinopril  5 mg Last fill date: 01/17/24 for 30 day supply  Medication refill has been sent.  Patient has refilled this medication as of 04/03/24 for 90 day supply

## 2024-04-28 ENCOUNTER — Other Ambulatory Visit: Payer: Self-pay | Admitting: *Deleted

## 2024-04-28 DIAGNOSIS — Z87891 Personal history of nicotine dependence: Secondary | ICD-10-CM

## 2024-04-28 DIAGNOSIS — F17210 Nicotine dependence, cigarettes, uncomplicated: Secondary | ICD-10-CM

## 2024-04-28 DIAGNOSIS — Z122 Encounter for screening for malignant neoplasm of respiratory organs: Secondary | ICD-10-CM

## 2024-05-03 ENCOUNTER — Ambulatory Visit
Admission: RE | Admit: 2024-05-03 | Discharge: 2024-05-03 | Disposition: A | Discharge status: Home / Self Care | Source: Ambulatory Visit | Attending: Acute Care | Admitting: Acute Care

## 2024-05-03 DIAGNOSIS — Z87891 Personal history of nicotine dependence: Secondary | ICD-10-CM

## 2024-05-03 DIAGNOSIS — Z122 Encounter for screening for malignant neoplasm of respiratory organs: Secondary | ICD-10-CM

## 2024-05-03 DIAGNOSIS — F17210 Nicotine dependence, cigarettes, uncomplicated: Secondary | ICD-10-CM | POA: Diagnosis not present

## 2024-05-03 DIAGNOSIS — R911 Solitary pulmonary nodule: Secondary | ICD-10-CM | POA: Diagnosis not present

## 2024-05-10 ENCOUNTER — Encounter: Payer: Self-pay | Admitting: Pharmacist

## 2024-05-10 NOTE — Progress Notes (Signed)
 Pharmacy Quality Measure Review  This patient is appearing on a report for being at risk of failing the adherence measure for hypertension (ACEi/ARB) medications this calendar year.   Medication: Lisinopril  Medication has been successfully refilled on 04/11/24 for 90 day supply 3 additional 90--ds refills remaining.

## 2024-05-22 ENCOUNTER — Telehealth: Payer: Self-pay

## 2024-05-22 NOTE — Telephone Encounter (Signed)
 Call Report from Tiffany  IMPRESSION: 1. Lung-RADS 4A, suspicious. Follow up low-dose chest CT without contrast in 3 months (please use the following order, CT CHEST LCS NODULE FOLLOW-UP W/O CM) is recommended. Alternatively, PET may be considered when there is a solid component 8mm or larger. 2. Coronary artery calcifications 3. Aortic Atherosclerosis (ICD10-I70.0) and Emphysema (ICD10-J43.9).

## 2024-05-24 NOTE — Telephone Encounter (Signed)
 Attempted to call patient and review results. No VM setup. Will try patient again later.

## 2024-05-24 NOTE — Telephone Encounter (Signed)
 Results reviewed by Lauraine Lites, NP and advised to notify patient that a 3 months follow up LDCT is recommended for a new nodule in the left upper lobe.  Please place new order and fax PCP with updates once patient has been notified.  Other findings were atherosclerosis and emphysema, as previously noted.

## 2024-05-25 ENCOUNTER — Other Ambulatory Visit: Payer: Self-pay

## 2024-05-25 DIAGNOSIS — Z122 Encounter for screening for malignant neoplasm of respiratory organs: Secondary | ICD-10-CM

## 2024-05-25 DIAGNOSIS — R0609 Other forms of dyspnea: Secondary | ICD-10-CM | POA: Diagnosis not present

## 2024-05-25 DIAGNOSIS — Z720 Tobacco use: Secondary | ICD-10-CM | POA: Diagnosis not present

## 2024-05-25 DIAGNOSIS — K219 Gastro-esophageal reflux disease without esophagitis: Secondary | ICD-10-CM | POA: Diagnosis not present

## 2024-05-25 DIAGNOSIS — Z87891 Personal history of nicotine dependence: Secondary | ICD-10-CM

## 2024-05-25 DIAGNOSIS — E66811 Obesity, class 1: Secondary | ICD-10-CM | POA: Diagnosis not present

## 2024-05-25 DIAGNOSIS — I2510 Atherosclerotic heart disease of native coronary artery without angina pectoris: Secondary | ICD-10-CM | POA: Diagnosis not present

## 2024-05-25 DIAGNOSIS — Z955 Presence of coronary angioplasty implant and graft: Secondary | ICD-10-CM | POA: Diagnosis not present

## 2024-05-25 DIAGNOSIS — I10 Essential (primary) hypertension: Secondary | ICD-10-CM | POA: Diagnosis not present

## 2024-05-25 DIAGNOSIS — E782 Mixed hyperlipidemia: Secondary | ICD-10-CM | POA: Diagnosis not present

## 2024-05-25 DIAGNOSIS — J4489 Other specified chronic obstructive pulmonary disease: Secondary | ICD-10-CM | POA: Diagnosis not present

## 2024-05-25 DIAGNOSIS — I469 Cardiac arrest, cause unspecified: Secondary | ICD-10-CM | POA: Diagnosis not present

## 2024-05-25 DIAGNOSIS — R911 Solitary pulmonary nodule: Secondary | ICD-10-CM

## 2024-05-25 NOTE — Telephone Encounter (Signed)
 Spoke with patient and reviewed Lung CT results. Pt is in agreement to complete a 3 month f/u scan to evaluate the new nodule. Scheduled at [facility] on 08/04/2024. Order placed. Results and plan to PCP.

## 2024-06-05 DIAGNOSIS — F17210 Nicotine dependence, cigarettes, uncomplicated: Secondary | ICD-10-CM | POA: Diagnosis not present

## 2024-06-05 DIAGNOSIS — F17218 Nicotine dependence, cigarettes, with other nicotine-induced disorders: Secondary | ICD-10-CM | POA: Diagnosis not present

## 2024-06-05 DIAGNOSIS — R918 Other nonspecific abnormal finding of lung field: Secondary | ICD-10-CM | POA: Diagnosis not present

## 2024-06-05 DIAGNOSIS — J449 Chronic obstructive pulmonary disease, unspecified: Secondary | ICD-10-CM | POA: Diagnosis not present

## 2024-06-05 DIAGNOSIS — R0602 Shortness of breath: Secondary | ICD-10-CM | POA: Diagnosis not present

## 2024-06-13 ENCOUNTER — Other Ambulatory Visit: Payer: Self-pay | Admitting: Internal Medicine

## 2024-06-30 ENCOUNTER — Encounter: Payer: Self-pay | Admitting: Internal Medicine

## 2024-07-09 ENCOUNTER — Other Ambulatory Visit: Payer: Self-pay | Admitting: Internal Medicine

## 2024-07-30 ENCOUNTER — Other Ambulatory Visit: Payer: Self-pay | Admitting: Internal Medicine

## 2024-08-04 ENCOUNTER — Ambulatory Visit
Admission: RE | Admit: 2024-08-04 | Discharge: 2024-08-04 | Disposition: A | Discharge status: Home / Self Care | Source: Ambulatory Visit | Attending: Acute Care | Admitting: Acute Care

## 2024-08-04 DIAGNOSIS — R918 Other nonspecific abnormal finding of lung field: Secondary | ICD-10-CM | POA: Diagnosis not present

## 2024-08-04 DIAGNOSIS — Z87891 Personal history of nicotine dependence: Secondary | ICD-10-CM

## 2024-08-04 DIAGNOSIS — Z122 Encounter for screening for malignant neoplasm of respiratory organs: Secondary | ICD-10-CM

## 2024-08-04 DIAGNOSIS — R911 Solitary pulmonary nodule: Secondary | ICD-10-CM

## 2024-08-10 ENCOUNTER — Telehealth: Payer: Self-pay

## 2024-08-10 ENCOUNTER — Other Ambulatory Visit: Payer: Self-pay

## 2024-08-10 DIAGNOSIS — Z87891 Personal history of nicotine dependence: Secondary | ICD-10-CM

## 2024-08-10 DIAGNOSIS — R911 Solitary pulmonary nodule: Secondary | ICD-10-CM

## 2024-08-10 DIAGNOSIS — Z122 Encounter for screening for malignant neoplasm of respiratory organs: Secondary | ICD-10-CM

## 2024-08-10 DIAGNOSIS — F17210 Nicotine dependence, cigarettes, uncomplicated: Secondary | ICD-10-CM

## 2024-08-10 NOTE — Telephone Encounter (Signed)
 Called patient and reviewed recent Lung CT results. He is in agreement to complete a 6 month follow up scan in March 2026. Request call back closer to due date. Reminder set. Order placed. Pt is on statin therapy. Results and plan to PCP.   IMPRESSION: 1. Lung-RADS 3, probably benign findings. Short-term follow-up in 6 months is recommended with repeat low-dose chest CT without contrast (please use the following order, CT CHEST LCS NODULE FOLLOW-UP W/O CM). Previously noted left upper lobe nodule is decreased in size. 2. Aortic Atherosclerosis (ICD10-I70.0) and Emphysema (ICD10-J43.9). Coronary artery calcifications. Assessment for potential risk factor modification, dietary therapy or pharmacologic therapy may be warranted, if clinically indicated.

## 2024-08-27 ENCOUNTER — Other Ambulatory Visit: Payer: Self-pay | Admitting: Family

## 2024-09-06 ENCOUNTER — Other Ambulatory Visit: Payer: Self-pay

## 2024-09-06 ENCOUNTER — Emergency Department
Admission: EM | Admit: 2024-09-06 | Discharge: 2024-09-06 | Disposition: A | Discharge status: Home / Self Care | Attending: Emergency Medicine | Admitting: Emergency Medicine

## 2024-09-06 ENCOUNTER — Emergency Department

## 2024-09-06 DIAGNOSIS — I10 Essential (primary) hypertension: Secondary | ICD-10-CM | POA: Diagnosis not present

## 2024-09-06 DIAGNOSIS — J449 Chronic obstructive pulmonary disease, unspecified: Secondary | ICD-10-CM | POA: Insufficient documentation

## 2024-09-06 DIAGNOSIS — M25552 Pain in left hip: Secondary | ICD-10-CM | POA: Insufficient documentation

## 2024-09-06 MED ORDER — LIDOCAINE 5 % EX PTCH
1.0000 | MEDICATED_PATCH | CUTANEOUS | Status: DC
  Administered 2024-09-06: 1 via TRANSDERMAL
  Filled 2024-09-06: qty 1

## 2024-09-06 MED ORDER — KETOROLAC TROMETHAMINE 15 MG/ML IJ SOLN
15.0000 mg | Freq: Once | INTRAMUSCULAR | Status: AC
Start: 2024-09-06 — End: 2024-09-06
  Administered 2024-09-06: 15 mg via INTRAMUSCULAR
  Filled 2024-09-06: qty 1

## 2024-09-06 MED ORDER — KETOROLAC TROMETHAMINE 30 MG/ML IJ SOLN: 30.0000 mg | Freq: Once | INTRAMUSCULAR | Status: DC

## 2024-09-06 NOTE — ED Provider Notes (Signed)
 [facility] Emergency Department Provider Note     Event Date/Time   First MD Initiated Contact with Patient 09/06/24 1004     (approximate)   History   Hip Pain   HPI  Kurt [name] is a 68 y.o. male with a past medical history of HTN, COPD presents to the ED for evaluation of left hip pain without known injury or trauma.  No radiation.  Patient reports onset of pain began 7 days ago when he was taking x-rays.  Patient reports the x-ray tech was manipulating his hip in multiple positions.  He describes pain as a bone-on-bone sensation that is better with the more steps he takes. He has been taking Tylenol  with some relief.     Physical Exam   Triage Vital Signs: ED Triage Vitals  Encounter Vitals Group     BP 09/06/24 0937 (!) 176/105     Girls Systolic BP Percentile --      Girls Diastolic BP Percentile --      Boys Systolic BP Percentile --      Boys Diastolic BP Percentile --      Pulse Rate 09/06/24 0935 76     Resp 09/06/24 0935 18     Temp 09/06/24 0935 97.8 F (36.6 C)     Temp Source 09/06/24 0935 Oral     SpO2 09/06/24 0935 96 %     Weight 09/06/24 0934 195 lb (88.5 kg)     Height 09/06/24 0934 5' 6 (1.676 m)     Head Circumference --      Peak Flow --      Pain Score 09/06/24 0934 6     Pain Loc --      Pain Education --      Exclude from Growth Chart --     Most recent vital signs: Vitals:   09/06/24 0935 09/06/24 0937  BP:  (!) 176/105  Pulse: 76   Resp: 18   Temp: 97.8 F (36.6 C)   SpO2: 96%     General Awake, no distress.  HEENT NCAT.  CV:  Good peripheral perfusion.  RESP:  Normal effort.  ABD:  No distention.  Other:  Left hip reveals no visible deformity or color changes.  Mild Tenderness to palpation to lateral left gluteal muscle region.  Limited range of motion with hip flexion secondary to pain.  Neurovascular status intact all throughout.  Gait is steady.   ED Results / Procedures / Treatments    Labs (all labs ordered are listed, but only abnormal results are displayed) Labs Reviewed - No data to display  RADIOLOGY  I personally viewed and evaluated these images as part of my medical decision making, as well as reviewing the written report by the radiologist.  ED Provider Interpretation: Normal left hip x-ray  DG Hip Unilat W or Wo Pelvis 2-3 Views Left Result Date: 09/06/2024 CLINICAL DATA:  Left hip pain. EXAM: DG HIP (WITH OR WITHOUT PELVIS) 2-3V LEFT COMPARISON:  None Available. FINDINGS: No acute osseous or joint abnormality. Hips are unremarkable. Sacroiliac joints are patent. Prostate calcifications. IMPRESSION: No findings to explain the patient's pain. Electronically Signed   By: Newell Eke M.D.   On: 09/06/2024 10:20    PROCEDURES:  Critical Care performed: No  Procedures  MEDICATIONS ORDERED IN ED: Medications  lidocaine  (LIDODERM ) 5 % 1 patch (1 patch Transdermal Patch Applied 09/06/24 1030)  ketorolac  (TORADOL ) 15 MG/ML injection 15 mg (15 mg Intramuscular  Given 09/06/24 1032)    IMPRESSION / MDM / ASSESSMENT AND PLAN / ED COURSE  I reviewed the triage vital signs and the nursing notes.                               68 y.o. male presents to the emergency department for evaluation and treatment of acute nontraumatic left hip pain. See HPI for further details.   Differential diagnosis includes, but is not limited to dislocation, fracture, arthritis, radiculopathy, muscle spasm  Patient's presentation is most consistent with acute complicated illness / injury requiring diagnostic workup.  Patient is alert and oriented.  He is well-appearing on initial assessment.  Physical exam findings are reassuring.  Normal lower extremity strength.  Negative deformity to left hip and patient is able to ambulate steady without assistance. hip xray obtained in triage is reassuring and shows no acute abnormalities.  I suspect presentation is consistent with  osteoarthritis given history that symptoms improve with first 5 steps in the morning and after sitting for a prolonged time.  Patient was offered walker, however states he already has one at home that he is using.  Advised pain control with Tylenol  and NSAID.  He is in stable condition for discharge home.  Advised to follow-up with primary care provider in 1 week.  He agreed with care plan.  ED return precaution discussed.   FINAL CLINICAL IMPRESSION(S) / ED DIAGNOSES   Final diagnoses:  Left hip pain   Rx / DC Orders   ED Discharge Orders     None      Note:  This document was prepared using Dragon voice recognition software and may include unintentional dictation errors.    Margrette, Vyla Pint A, PA-C 09/06/24 1100    Dicky Anes, MD 09/06/24 [phone]

## 2024-09-06 NOTE — ED Triage Notes (Signed)
 Patient states left hip pain if he sits too long; states this started in October after xray tech was twisting him around on table. Patient denies injury and/or trauma to left hip.

## 2024-09-06 NOTE — Discharge Instructions (Signed)
 Your hip x-rays are normal.  Please follow-up with your primary care provider for further management.  Consider low impact range of motion exercises.  Apply a warm heating pad to the affected area this may help with pain.  Pain control:  Ibuprofen  (motrin /aleve/advil ) - You can take 3 tablets (600 mg) every 6 hours as needed for pain/fever.  Acetaminophen  (tylenol ) - You can take 2 extra strength tablets (1000 mg) every 6 hours as needed for pain/fever.  You can alternate these medications or take them together.  Make sure you eat food/drink water  when taking these medications.

## 2024-09-13 DIAGNOSIS — M0579 Rheumatoid arthritis with rheumatoid factor of multiple sites without organ or systems involvement: Secondary | ICD-10-CM | POA: Diagnosis not present

## 2024-09-17 DIAGNOSIS — Z1212 Encounter for screening for malignant neoplasm of rectum: Secondary | ICD-10-CM | POA: Diagnosis not present

## 2024-09-23 LAB — COLOGUARD
COLOGUARD: NEGATIVE
Cologuard: NEGATIVE

## 2024-10-03 ENCOUNTER — Other Ambulatory Visit: Payer: Self-pay | Admitting: Family

## 2024-10-10 ENCOUNTER — Other Ambulatory Visit: Payer: Self-pay | Admitting: Nurse Practitioner

## 2024-10-10 MED ORDER — ALBUTEROL SULFATE HFA 108 (90 BASE) MCG/ACT IN AERS: 2.0000 | INHALATION_SPRAY | RESPIRATORY_TRACT | 0 refills | Status: DC | PRN

## 2024-10-10 NOTE — Telephone Encounter (Unsigned)
 Copied from CRM [id number]. Topic: Clinical - Medication Refill >> Oct 10, 2024  9:34 AM Viola F wrote: Medication: albuterol  (VENTOLIN  HFA) 108 (90 Base) MCG/ACT inhaler [[id number]]  Has the patient contacted their pharmacy? Yes (Agent: If no, request that the patient contact the pharmacy for the refill. If patient does not wish to contact the pharmacy document the reason why and proceed with request.) (Agent: If yes, when and what did the pharmacy advise?)  This is the patient's preferred pharmacy:  [facility] [address] [place] - [address] [place] [address] Phone: [phone] Fax: [phone]  Is this the correct pharmacy for this prescription? Yes If no, delete pharmacy and type the correct one.   Has the prescription been filled recently? Yes  Is the patient out of the medication? Yes  Has the patient been seen for an appointment in the last year OR does the patient have an upcoming appointment? Yes  Can we respond through MyChart? No  Agent: Please be advised that Rx refills may take up to 3 business days. We ask that you follow-up with your pharmacy.

## 2024-10-18 ENCOUNTER — Encounter: Payer: Self-pay | Admitting: Nurse Practitioner

## 2024-10-18 ENCOUNTER — Telehealth: Payer: Self-pay | Admitting: Internal Medicine

## 2024-10-18 NOTE — Telephone Encounter (Signed)
 Patient has no voice mail set up or MyChart. Letter mailed out tha 1/2/226 appointment has been changed to 11/20/2024 @ 2:40 pm.

## 2024-10-24 NOTE — Progress Notes (Unsigned)
     Elija Mccamish T. Jordynne Mccown, MD, CAQ Sports Medicine [facility] [address] [place], [address]  Phone: [phone]  FAX: [phone]  Kurt [name] - 68 y.o. male  MRN [id number]  Date of Birth: 07/16/1956  Date: 10/25/2024  PCP: Jimmy Charlie FERNS, MD  Referral: Jimmy Charlie FERNS, MD  No chief complaint on file.  Subjective:   Kurt [name] is a 68 y.o. very pleasant male patient with There is no height or weight on file to calculate BMI. who presents with the following:  Discussed the use of AI scribe software for clinical note transcription with the patient, who gave verbal consent to proceed.  Patient presents for ongoing left-sided hip pain.  He does have rheumatoid arthritis.  He was just seen in the ER and at rheumatology for ongoing hip pain. -He is on methotrexate. -He actually had a GTB injection on September 13, 2024. History of Present Illness     Review of Systems is noted in the HPI, as appropriate  Objective:   There were no vitals taken for this visit.  GEN: No acute distress; alert,appropriate. PULM: Breathing comfortably in no respiratory distress PSYCH: Normally interactive.   Laboratory and Imaging Data:  Assessment and Plan:   No diagnosis found. Assessment & Plan   Medication Management during today's office visit: No orders of the defined types were placed in this encounter.  There are no discontinued medications.  Orders placed today for conditions managed today: No orders of the defined types were placed in this encounter.   Disposition: No follow-ups on file.  [employer] One speech-to-text software was used for transcription in this dictation.  Possible transcriptional errors can occur using Animal nutritionist.   Signed,  Jacques DASEN. Katty Fretwell, MD   Outpatient Encounter Medications as of 10/25/2024  Medication Sig   albuterol  (PROVENTIL ) (2.5 MG/3ML) 0.083% nebulizer solution Take 3 mLs  (2.5 mg total) by nebulization every 6 (six) hours as needed for wheezing or shortness of breath.   albuterol  (VENTOLIN  HFA) 108 (90 Base) MCG/ACT inhaler Inhale 2 puffs into the lungs every 4 (four) hours as needed.   aspirin  81 MG EC tablet Take 1 tablet by mouth daily at 2 PM.   folic acid  (FOLVITE ) 1 MG tablet Take 1 tablet by mouth once daily   lisinopril  (ZESTRIL ) 5 MG tablet Take 1 tablet by mouth once daily   methotrexate (RHEUMATREX) 2.5 MG tablet Take 17.5 mg by mouth once a week. Caution:Chemotherapy. Protect from light.   metoprolol  tartrate (LOPRESSOR ) 50 MG tablet Take 1 tablet by mouth twice daily   Multiple Vitamin (MULTIVITAMIN WITH MINERALS) TABS tablet Take 1 tablet by mouth daily.   nicotine  (NICODERM CQ  - DOSED IN MG/24 HOURS) 21 mg/24hr patch Place 1 patch (21 mg total) onto the skin daily.   predniSONE  (DELTASONE ) 20 MG tablet Take 2 tablets (40 mg total) by mouth daily. For 3 days, then 1 tab daily for 3 days   rosuvastatin  (CRESTOR ) 40 MG tablet TAKE 1 TABLET BY MOUTH ONCE DAILY AT  6PM   traZODone (DESYREL) 50 MG tablet Take 50 mg by mouth at bedtime.   TRELEGY ELLIPTA 100-62.5-25 MCG/ACT AEPB Inhale 1 puff into the lungs daily.   No facility-administered encounter medications on file as of 10/25/2024.

## 2024-10-25 ENCOUNTER — Ambulatory Visit (INDEPENDENT_AMBULATORY_CARE_PROVIDER_SITE_OTHER): Admitting: Family Medicine

## 2024-10-25 ENCOUNTER — Encounter: Payer: Self-pay | Admitting: Family Medicine

## 2024-10-25 VITALS — BP 158/88 | HR 79 | Temp 98.6°F | Ht 67.0 in | Wt 187.5 lb

## 2024-10-25 DIAGNOSIS — G8929 Other chronic pain: Secondary | ICD-10-CM

## 2024-10-25 DIAGNOSIS — M25552 Pain in left hip: Secondary | ICD-10-CM

## 2024-10-25 DIAGNOSIS — M7062 Trochanteric bursitis, left hip: Secondary | ICD-10-CM

## 2024-10-25 DIAGNOSIS — Z23 Encounter for immunization: Secondary | ICD-10-CM | POA: Diagnosis not present

## 2024-10-25 DIAGNOSIS — J441 Chronic obstructive pulmonary disease with (acute) exacerbation: Secondary | ICD-10-CM | POA: Diagnosis not present

## 2024-10-25 MED ORDER — PREDNISONE 20 MG PO TABS: ORAL_TABLET | ORAL | 0 refills | Status: DC

## 2024-10-25 MED ORDER — ALBUTEROL SULFATE HFA 108 (90 BASE) MCG/ACT IN AERS
2.0000 | INHALATION_SPRAY | RESPIRATORY_TRACT | 5 refills | Status: AC | PRN
Start: 2024-10-25 — End: ?

## 2024-11-27 ENCOUNTER — Encounter: Admitting: Nurse Practitioner

## 2024-12-01 ENCOUNTER — Encounter: Admitting: Nurse Practitioner

## 2024-12-07 ENCOUNTER — Ambulatory Visit: Admitting: General Practice

## 2024-12-07 ENCOUNTER — Ambulatory Visit: Payer: Self-pay | Admitting: Family Medicine

## 2024-12-07 ENCOUNTER — Encounter: Payer: Self-pay | Admitting: General Practice

## 2024-12-07 VITALS — BP 132/84 | HR 74 | Temp 97.5°F | Ht 67.0 in | Wt 193.8 lb

## 2024-12-07 DIAGNOSIS — I739 Peripheral vascular disease, unspecified: Secondary | ICD-10-CM

## 2024-12-07 DIAGNOSIS — J449 Chronic obstructive pulmonary disease, unspecified: Secondary | ICD-10-CM | POA: Diagnosis not present

## 2024-12-07 DIAGNOSIS — M069 Rheumatoid arthritis, unspecified: Secondary | ICD-10-CM | POA: Diagnosis not present

## 2024-12-07 DIAGNOSIS — I10 Essential (primary) hypertension: Secondary | ICD-10-CM | POA: Diagnosis not present

## 2024-12-07 DIAGNOSIS — K219 Gastro-esophageal reflux disease without esophagitis: Secondary | ICD-10-CM

## 2024-12-07 DIAGNOSIS — I2510 Atherosclerotic heart disease of native coronary artery without angina pectoris: Secondary | ICD-10-CM

## 2024-12-07 DIAGNOSIS — Z7689 Persons encountering health services in other specified circumstances: Secondary | ICD-10-CM

## 2024-12-07 NOTE — Progress Notes (Signed)
 "  New Patient Office Visit  Subjective    Patient ID: Kurt [name], male    DOB: 1956/09/26  Age: 69 y.o. MRN: [id number]  CC:  Chief Complaint  Patient presents with   New Patient (Initial Visit)    TOC from Dr. Jimmy    HPI Kurt [name] is a 69 y.o. male presents to establish care. Previous pcp- Dr. Jimmy  Discussed the use of AI scribe software for clinical note transcription with the patient, who gave verbal consent to proceed.  History of Present Illness Kurt [name] is a 69 year old male who presents for an established care visit.  He has a history of COPD and is currently using albuterol  as needed, a nebulizer if necessary, and Trelegy (100 mcg) one puff daily. He seldom needs to use albuterol  unless engaging in strenuous activities. He follows up with a pulmonologist for his COPD management.  He has a history of coronary artery disease and experienced a heart attack on November 23, 2019. Since then, he has been on lisinopril  5 mg, metoprolol  50 mg twice daily, rosuvastatin  (Crestor ) once daily, and a baby aspirin . No current chest pain is reported, and he follows up with cardiology annually, with his last visit in June. Recently, a insurance nurse placed a Zio patch on his chest for cardiac monitoring, which he is to wear for 14 days and return to them.   He has rheumatoid arthritis and is under the care of a specialist, with an upcoming appointment next week. He is on methotrexate for management.  He is a smoker and acknowledges the need to cut back. He is considering using nicotine  patches to aid in smoking cessation. He also consumes alcohol , particularly whiskey and Coke, which he associates with smoking.  He underwent a PAD screening last year with home nurse through insurance, which showed concerning results with scores of 0.14 and 0.26, indicating severe occlusion.   He has a history of lung nodules, with a CT scan last September showing a nodule that had  shrunk. He was advised to repeat the lung cancer screening in six months due to the nodule.  He is on social security disability and is pursuing [facility] benefits. A recent [facility] a [facility] facility prompted a recommendation to have his ears checked by his primary care provider.       Outpatient Encounter Medications as of 12/07/2024  Medication Sig   albuterol  (PROVENTIL ) (2.5 MG/3ML) 0.083% nebulizer solution Take 3 mLs (2.5 mg total) by nebulization every 6 (six) hours as needed for wheezing or shortness of breath.   albuterol  (VENTOLIN  HFA) 108 (90 Base) MCG/ACT inhaler Inhale 2 puffs into the lungs every 4 (four) hours as needed.   aspirin  81 MG EC tablet Take 1 tablet by mouth daily at 2 PM.   folic acid  (FOLVITE ) 1 MG tablet Take 1 tablet by mouth once daily   lisinopril  (ZESTRIL ) 5 MG tablet Take 1 tablet by mouth once daily   methotrexate (RHEUMATREX) 2.5 MG tablet Take 17.5 mg by mouth once a week. Caution:Chemotherapy. Protect from light.   metoprolol  tartrate (LOPRESSOR ) 50 MG tablet Take 1 tablet by mouth twice daily   Multiple Vitamin (MULTIVITAMIN WITH MINERALS) TABS tablet Take 1 tablet by mouth daily.   nicotine  (NICODERM CQ  - DOSED IN MG/24 HOURS) 21 mg/24hr patch Place 1 patch (21 mg total) onto the skin daily.   rosuvastatin  (CRESTOR ) 40 MG tablet TAKE 1 TABLET BY MOUTH ONCE DAILY AT  [facility]  TRELEGY ELLIPTA 100-62.5-25 MCG/ACT AEPB Inhale 1 puff into the lungs daily.   [DISCONTINUED] predniSONE  (DELTASONE ) 20 MG tablet 2 tabs po daily for 5 days, then 1 tab po daily for 5 days (Patient not taking: Reported on 12/07/2024)   No facility-administered encounter medications on file as of 12/07/2024.    Past Medical History:  Diagnosis Date   COPD (chronic obstructive pulmonary disease) (HCC)    ED (erectile dysfunction)    Hypertension    Lumbar disc disease    Myocardial infarct (HCC)    TB lung, latent    Took a pill for 1 year in 1977    Past Surgical History:  Procedure  Laterality Date   APPENDECTOMY     CATARACT EXTRACTION W/PHACO Right 04/15/2021   Procedure: CATARACT EXTRACTION PHACO AND INTRAOCULAR LENS PLACEMENT (IOC) RIGHT;  Surgeon: Jaye Fallow, MD;  Location: [facility];  Service: Ophthalmology;  Laterality: Right;  7.82 0:42.9   CORONARY STENT INTERVENTION N/A 11/23/2019   Procedure: CORONARY STENT INTERVENTION;  Surgeon: Florencio Cara BIRCH, MD;  Location: [facility];  Service: Cardiovascular;  Laterality: N/A;  RCA   CORONARY/GRAFT ACUTE MI REVASCULARIZATION N/A 11/23/2019   Procedure: Coronary/Graft Acute MI Revascularization;  Surgeon: Florencio Cara BIRCH, MD;  Location: [facility];  Service: Cardiovascular;  Laterality: N/A;   LEFT HEART CATH AND CORONARY ANGIOGRAPHY N/A 11/23/2019   Procedure: LEFT HEART CATH AND CORONARY ANGIOGRAPHY;  Surgeon: Florencio Cara BIRCH, MD;  Location: [facility];  Service: Cardiovascular;  Laterality: N/A;    Family History  Problem Relation Age of Onset   Hyperlipidemia Father    Cancer Father    Heart disease Father        heart valve disease (from Agent Orange?)   Alcohol  abuse Mother    Hypertension Neg Hx    Diabetes Neg Hx    Colon cancer Neg Hx    Colon polyps Neg Hx    Pancreatic cancer Neg Hx    Rectal cancer Neg Hx    Stomach cancer Neg Hx     Social History   Socioeconomic History   Marital status: Widowed    Spouse name: Not on file   Number of children: 1   Years of education: Not on file   Highest education level: Not on file  Occupational History   Occupation: disabled  Tobacco Use   Smoking status: Every Day    Current packs/day: 1.00    Average packs/day: 1 pack/day for 46.0 years (46.0 ttl pk-yrs)    Types: Cigarettes    Passive exposure: Past   Smokeless tobacco: Never  Vaping Use   Vaping status: Former   Quit date: 11/30/2016  Substance and Sexual Activity   Alcohol  use: Yes    Alcohol /week: 4.0 standard drinks of alcohol      Types: 4 Standard drinks or equivalent per week   Drug use: Yes    Types: Marijuana   Sexual activity: Not on file  Other Topics Concern   Not on file  Social History Narrative   Widowed 2020      No health care directive   Son would be health care POA   Would accept resuscitation   Not sure about life support--but not if cognitively unaware   Social Drivers of Health   Tobacco Use: High Risk (12/07/2024)   Patient History    Smoking Tobacco Use: Every Day    Smokeless Tobacco Use: Never    Passive Exposure:  Past  Financial Resource Strain: Not on file  Food Insecurity: Not on file  Transportation Needs: Not on file  Physical Activity: Not on file  Stress: Not on file  Social Connections: Unknown (04/14/2022)   Received [facility]   Social Network    Social Network: Not on file  Intimate Partner Violence: Unknown (03/06/2022)   Received from [facility]   HITS    Physically Hurt: Not on file    Insult or Talk Down To: Not on file    Threaten Physical Harm: Not on file    Scream or Curse: Not on file  Depression (PHQ2-9): Low Risk (12/07/2024)   Depression (PHQ2-9)    PHQ-2 Score: 4  Alcohol  Screen: Not on file  Housing: Unknown (12/06/2023)   Received from [facility]   Epic    At any time in the past 12 months, were you homeless or living in a shelter (including now)?: No    Number of Times Moved in the Last Year: Not on file    Unable to Pay for Housing in the Last Year: Not on file  Utilities: Not on file  Health Literacy: Not on file    Review of Systems  Constitutional:  Negative for chills and fever.  Respiratory:  Negative for shortness of breath.   Cardiovascular:  Negative for chest pain.  Gastrointestinal:  Negative for abdominal pain, constipation, diarrhea, heartburn, nausea and vomiting.  Genitourinary:  Negative for dysuria, frequency and urgency.  Neurological:  Negative for dizziness and headaches.  Endo/Heme/Allergies:   Negative for polydipsia.  Psychiatric/Behavioral:  Negative for depression and suicidal ideas. The patient is not nervous/anxious.         Objective    BP 132/84   Pulse 74   Temp (!) 97.5 F (36.4 C) (Temporal)   Ht 5' 7 (1.702 m)   Wt 193 lb 12.8 oz (87.9 kg)   SpO2 95%   BMI 30.35 kg/m   Physical Exam Vitals and nursing note reviewed.  Constitutional:      Appearance: Normal appearance.  HENT:     Right Ear: Ear canal and external ear normal. Tympanic membrane is retracted.     Left Ear: Tympanic membrane, ear canal and external ear normal.  Cardiovascular:     Rate and Rhythm: Normal rate and regular rhythm.     Pulses: Normal pulses.     Heart sounds: Normal heart sounds.  Pulmonary:     Effort: Pulmonary effort is normal.     Breath sounds: Normal breath sounds.  Neurological:     Mental Status: He is alert and oriented to person, place, and time.  Psychiatric:        Mood and Affect: Mood normal.        Behavior: Behavior normal.        Thought Content: Thought content normal.        Judgment: Judgment normal.         Assessment & Plan:  Primary hypertension  Establishing care with new doctor, encounter for  Atherosclerosis of native coronary artery of native heart without angina pectoris  Chronic obstructive pulmonary disease, unspecified COPD type (HCC)  Gastroesophageal reflux disease without esophagitis  Rheumatoid arthritis involving both hands, unspecified whether rheumatoid factor present (HCC)  PAD (peripheral artery disease) -     Ambulatory referral to Vascular Surgery    Assessment and Plan Assessment & Plan Peripheral artery disease with severe occlusion Severe occlusion with ABI scores of  0.14 and 0.26. Smoking cessation critical. - Urgent referral to vascular specialist in [place]. - Advised significant reduction in smoking.  Atherosclerotic heart disease with prior myocardial infarction History of myocardial infarction  in 2020. No current chest pain. Zio patch monitoring initiated. - Continue lisinopril , metoprolol , rosuvastatin . - Follow up with cardiology as scheduled.  Primary hypertension Blood pressure slightly elevated. Managed with lisinopril  and metoprolol . - Continue current antihypertensive regimen.  Chronic obstructive pulmonary disease Managed with Trelegy and albuterol . Rare albuterol  use unless strenuous activity. Pulmonology follow-up ongoing. - Continue Trelegy and albuterol  as needed. - Continue follow-up with pulmonology in May.  Rheumatoid arthritis Managed by rheumatologist Dr. Tobie. Upcoming appointment scheduled. - Continue follow-up with rheumatology.  Nicotine  dependence, cigarettes Continues smoking despite risks. Previous Chantix  attempt unsuccessful. Smoking cessation critical due to PAD and COPD. - Advised smoking cessation strategies, including reducing cigarette consumption. - Discussed potential use of nicotine  patches.  Solitary pulmonary nodule under surveillance Pulmonary nodule decreased in size. Follow-up imaging due sooner per protocol. - Call and schedule lung CT scan for follow-up of pulmonary nodule.  General health maintenance Routine health maintenance discussed. Cologuard completed. Lung cancer screening due sooner due to nodule surveillance. - Schedule lung CT scan for pulmonary nodule surveillance. - Schedule Medicare wellness visit with nurse. - Ensure follow-up with cardiology and pulmonology as scheduled.    Return in about 2 months (around 02/04/2025) for physical and fasting labs. SABRA Carrol Aurora, NP  "

## 2024-12-07 NOTE — Patient Instructions (Addendum)
 You will either be contacted via phone regarding your referral to vascular , or you may receive a letter on your MyChart portal from our referral team with instructions for scheduling an appointment. Please let us  know if you have not been contacted by anyone within two weeks.   As discussed, please cut back on smoking.   Call and schedule lung ct scan.   Follow up in march for physical and labs.   Schedule medicare wellness visit with nurse over the phone.   It was a pleasure meeting you!

## 2025-01-04 ENCOUNTER — Other Ambulatory Visit (INDEPENDENT_AMBULATORY_CARE_PROVIDER_SITE_OTHER): Payer: Self-pay | Admitting: Nurse Practitioner

## 2025-01-04 DIAGNOSIS — I739 Peripheral vascular disease, unspecified: Secondary | ICD-10-CM

## 2025-01-05 ENCOUNTER — Encounter: Payer: Self-pay | Admitting: Acute Care

## 2025-01-08 ENCOUNTER — Encounter (INDEPENDENT_AMBULATORY_CARE_PROVIDER_SITE_OTHER)

## 2025-01-08 ENCOUNTER — Encounter (INDEPENDENT_AMBULATORY_CARE_PROVIDER_SITE_OTHER): Payer: Self-pay | Admitting: Nurse Practitioner

## 2025-02-05 ENCOUNTER — Encounter: Admitting: General Practice
